# Patient Record
Sex: Male | Born: 1960 | Race: White | Hispanic: No | Marital: Married | Smoking: Former smoker
Health system: Southern US, Community
[De-identification: ages and names within clinical notes are randomized; demographics above are authoritative.]

## PROBLEM LIST (undated history)

## (undated) ENCOUNTER — Emergency Department (HOSPITAL_COMMUNITY): Admission: EM | Disposition: A | Discharge status: Home / Self Care | Payer: Self-pay

## (undated) DIAGNOSIS — M545 Low back pain, unspecified: Secondary | ICD-10-CM

## (undated) DIAGNOSIS — I43 Cardiomyopathy in diseases classified elsewhere: Secondary | ICD-10-CM

## (undated) DIAGNOSIS — I2510 Atherosclerotic heart disease of native coronary artery without angina pectoris: Secondary | ICD-10-CM

## (undated) DIAGNOSIS — E079 Disorder of thyroid, unspecified: Secondary | ICD-10-CM

## (undated) DIAGNOSIS — S0291XA Unspecified fracture of skull, initial encounter for closed fracture: Secondary | ICD-10-CM

## (undated) DIAGNOSIS — G8929 Other chronic pain: Secondary | ICD-10-CM

## (undated) DIAGNOSIS — Z9119 Patient's noncompliance with other medical treatment and regimen: Secondary | ICD-10-CM

## (undated) DIAGNOSIS — I6529 Occlusion and stenosis of unspecified carotid artery: Secondary | ICD-10-CM

## (undated) DIAGNOSIS — F1910 Other psychoactive substance abuse, uncomplicated: Secondary | ICD-10-CM

## (undated) DIAGNOSIS — I10 Essential (primary) hypertension: Secondary | ICD-10-CM

## (undated) DIAGNOSIS — M1990 Unspecified osteoarthritis, unspecified site: Secondary | ICD-10-CM

## (undated) DIAGNOSIS — Z91199 Patient's noncompliance with other medical treatment and regimen due to unspecified reason: Secondary | ICD-10-CM

## (undated) DIAGNOSIS — F419 Anxiety disorder, unspecified: Secondary | ICD-10-CM

## (undated) DIAGNOSIS — I219 Acute myocardial infarction, unspecified: Secondary | ICD-10-CM

## (undated) DIAGNOSIS — K219 Gastro-esophageal reflux disease without esophagitis: Secondary | ICD-10-CM

## (undated) DIAGNOSIS — I255 Ischemic cardiomyopathy: Secondary | ICD-10-CM

## (undated) DIAGNOSIS — I7101 Dissection of thoracic aorta: Secondary | ICD-10-CM

## (undated) DIAGNOSIS — Z9289 Personal history of other medical treatment: Secondary | ICD-10-CM

## (undated) DIAGNOSIS — I639 Cerebral infarction, unspecified: Secondary | ICD-10-CM

## (undated) DIAGNOSIS — G43909 Migraine, unspecified, not intractable, without status migrainosus: Secondary | ICD-10-CM

## (undated) DIAGNOSIS — I119 Hypertensive heart disease without heart failure: Secondary | ICD-10-CM

## (undated) HISTORY — DX: Ischemic cardiomyopathy: I25.5

## (undated) HISTORY — DX: Other psychoactive substance abuse, uncomplicated: F19.10

## (undated) HISTORY — DX: Personal history of other medical treatment: Z92.89

## (undated) HISTORY — DX: Occlusion and stenosis of unspecified carotid artery: I65.29

---
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
## 1987-01-22 HISTORY — PX: PATELLA FRACTURE SURGERY: SHX735

## 1987-01-22 HISTORY — PX: BACK SURGERY: SHX140

## 1988-09-21 DIAGNOSIS — I639 Cerebral infarction, unspecified: Secondary | ICD-10-CM

## 1988-09-21 HISTORY — DX: Cerebral infarction, unspecified: I63.9

## 1999-03-20 ENCOUNTER — Emergency Department (HOSPITAL_COMMUNITY): Admission: EM | Admit: 1999-03-20 | Discharge: 1999-03-20 | Payer: Self-pay | Admitting: *Deleted

## 1999-03-20 ENCOUNTER — Encounter: Payer: Self-pay | Admitting: Emergency Medicine

## 1999-03-20 ENCOUNTER — Encounter: Payer: Self-pay | Admitting: *Deleted

## 1999-03-27 ENCOUNTER — Emergency Department (HOSPITAL_COMMUNITY): Admission: EM | Admit: 1999-03-27 | Discharge: 1999-03-27 | Payer: Self-pay | Admitting: Emergency Medicine

## 1999-03-27 ENCOUNTER — Encounter: Payer: Self-pay | Admitting: Emergency Medicine

## 1999-05-17 ENCOUNTER — Emergency Department (HOSPITAL_COMMUNITY): Admission: EM | Admit: 1999-05-17 | Discharge: 1999-05-17 | Payer: Self-pay | Admitting: Emergency Medicine

## 1999-05-18 ENCOUNTER — Encounter: Payer: Self-pay | Admitting: Emergency Medicine

## 1999-06-04 ENCOUNTER — Encounter: Payer: Self-pay | Admitting: Emergency Medicine

## 1999-06-04 ENCOUNTER — Emergency Department (HOSPITAL_COMMUNITY): Admission: EM | Admit: 1999-06-04 | Discharge: 1999-06-04 | Payer: Self-pay | Admitting: Emergency Medicine

## 1999-09-05 ENCOUNTER — Emergency Department (HOSPITAL_COMMUNITY): Admission: EM | Admit: 1999-09-05 | Discharge: 1999-09-05 | Payer: Self-pay | Admitting: Emergency Medicine

## 1999-09-05 ENCOUNTER — Encounter: Payer: Self-pay | Admitting: Emergency Medicine

## 1999-09-19 ENCOUNTER — Emergency Department (HOSPITAL_COMMUNITY): Admission: EM | Admit: 1999-09-19 | Discharge: 1999-09-19 | Payer: Self-pay | Admitting: Emergency Medicine

## 1999-10-23 ENCOUNTER — Emergency Department (HOSPITAL_COMMUNITY): Admission: EM | Admit: 1999-10-23 | Discharge: 1999-10-23 | Payer: Self-pay | Admitting: Emergency Medicine

## 1999-11-28 ENCOUNTER — Emergency Department (HOSPITAL_COMMUNITY): Admission: EM | Admit: 1999-11-28 | Discharge: 1999-11-28 | Payer: Self-pay | Admitting: Emergency Medicine

## 1999-11-28 ENCOUNTER — Encounter: Payer: Self-pay | Admitting: Emergency Medicine

## 2000-12-30 ENCOUNTER — Emergency Department (HOSPITAL_COMMUNITY): Admission: EM | Admit: 2000-12-30 | Discharge: 2000-12-30 | Payer: Self-pay | Admitting: Emergency Medicine

## 2001-05-02 ENCOUNTER — Encounter: Payer: Self-pay | Admitting: Emergency Medicine

## 2001-05-02 ENCOUNTER — Emergency Department (HOSPITAL_COMMUNITY): Admission: EM | Admit: 2001-05-02 | Discharge: 2001-05-02 | Payer: Self-pay | Admitting: Emergency Medicine

## 2001-10-29 ENCOUNTER — Emergency Department (HOSPITAL_COMMUNITY): Admission: EM | Admit: 2001-10-29 | Discharge: 2001-10-29 | Payer: Self-pay | Admitting: *Deleted

## 2001-11-23 ENCOUNTER — Emergency Department (HOSPITAL_COMMUNITY): Admission: EM | Admit: 2001-11-23 | Discharge: 2001-11-24 | Payer: Self-pay | Admitting: Emergency Medicine

## 2001-11-24 ENCOUNTER — Encounter: Payer: Self-pay | Admitting: Emergency Medicine

## 2001-11-30 ENCOUNTER — Encounter: Payer: Self-pay | Admitting: Emergency Medicine

## 2001-11-30 ENCOUNTER — Ambulatory Visit (HOSPITAL_COMMUNITY): Admission: RE | Admit: 2001-11-30 | Discharge: 2001-11-30 | Payer: Self-pay | Admitting: Emergency Medicine

## 2001-11-30 ENCOUNTER — Emergency Department (HOSPITAL_COMMUNITY): Admission: EM | Admit: 2001-11-30 | Discharge: 2001-12-01 | Payer: Self-pay

## 2001-12-31 ENCOUNTER — Encounter: Admission: RE | Admit: 2001-12-31 | Discharge: 2002-02-01 | Payer: Self-pay | Admitting: *Deleted

## 2002-01-02 ENCOUNTER — Emergency Department (HOSPITAL_COMMUNITY): Admission: EM | Admit: 2002-01-02 | Discharge: 2002-01-02 | Payer: Self-pay | Admitting: Emergency Medicine

## 2002-01-18 ENCOUNTER — Encounter: Admission: RE | Admit: 2002-01-18 | Discharge: 2002-01-18 | Payer: Self-pay | Admitting: Internal Medicine

## 2002-01-28 ENCOUNTER — Encounter: Admission: RE | Admit: 2002-01-28 | Discharge: 2002-01-28 | Payer: Self-pay | Admitting: Internal Medicine

## 2002-02-23 ENCOUNTER — Encounter: Admission: RE | Admit: 2002-02-23 | Discharge: 2002-02-23 | Payer: Self-pay | Admitting: Internal Medicine

## 2002-03-16 ENCOUNTER — Encounter: Admission: RE | Admit: 2002-03-16 | Discharge: 2002-03-16 | Payer: Self-pay | Admitting: Internal Medicine

## 2002-03-26 ENCOUNTER — Emergency Department (HOSPITAL_COMMUNITY): Admission: EM | Admit: 2002-03-26 | Discharge: 2002-03-26 | Payer: Self-pay | Admitting: Emergency Medicine

## 2002-04-09 ENCOUNTER — Encounter: Admission: RE | Admit: 2002-04-09 | Discharge: 2002-04-09 | Payer: Self-pay | Admitting: Internal Medicine

## 2002-04-23 ENCOUNTER — Encounter: Admission: RE | Admit: 2002-04-23 | Discharge: 2002-04-23 | Payer: Self-pay | Admitting: Internal Medicine

## 2002-05-05 ENCOUNTER — Encounter: Admission: RE | Admit: 2002-05-05 | Discharge: 2002-05-05 | Payer: Self-pay | Admitting: Orthopedic Surgery

## 2002-05-05 ENCOUNTER — Encounter: Payer: Self-pay | Admitting: Orthopedic Surgery

## 2002-05-10 ENCOUNTER — Encounter: Admission: RE | Admit: 2002-05-10 | Discharge: 2002-05-10 | Payer: Self-pay | Admitting: Internal Medicine

## 2002-05-20 ENCOUNTER — Encounter: Payer: Self-pay | Admitting: Orthopedic Surgery

## 2002-05-20 ENCOUNTER — Encounter: Admission: RE | Admit: 2002-05-20 | Discharge: 2002-05-20 | Payer: Self-pay | Admitting: Orthopedic Surgery

## 2002-06-04 ENCOUNTER — Encounter: Payer: Self-pay | Admitting: Orthopedic Surgery

## 2002-06-04 ENCOUNTER — Encounter: Admission: RE | Admit: 2002-06-04 | Discharge: 2002-06-04 | Payer: Self-pay | Admitting: Orthopedic Surgery

## 2002-06-16 ENCOUNTER — Encounter: Admission: RE | Admit: 2002-06-16 | Discharge: 2002-06-16 | Payer: Self-pay | Admitting: Internal Medicine

## 2002-07-01 ENCOUNTER — Emergency Department (HOSPITAL_COMMUNITY): Admission: EM | Admit: 2002-07-01 | Discharge: 2002-07-01 | Payer: Self-pay | Admitting: Emergency Medicine

## 2002-07-07 ENCOUNTER — Encounter: Admission: RE | Admit: 2002-07-07 | Discharge: 2002-07-07 | Payer: Self-pay | Admitting: Internal Medicine

## 2002-07-07 ENCOUNTER — Encounter: Payer: Self-pay | Admitting: *Deleted

## 2002-07-07 ENCOUNTER — Emergency Department (HOSPITAL_COMMUNITY): Admission: EM | Admit: 2002-07-07 | Discharge: 2002-07-07 | Payer: Self-pay | Admitting: Emergency Medicine

## 2002-07-08 ENCOUNTER — Encounter: Admission: RE | Admit: 2002-07-08 | Discharge: 2002-07-08 | Payer: Self-pay | Admitting: Internal Medicine

## 2002-07-12 ENCOUNTER — Encounter: Admission: RE | Admit: 2002-07-12 | Discharge: 2002-07-12 | Payer: Self-pay | Admitting: Internal Medicine

## 2002-08-06 ENCOUNTER — Encounter: Admission: RE | Admit: 2002-08-06 | Discharge: 2002-08-06 | Payer: Self-pay | Admitting: Internal Medicine

## 2002-08-06 ENCOUNTER — Ambulatory Visit (HOSPITAL_COMMUNITY): Admission: RE | Admit: 2002-08-06 | Discharge: 2002-08-06 | Payer: Self-pay | Admitting: Internal Medicine

## 2002-08-20 ENCOUNTER — Encounter: Admission: RE | Admit: 2002-08-20 | Discharge: 2002-08-20 | Payer: Self-pay | Admitting: Internal Medicine

## 2002-09-09 ENCOUNTER — Encounter: Admission: RE | Admit: 2002-09-09 | Discharge: 2002-09-09 | Payer: Self-pay | Admitting: Internal Medicine

## 2002-09-22 ENCOUNTER — Encounter: Admission: RE | Admit: 2002-09-22 | Discharge: 2002-09-22 | Payer: Self-pay | Admitting: Internal Medicine

## 2002-10-08 ENCOUNTER — Emergency Department (HOSPITAL_COMMUNITY): Admission: EM | Admit: 2002-10-08 | Discharge: 2002-10-08 | Payer: Self-pay | Admitting: Emergency Medicine

## 2002-10-14 ENCOUNTER — Encounter: Payer: Self-pay | Admitting: Emergency Medicine

## 2002-10-14 ENCOUNTER — Encounter: Admission: RE | Admit: 2002-10-14 | Discharge: 2002-10-14 | Payer: Self-pay | Admitting: Internal Medicine

## 2002-10-14 ENCOUNTER — Emergency Department (HOSPITAL_COMMUNITY): Admission: EM | Admit: 2002-10-14 | Discharge: 2002-10-14 | Payer: Self-pay | Admitting: Emergency Medicine

## 2002-10-21 ENCOUNTER — Encounter: Admission: RE | Admit: 2002-10-21 | Discharge: 2002-10-21 | Payer: Self-pay | Admitting: Internal Medicine

## 2002-10-28 ENCOUNTER — Emergency Department (HOSPITAL_COMMUNITY): Admission: AD | Admit: 2002-10-28 | Discharge: 2002-10-28 | Payer: Self-pay | Admitting: Family Medicine

## 2002-11-05 ENCOUNTER — Emergency Department (HOSPITAL_COMMUNITY): Admission: AD | Admit: 2002-11-05 | Discharge: 2002-11-05 | Payer: Self-pay | Admitting: Family Medicine

## 2002-11-06 ENCOUNTER — Emergency Department (HOSPITAL_COMMUNITY): Admission: AD | Admit: 2002-11-06 | Discharge: 2002-11-06 | Payer: Self-pay | Admitting: Family Medicine

## 2002-11-18 ENCOUNTER — Encounter: Admission: RE | Admit: 2002-11-18 | Discharge: 2002-11-18 | Payer: Self-pay | Admitting: Internal Medicine

## 2002-11-18 ENCOUNTER — Emergency Department (HOSPITAL_COMMUNITY): Admission: AD | Admit: 2002-11-18 | Discharge: 2002-11-18 | Payer: Self-pay | Admitting: Family Medicine

## 2002-11-25 ENCOUNTER — Encounter: Admission: RE | Admit: 2002-11-25 | Discharge: 2002-11-25 | Payer: Self-pay | Admitting: Internal Medicine

## 2002-11-25 ENCOUNTER — Ambulatory Visit (HOSPITAL_COMMUNITY): Admission: RE | Admit: 2002-11-25 | Discharge: 2002-11-25 | Payer: Self-pay | Admitting: Internal Medicine

## 2003-01-12 ENCOUNTER — Encounter: Admission: RE | Admit: 2003-01-12 | Discharge: 2003-01-12 | Payer: Self-pay | Admitting: Internal Medicine

## 2003-02-22 ENCOUNTER — Encounter: Admission: RE | Admit: 2003-02-22 | Discharge: 2003-02-22 | Payer: Self-pay | Admitting: Internal Medicine

## 2003-08-13 ENCOUNTER — Emergency Department (HOSPITAL_COMMUNITY): Admission: AC | Admit: 2003-08-13 | Discharge: 2003-08-13 | Payer: Self-pay | Admitting: *Deleted

## 2003-08-21 ENCOUNTER — Emergency Department (HOSPITAL_COMMUNITY): Admission: EM | Admit: 2003-08-21 | Discharge: 2003-08-21 | Payer: Self-pay | Admitting: Emergency Medicine

## 2003-08-24 ENCOUNTER — Emergency Department (HOSPITAL_COMMUNITY): Admission: EM | Admit: 2003-08-24 | Discharge: 2003-08-24 | Payer: Self-pay | Admitting: Internal Medicine

## 2003-08-24 ENCOUNTER — Emergency Department (HOSPITAL_COMMUNITY): Admission: EM | Admit: 2003-08-24 | Discharge: 2003-08-24 | Payer: Self-pay | Admitting: Emergency Medicine

## 2003-12-06 ENCOUNTER — Emergency Department (HOSPITAL_COMMUNITY): Admission: RE | Admit: 2003-12-06 | Discharge: 2003-12-06 | Payer: Self-pay | Admitting: Family Medicine

## 2004-06-13 ENCOUNTER — Emergency Department (HOSPITAL_COMMUNITY): Admission: EM | Admit: 2004-06-13 | Discharge: 2004-06-13 | Payer: Self-pay | Admitting: Emergency Medicine

## 2004-10-25 ENCOUNTER — Emergency Department (HOSPITAL_COMMUNITY): Admission: EM | Admit: 2004-10-25 | Discharge: 2004-10-25 | Payer: Self-pay | Admitting: Emergency Medicine

## 2004-10-31 ENCOUNTER — Emergency Department (HOSPITAL_COMMUNITY): Admission: EM | Admit: 2004-10-31 | Discharge: 2004-10-31 | Payer: Self-pay | Admitting: Emergency Medicine

## 2005-05-31 ENCOUNTER — Ambulatory Visit (HOSPITAL_COMMUNITY): Admission: RE | Admit: 2005-05-31 | Discharge: 2005-05-31 | Payer: Self-pay | Admitting: Family Medicine

## 2005-12-12 ENCOUNTER — Emergency Department (HOSPITAL_COMMUNITY): Admission: EM | Admit: 2005-12-12 | Discharge: 2005-12-12 | Payer: Self-pay | Admitting: Emergency Medicine

## 2006-01-15 ENCOUNTER — Emergency Department (HOSPITAL_COMMUNITY): Admission: EM | Admit: 2006-01-15 | Discharge: 2006-01-15 | Payer: Self-pay | Admitting: Emergency Medicine

## 2008-06-29 ENCOUNTER — Emergency Department (HOSPITAL_COMMUNITY): Admission: EM | Admit: 2008-06-29 | Discharge: 2008-06-29 | Payer: Self-pay | Admitting: Emergency Medicine

## 2008-07-05 ENCOUNTER — Emergency Department (HOSPITAL_COMMUNITY): Admission: EM | Admit: 2008-07-05 | Discharge: 2008-07-05 | Payer: Self-pay | Admitting: Emergency Medicine

## 2008-11-05 ENCOUNTER — Ambulatory Visit: Payer: Self-pay | Admitting: Diagnostic Radiology

## 2008-11-05 ENCOUNTER — Emergency Department (HOSPITAL_BASED_OUTPATIENT_CLINIC_OR_DEPARTMENT_OTHER): Admission: EM | Admit: 2008-11-05 | Discharge: 2008-11-05 | Payer: Self-pay | Admitting: Emergency Medicine

## 2008-11-15 ENCOUNTER — Ambulatory Visit: Payer: Self-pay | Admitting: Diagnostic Radiology

## 2008-11-15 ENCOUNTER — Emergency Department (HOSPITAL_BASED_OUTPATIENT_CLINIC_OR_DEPARTMENT_OTHER): Admission: EM | Admit: 2008-11-15 | Discharge: 2008-11-15 | Payer: Self-pay | Admitting: Emergency Medicine

## 2008-12-13 ENCOUNTER — Emergency Department (HOSPITAL_COMMUNITY): Admission: EM | Admit: 2008-12-13 | Discharge: 2008-12-13 | Payer: Self-pay | Admitting: Emergency Medicine

## 2008-12-14 ENCOUNTER — Emergency Department (HOSPITAL_COMMUNITY): Admission: EM | Admit: 2008-12-14 | Discharge: 2008-12-14 | Payer: Self-pay | Admitting: Emergency Medicine

## 2009-06-06 ENCOUNTER — Emergency Department (HOSPITAL_BASED_OUTPATIENT_CLINIC_OR_DEPARTMENT_OTHER): Admission: EM | Admit: 2009-06-06 | Discharge: 2009-06-06 | Payer: Self-pay | Admitting: Emergency Medicine

## 2009-08-09 ENCOUNTER — Emergency Department (HOSPITAL_COMMUNITY): Admission: EM | Admit: 2009-08-09 | Discharge: 2009-08-09 | Payer: Self-pay | Admitting: Emergency Medicine

## 2009-09-26 ENCOUNTER — Emergency Department (HOSPITAL_BASED_OUTPATIENT_CLINIC_OR_DEPARTMENT_OTHER): Admission: EM | Admit: 2009-09-26 | Discharge: 2009-09-26 | Payer: Self-pay | Admitting: Emergency Medicine

## 2009-10-20 ENCOUNTER — Ambulatory Visit (HOSPITAL_COMMUNITY): Admission: RE | Admit: 2009-10-20 | Discharge: 2009-10-20 | Payer: Self-pay | Admitting: Urology

## 2009-10-20 HISTORY — PX: CYSTOSCOPY/RETROGRADE/URETEROSCOPY/STONE EXTRACTION WITH BASKET: SHX5317

## 2009-10-25 ENCOUNTER — Emergency Department (HOSPITAL_COMMUNITY): Admission: EM | Admit: 2009-10-25 | Discharge: 2009-10-25 | Payer: Self-pay | Admitting: Emergency Medicine

## 2010-03-13 ENCOUNTER — Emergency Department (HOSPITAL_COMMUNITY)
Admission: EM | Admit: 2010-03-13 | Discharge: 2010-03-13 | Disposition: A | Discharge status: Home / Self Care | Payer: Medicare Other | Attending: Emergency Medicine | Admitting: Emergency Medicine

## 2010-03-13 DIAGNOSIS — H5710 Ocular pain, unspecified eye: Secondary | ICD-10-CM | POA: Insufficient documentation

## 2010-03-13 DIAGNOSIS — R51 Headache: Secondary | ICD-10-CM | POA: Insufficient documentation

## 2010-03-13 DIAGNOSIS — H109 Unspecified conjunctivitis: Secondary | ICD-10-CM | POA: Insufficient documentation

## 2010-03-13 DIAGNOSIS — R197 Diarrhea, unspecified: Secondary | ICD-10-CM | POA: Insufficient documentation

## 2010-03-13 DIAGNOSIS — H5789 Other specified disorders of eye and adnexa: Secondary | ICD-10-CM | POA: Insufficient documentation

## 2010-04-04 LAB — URINE MICROSCOPIC-ADD ON

## 2010-04-04 LAB — URINE CULTURE: Colony Count: 15000

## 2010-04-04 LAB — DIFFERENTIAL
Basophils Absolute: 0 10*3/uL (ref 0.0–0.1)
Basophils Relative: 0 % (ref 0–1)
Lymphocytes Relative: 13 % (ref 12–46)
Monocytes Absolute: 0.7 10*3/uL (ref 0.1–1.0)
Neutro Abs: 8.5 10*3/uL — ABNORMAL HIGH (ref 1.7–7.7)
Neutrophils Relative %: 79 % — ABNORMAL HIGH (ref 43–77)

## 2010-04-04 LAB — COMPREHENSIVE METABOLIC PANEL
Albumin: 3.2 g/dL — ABNORMAL LOW (ref 3.5–5.2)
BUN: 17 mg/dL (ref 6–23)
Calcium: 8.7 mg/dL (ref 8.4–10.5)
Chloride: 104 mEq/L (ref 96–112)
Creatinine, Ser: 1.38 mg/dL (ref 0.4–1.5)
GFR calc non Af Amer: 55 mL/min — ABNORMAL LOW (ref >60)
Glucose, Bld: 118 mg/dL — ABNORMAL HIGH (ref 70–99)
Potassium: 3.7 mEq/L (ref 3.5–5.1)
Sodium: 138 mEq/L (ref 135–145)
Total Bilirubin: 0.6 mg/dL (ref 0.3–1.2)
Total Protein: 6.9 g/dL (ref 6.0–8.3)

## 2010-04-04 LAB — CBC
Hemoglobin: 12.8 g/dL — ABNORMAL LOW (ref 13.0–17.0)
MCH: 29.1 pg (ref 26.0–34.0)
MCV: 86.6 fL (ref 78.0–100.0)
RDW: 13.7 % (ref 11.5–15.5)

## 2010-04-04 LAB — URINALYSIS, ROUTINE W REFLEX MICROSCOPIC
Nitrite: NEGATIVE
Protein, ur: NEGATIVE mg/dL
Specific Gravity, Urine: 1.021 (ref 1.005–1.030)
Urobilinogen, UA: 1 mg/dL (ref 0.0–1.0)

## 2010-04-04 LAB — LIPASE, BLOOD: Lipase: 34 U/L (ref 11–59)

## 2010-04-05 LAB — COMPREHENSIVE METABOLIC PANEL
CO2: 26 mEq/L (ref 19–32)
Calcium: 8.7 mg/dL (ref 8.4–10.5)
Creatinine, Ser: 1 mg/dL (ref 0.4–1.5)
GFR calc Af Amer: >60 mL/min (ref >60)
GFR calc non Af Amer: >60 mL/min (ref >60)
Glucose, Bld: 96 mg/dL (ref 70–99)
Total Protein: 6.6 g/dL (ref 6.0–8.3)

## 2010-04-05 LAB — SURGICAL PCR SCREEN: Staphylococcus aureus: POSITIVE — AB

## 2010-04-05 LAB — CBC
HCT: 38.7 % — ABNORMAL LOW (ref 39.0–52.0)
Hemoglobin: 13 g/dL (ref 13.0–17.0)
MCH: 29.2 pg (ref 26.0–34.0)
MCHC: 33.7 g/dL (ref 30.0–36.0)
RDW: 14.1 % (ref 11.5–15.5)

## 2010-04-07 LAB — URINALYSIS, ROUTINE W REFLEX MICROSCOPIC
Glucose, UA: NEGATIVE mg/dL
Ketones, ur: NEGATIVE mg/dL
Leukocytes, UA: NEGATIVE
pH: 6 (ref 5.0–8.0)

## 2010-04-07 LAB — DIFFERENTIAL
Basophils Absolute: 0.1 10*3/uL (ref 0.0–0.1)
Basophils Relative: 1 % (ref 0–1)
Eosinophils Absolute: 0 10*3/uL (ref 0.0–0.7)
Monocytes Relative: 10 % (ref 3–12)
Neutrophils Relative %: 79 % — ABNORMAL HIGH (ref 43–77)

## 2010-04-07 LAB — COMPREHENSIVE METABOLIC PANEL
ALT: 20 U/L (ref 0–53)
Alkaline Phosphatase: 52 U/L (ref 39–117)
CO2: 24 mEq/L (ref 19–32)
Chloride: 108 mEq/L (ref 96–112)
GFR calc non Af Amer: 52 mL/min — ABNORMAL LOW (ref >60)
Glucose, Bld: 119 mg/dL — ABNORMAL HIGH (ref 70–99)
Potassium: 4 mEq/L (ref 3.5–5.1)
Sodium: 140 mEq/L (ref 135–145)
Total Bilirubin: 0.5 mg/dL (ref 0.3–1.2)

## 2010-04-07 LAB — CBC
HCT: 39.9 % (ref 39.0–52.0)
Hemoglobin: 13.5 g/dL (ref 13.0–17.0)
MCV: 85.8 fL (ref 78.0–100.0)
WBC: 12 10*3/uL — ABNORMAL HIGH (ref 4.0–10.5)

## 2010-04-07 LAB — URINE MICROSCOPIC-ADD ON

## 2010-04-07 LAB — POCT CARDIAC MARKERS

## 2010-04-10 ENCOUNTER — Emergency Department (HOSPITAL_COMMUNITY)
Admission: EM | Admit: 2010-04-10 | Discharge: 2010-04-10 | Disposition: A | Discharge status: Home / Self Care | Payer: Medicare Other | Attending: Emergency Medicine | Admitting: Emergency Medicine

## 2010-04-10 ENCOUNTER — Emergency Department (HOSPITAL_COMMUNITY): Payer: Medicare Other

## 2010-04-10 DIAGNOSIS — W108XXA Fall (on) (from) other stairs and steps, initial encounter: Secondary | ICD-10-CM | POA: Insufficient documentation

## 2010-04-10 DIAGNOSIS — M545 Low back pain, unspecified: Secondary | ICD-10-CM | POA: Insufficient documentation
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
## 2010-12-22 DIAGNOSIS — I219 Acute myocardial infarction, unspecified: Secondary | ICD-10-CM

## 2010-12-22 HISTORY — DX: Acute myocardial infarction, unspecified: I21.9

## 2011-01-08 ENCOUNTER — Emergency Department (HOSPITAL_COMMUNITY): Payer: Medicare Other

## 2011-01-08 ENCOUNTER — Inpatient Hospital Stay (HOSPITAL_COMMUNITY)
Admission: EM | Admit: 2011-01-08 | Discharge: 2011-01-11 | DRG: 282 | Disposition: A | Discharge status: Home / Self Care | Payer: Medicare Other | Source: Ambulatory Visit | Attending: Internal Medicine | Admitting: Internal Medicine

## 2011-01-08 ENCOUNTER — Encounter: Payer: Self-pay | Admitting: Emergency Medicine

## 2011-01-08 ENCOUNTER — Other Ambulatory Visit: Payer: Self-pay

## 2011-01-08 DIAGNOSIS — R51 Headache: Secondary | ICD-10-CM | POA: Diagnosis present

## 2011-01-08 DIAGNOSIS — F419 Anxiety disorder, unspecified: Secondary | ICD-10-CM | POA: Diagnosis present

## 2011-01-08 DIAGNOSIS — F17200 Nicotine dependence, unspecified, uncomplicated: Secondary | ICD-10-CM | POA: Diagnosis present

## 2011-01-08 DIAGNOSIS — M545 Low back pain, unspecified: Secondary | ICD-10-CM | POA: Diagnosis present

## 2011-01-08 DIAGNOSIS — I10 Essential (primary) hypertension: Secondary | ICD-10-CM | POA: Diagnosis present

## 2011-01-08 DIAGNOSIS — Z8673 Personal history of transient ischemic attack (TIA), and cerebral infarction without residual deficits: Secondary | ICD-10-CM

## 2011-01-08 DIAGNOSIS — R748 Abnormal levels of other serum enzymes: Secondary | ICD-10-CM

## 2011-01-08 DIAGNOSIS — Z720 Tobacco use: Secondary | ICD-10-CM | POA: Diagnosis present

## 2011-01-08 DIAGNOSIS — F411 Generalized anxiety disorder: Secondary | ICD-10-CM | POA: Diagnosis present

## 2011-01-08 DIAGNOSIS — E876 Hypokalemia: Secondary | ICD-10-CM | POA: Diagnosis present

## 2011-01-08 DIAGNOSIS — F1410 Cocaine abuse, uncomplicated: Secondary | ICD-10-CM | POA: Diagnosis present

## 2011-01-08 DIAGNOSIS — I200 Unstable angina: Secondary | ICD-10-CM | POA: Diagnosis present

## 2011-01-08 DIAGNOSIS — I214 Non-ST elevation (NSTEMI) myocardial infarction: Principal | ICD-10-CM | POA: Diagnosis present

## 2011-01-08 HISTORY — DX: Anxiety disorder, unspecified: F41.9

## 2011-01-08 HISTORY — DX: Essential (primary) hypertension: I10

## 2011-01-08 HISTORY — DX: Cerebral infarction, unspecified: I63.9

## 2011-01-08 LAB — COMPREHENSIVE METABOLIC PANEL
Albumin: 3.8 g/dL (ref 3.5–5.2)
BUN: 26 mg/dL — ABNORMAL HIGH (ref 6–23)
Creatinine, Ser: 0.97 mg/dL (ref 0.50–1.35)
Total Protein: 7.9 g/dL (ref 6.0–8.3)

## 2011-01-08 LAB — CBC
HCT: 41.2 % (ref 39.0–52.0)
Hemoglobin: 13.6 g/dL (ref 13.0–17.0)
MCH: 29.9 pg (ref 26.0–34.0)
MCHC: 33 g/dL (ref 30.0–36.0)

## 2011-01-08 LAB — POCT I-STAT, CHEM 8
HCT: 42 % (ref 39.0–52.0)
Hemoglobin: 14.3 g/dL (ref 13.0–17.0)
Sodium: 139 mEq/L (ref 135–145)
TCO2: 17 mmol/L (ref 0–100)

## 2011-01-08 LAB — DIFFERENTIAL
Basophils Relative: 0 % (ref 0–1)
Eosinophils Absolute: 0.2 10*3/uL (ref 0.0–0.7)
Eosinophils Relative: 2 % (ref 0–5)
Monocytes Absolute: 0.8 10*3/uL (ref 0.1–1.0)
Monocytes Relative: 8 % (ref 3–12)

## 2011-01-08 LAB — URINALYSIS, ROUTINE W REFLEX MICROSCOPIC
Leukocytes, UA: NEGATIVE
Nitrite: NEGATIVE
Specific Gravity, Urine: 1.033 — ABNORMAL HIGH (ref 1.005–1.030)
pH: 5 (ref 5.0–8.0)

## 2011-01-08 LAB — ETHANOL: Alcohol, Ethyl (B): <11 mg/dL (ref 0–11)

## 2011-01-08 LAB — CK TOTAL AND CKMB (NOT AT ARMC)
CK, MB: 2.8 ng/mL (ref 0.3–4.0)
Total CK: 143 U/L (ref 7–232)

## 2011-01-08 LAB — POCT I-STAT TROPONIN I: Troponin i, poc: 0.01 ng/mL (ref 0.00–0.08)

## 2011-01-08 LAB — TROPONIN I: Troponin I: <0.3 ng/mL (ref <0.30)

## 2011-01-08 LAB — CARDIAC PANEL(CRET KIN+CKTOT+MB+TROPI)
CK, MB: 6.5 ng/mL (ref 0.3–4.0)
Troponin I: 0.65 ng/mL (ref <0.30)

## 2011-01-08 LAB — PROTIME-INR: INR: 0.93 (ref 0.00–1.49)

## 2011-01-08 MED ORDER — ASPIRIN 81 MG PO CHEW
324.0000 mg | CHEWABLE_TABLET | Freq: Once | ORAL | Status: AC
  Administered 2011-01-08: 324 mg via ORAL

## 2011-01-08 MED ORDER — SODIUM CHLORIDE 0.9 % IV SOLN
INTRAVENOUS | Status: DC
  Administered 2011-01-08: 20 mL/h via INTRAVENOUS

## 2011-01-08 MED ORDER — NITROGLYCERIN 0.4 MG SL SUBL
0.4000 mg | SUBLINGUAL_TABLET | SUBLINGUAL | Status: DC | PRN
  Administered 2011-01-08: 0.4 mg via SUBLINGUAL
  Filled 2011-01-08: qty 25

## 2011-01-08 MED ORDER — MORPHINE SULFATE 4 MG/ML IJ SOLN
INTRAMUSCULAR | Status: AC
  Filled 2011-01-08: qty 1

## 2011-01-08 MED ORDER — SODIUM CHLORIDE 0.9 % IV SOLN: INTRAVENOUS | Status: DC

## 2011-01-08 MED ORDER — ONDANSETRON HCL 4 MG/2ML IJ SOLN
INTRAMUSCULAR | Status: AC
  Filled 2011-01-08: qty 2

## 2011-01-08 MED ORDER — HEPARIN SOD (PORCINE) IN D5W 100 UNIT/ML IV SOLN
14.0000 [IU]/kg/h | INTRAVENOUS | Status: DC
  Administered 2011-01-09: 14 [IU]/kg/h via INTRAVENOUS
  Filled 2011-01-08: qty 250

## 2011-01-08 MED ORDER — MORPHINE SULFATE 4 MG/ML IJ SOLN
4.0000 mg | Freq: Once | INTRAMUSCULAR | Status: AC
  Administered 2011-01-08: 4 mg via INTRAVENOUS
  Filled 2011-01-08 (×2): qty 1

## 2011-01-08 MED ORDER — HEPARIN BOLUS VIA INFUSION
4000.0000 [IU] | Freq: Once | INTRAVENOUS | Status: AC
  Administered 2011-01-09: 4000 [IU] via INTRAVENOUS
  Filled 2011-01-08: qty 4000

## 2011-01-08 MED ORDER — ONDANSETRON HCL 4 MG/2ML IJ SOLN
4.0000 mg | Freq: Once | INTRAMUSCULAR | Status: AC
  Administered 2011-01-08: 4 mg via INTRAVENOUS

## 2011-01-08 MED ORDER — MORPHINE SULFATE 4 MG/ML IJ SOLN
4.0000 mg | Freq: Once | INTRAMUSCULAR | Status: AC
  Administered 2011-01-08: 4 mg via INTRAVENOUS

## 2011-01-08 MED ORDER — HYDROMORPHONE HCL PF 1 MG/ML IJ SOLN
0.5000 mg | Freq: Once | INTRAMUSCULAR | Status: AC
  Administered 2011-01-08: 0.5 mg via INTRAVENOUS
  Filled 2011-01-08: qty 1

## 2011-01-08 MED ORDER — LORAZEPAM 2 MG/ML IJ SOLN
1.0000 mg | Freq: Once | INTRAMUSCULAR | Status: AC
  Administered 2011-01-08: 1 mg via INTRAVENOUS
  Filled 2011-01-08: qty 1

## 2011-01-08 MED ORDER — PANTOPRAZOLE SODIUM 40 MG IV SOLR
40.0000 mg | Freq: Once | INTRAVENOUS | Status: AC
  Administered 2011-01-08: 40 mg via INTRAVENOUS
  Filled 2011-01-08: qty 40

## 2011-01-08 MED ORDER — IOHEXOL 350 MG/ML SOLN
100.0000 mL | Freq: Once | INTRAVENOUS | Status: AC | PRN
  Administered 2011-01-08: 100 mL via INTRAVENOUS

## 2011-01-08 NOTE — ED Notes (Signed)
Alert, NAD, calm, talkative, interactive, up to b/r with supervision to void for sample, sample obtained and sent, morphine given for pain, pt to CT.

## 2011-01-08 NOTE — ED Notes (Signed)
Pt to room from CT scanner, pt placed on cardiac monitor. Repeat EKG done. Stroke team and EDP at the bedside with pt.

## 2011-01-08 NOTE — ED Notes (Signed)
Pt talking with wife on phone, c/o pain 10/10, no relief from meds, EDP aware, chest hair shaved/clipped, leads replaced, VS re-checked.

## 2011-01-08 NOTE — ED Notes (Signed)
Calm, NAD, alert, interactive, cooperative, answering multiple questions, lab at [facility].

## 2011-01-08 NOTE — ED Notes (Signed)
No changes, up to b/r, talking on phone.

## 2011-01-08 NOTE — ED Notes (Signed)
Code Stroke cancelled per Neurologist. EDP informed.

## 2011-01-08 NOTE — Progress Notes (Signed)
ANTICOAGULATION CONSULT NOTE - Initial Consult  Pharmacy Consult for heparin Indication: chest pain/ACS  Allergies  Allergen Reactions  . Penicillins     Rash     Patient Measurements: Height: 5\' 9"  (175.3 cm) Weight: 191 lb (86.637 kg) IBW/kg (Calculated) : 70.7  Adjusted Body Weight: 86.6kg  Vital Signs: Temp: 98.7 F (37.1 C) (12/18 2217) Temp src: Oral (12/18 2217) BP: 159/104 mmHg (12/18 2217) Pulse Rate: 93  (12/18 2217)  Labs:  Basename 01/08/11 2131 01/08/11 1821 01/08/11 1813  HGB -- 14.3 13.6  HCT -- 42.0 41.2  PLT -- -- 272  APTT -- -- 29  LABPROT -- -- 12.7  INR -- -- 0.93  HEPARINUNFRC -- -- --  CREATININE -- 1.30 0.97  CKTOTAL 147 -- 143  CKMB 6.5* -- 2.8  TROPONINI 0.65* -- <0.30   Estimated Creatinine Clearance: 75.8 ml/min (by C-G formula based on Cr of 1.3).  Medical History: History reviewed. No pertinent past medical history.  Medications: Aspirin 81mg /day NTG 0.4mg  PRN  Assessment: 50 yo male with CP to start heparin for concern of ACS  Goal of Therapy:  Heparin level 0.3-0.7 units/ml   Plan:  1) Heparin bolus of 4000 units followed by 1200 units/hr (14 units/kg/hr) 2) Heparin level in 6 hours and daily with CBC daily.  Austin Simmons 01/08/2011,10:44 PM

## 2011-01-08 NOTE — ED Notes (Signed)
Cardiology called per Dr Denton Lank

## 2011-01-08 NOTE — ED Notes (Signed)
Pt here with left sided weakness that started 35 minutes ago.

## 2011-01-08 NOTE — ED Provider Notes (Addendum)
History     CSN: [id number] Arrival date & time: 01/08/2011  6:10 PM   First MD Initiated Contact with Patient 01/08/11 1820      Chief Complaint  Patient presents with  . Code Stroke    (Consider location/radiation/quality/duration/timing/severity/associated sxs/prior treatment) The history is provided by the patient. The history is limited by the condition of the patient (pt not compliant w parts of history and physical).  pt very difficult historian. Moaning. C/o left chest and shoulder pain for past day. Constant. Dull. Non radiating. No specific exacerbating or alleviating factors. No positional change or change w activity or exertion. No assoc sob, nv, or diaphoresis. Unsure if has had previously. States hx heart attack and stroke but cant recall any specifics and/or symptoms regarding those prior diagnoses. Denies cough or uri c/o. No fever or chills. No radiation of pain or back pain. Denies trauma, fall or strain.   History reviewed. No pertinent past medical history.  Past Surgical History  Procedure Date  . Rib fracture surgery   . Rotator cuff repair     No family history on file.  History  Substance Use Topics  . Smoking status: Current Everyday Smoker  . Smokeless tobacco: Not on file  . Alcohol Use: No      Review of Systems  Constitutional: Negative for fever.  HENT: Negative for neck pain.   Eyes: Negative for redness.  Respiratory: Negative for shortness of breath.   Cardiovascular: Positive for chest pain. Negative for leg swelling.  Gastrointestinal: Negative for abdominal pain.  Genitourinary: Negative for flank pain.  Musculoskeletal: Negative for back pain.  Skin: Negative for rash.  Neurological: Negative for headaches.  Hematological: Does not bruise/bleed easily.  Psychiatric/Behavioral: The patient is nervous/anxious.     Allergies  Review of patient's allergies indicates not on file.  Home Medications  No current outpatient  prescriptions on file.  BP 162/113  Pulse 101  Temp(Src) 96.8 F (36 C) (Oral)  Resp 22  SpO2 99%  Physical Exam  Nursing note and vitals reviewed. Constitutional: He appears well-developed and well-nourished.  HENT:  Head: Atraumatic.  Eyes: Pupils are equal, round, and reactive to light. No scleral icterus.  Neck: Normal range of motion. Neck supple. No JVD present. No tracheal deviation present.  Cardiovascular: Normal rate, regular rhythm, normal heart sounds and intact distal pulses.  Exam reveals no gallop and no friction rub.   No murmur heard. Pulmonary/Chest: Effort normal and breath sounds normal. No accessory muscle usage. No respiratory distress. He exhibits tenderness.  Abdominal: Soft. He exhibits no distension and no mass. There is no tenderness. There is no rebound and no guarding.  Musculoskeletal: Normal range of motion. He exhibits no edema and no tenderness.  Neurological: He is alert.       Awake and alert. Answers some questions. Speech fluent. Moves bil extremities purposefully, non compliant w focused neuro exam.   Skin: Skin is warm and dry.    ED Course  Procedures (including critical care time)  Labs Reviewed  POCT I-STAT, CHEM 8 - Abnormal; Notable for the following:    BUN 29 (*)    Glucose, Bld 121 (*)    Calcium, Ion 1.02 (*)    All other components within normal limits  POCT I-STAT TROPONIN I  PROTIME-INR  APTT  CBC  DIFFERENTIAL  COMPREHENSIVE METABOLIC PANEL  CK TOTAL AND CKMB  TROPONIN I  URINE RAPID DRUG SCREEN (HOSP PERFORMED)  ETHANOL    Results for  orders placed during the hospital encounter of 01/08/11  PROTIME-INR      Component Value Range   Prothrombin Time 12.7  11.6 - 15.2 (seconds)   INR 0.93  0.00 - 1.49   APTT      Component Value Range   aPTT 29  24 - 37 (seconds)  CBC      Component Value Range   WBC 10.8 (*) 4.0 - 10.5 (K/uL)   RBC 4.55  4.22 - 5.81 (MIL/uL)   Hemoglobin 13.6  13.0 - 17.0 (g/dL)   HCT 40.9   81.1 - 91.4 (%)   MCV 90.5  78.0 - 100.0 (fL)   MCH 29.9  26.0 - 34.0 (pg)   MCHC 33.0  30.0 - 36.0 (g/dL)   RDW 78.2  95.6 - 21.3 (%)   Platelets 272  150 - 400 (K/uL)  DIFFERENTIAL      Component Value Range   Neutrophils Relative 50  43 - 77 (%)   Neutro Abs 5.4  1.7 - 7.7 (K/uL)   Lymphocytes Relative 40  12 - 46 (%)   Lymphs Abs 4.4 (*) 0.7 - 4.0 (K/uL)   Monocytes Relative 8  3 - 12 (%)   Monocytes Absolute 0.8  0.1 - 1.0 (K/uL)   Eosinophils Relative 2  0 - 5 (%)   Eosinophils Absolute 0.2  0.0 - 0.7 (K/uL)   Basophils Relative 0  0 - 1 (%)   Basophils Absolute 0.0  0.0 - 0.1 (K/uL)  COMPREHENSIVE METABOLIC PANEL      Component Value Range   Sodium 137  135 - 145 (mEq/L)   Potassium 3.9  3.5 - 5.1 (mEq/L)   Chloride 101  96 - 112 (mEq/L)   CO2 24  19 - 32 (mEq/L)   Glucose, Bld 110 (*) 70 - 99 (mg/dL)   BUN 26 (*) 6 - 23 (mg/dL)   Creatinine, Ser 0.86  0.50 - 1.35 (mg/dL)   Calcium 9.0  8.4 - 57.8 (mg/dL)   Total Protein 7.9  6.0 - 8.3 (g/dL)   Albumin 3.8  3.5 - 5.2 (g/dL)   AST 28  0 - 37 (U/L)   ALT 29  0 - 53 (U/L)   Alkaline Phosphatase 53  39 - 117 (U/L)   Total Bilirubin 0.1 (*) 0.3 - 1.2 (mg/dL)   GFR calc non Af Amer >90  >90 (mL/min)   GFR calc Af Amer >90  >90 (mL/min)  CK TOTAL AND CKMB      Component Value Range   Total CK 143  7 - 232 (U/L)   CK, MB 2.8  0.3 - 4.0 (ng/mL)   Relative Index 2.0  0.0 - 2.5   TROPONIN I      Component Value Range   Troponin I <0.30  <0.30 (ng/mL)  POCT I-STAT, CHEM 8      Component Value Range   Sodium 139  135 - 145 (mEq/L)   Potassium 3.8  3.5 - 5.1 (mEq/L)   Chloride 107  96 - 112 (mEq/L)   BUN 29 (*) 6 - 23 (mg/dL)   Creatinine, Ser 4.69  0.50 - 1.35 (mg/dL)   Glucose, Bld 629 (*) 70 - 99 (mg/dL)   Calcium, Ion 5.28 (*) 1.12 - 1.32 (mmol/L)   TCO2 17  0 - 100 (mmol/L)   Hemoglobin 14.3  13.0 - 17.0 (g/dL)   HCT 41.3  24.4 - 01.0 (%)  POCT I-STAT TROPONIN I  Component Value Range   Troponin i, poc 0.01   0.00 - 0.08 (ng/mL)   Comment 3           URINE RAPID DRUG SCREEN (HOSP PERFORMED)      Component Value Range   Opiates POSITIVE (*) NONE DETECTED    Cocaine POSITIVE (*) NONE DETECTED    Benzodiazepines POSITIVE (*) NONE DETECTED    Amphetamines NONE DETECTED  NONE DETECTED    Tetrahydrocannabinol POSITIVE (*) NONE DETECTED    Barbiturates NONE DETECTED  NONE DETECTED   ETHANOL      Component Value Range   Alcohol, Ethyl (B) <11  0 - 11 (mg/dL)  URINALYSIS, ROUTINE W REFLEX MICROSCOPIC      Component Value Range   Color, Urine YELLOW  YELLOW    APPearance CLEAR  CLEAR    Specific Gravity, Urine 1.033 (*) 1.005 - 1.030    pH 5.0  5.0 - 8.0    Glucose, UA NEGATIVE  NEGATIVE (mg/dL)   Hgb urine dipstick NEGATIVE  NEGATIVE    Bilirubin Urine NEGATIVE  NEGATIVE    Ketones, ur NEGATIVE  NEGATIVE (mg/dL)   Protein, ur NEGATIVE  NEGATIVE (mg/dL)   Urobilinogen, UA 0.2  0.0 - 1.0 (mg/dL)   Nitrite NEGATIVE  NEGATIVE    Leukocytes, UA NEGATIVE  NEGATIVE   CARDIAC PANEL(CRET KIN+CKTOT+MB+TROPI)      Component Value Range   Total CK 147  7 - 232 (U/L)   CK, MB 6.5 (*) 0.3 - 4.0 (ng/mL)   Troponin I 0.65 (*) <0.30 (ng/mL)   Relative Index 4.4 (*) 0.0 - 2.5    Ct Head Wo Contrast  01/08/2011  *RADIOLOGY REPORT*  Clinical Data: Code stroke, left arm tingling  CT HEAD WITHOUT CONTRAST  Technique:  Contiguous axial images were obtained from the base of the skull through the vertex without contrast.  Comparison: [facility] CT head dated 06/29/2008  Findings: No evidence of parenchymal hemorrhage or extra-axial fluid collection. No mass lesion, mass effect, or midline shift.  No CT evidence of acute infarction.  Mild subcortical white matter and periventricular small vessel ischemic changes.  Cerebral volume is age appropriate.  No ventriculomegaly.  Mucosal thickening in the right maxillary sinus.  Visualized paranasal sinuses and mastoid air cells are otherwise clear.  No evidence of calvarial  fracture.  IMPRESSION: No evidence of acute intracranial abnormality.  Mild small vessel ischemic changes.  Original Report Authenticated By: Charline Bills, M.D.   Ct Angio Chest W/cm &/or Wo Cm  01/08/2011  *RADIOLOGY REPORT*  Clinical Data:  Chest pain  CT ANGIOGRAPHY CHEST WITH CONTRAST  Technique:  Multidetector CT imaging of the chest was performed using the standard protocol during bolus administration of intravenous contrast.  Multiplanar CT image reconstructions including MIPs were obtained to evaluate the vascular anatomy.  Contrast: OMNIPAQUE IOHEXOL 350 MG/ML IV SOLN  Comparison:  11/15/2008 radiograph  Findings:  No pulmonary arterial branch filling defect.  Normal course and caliber aorta.  No dissection.  Heart size upper normal limits to mildly enlarged. Coronary artery calcification.  No pericardial effusion.  No pleural effusion.  No intrathoracic lymphadenopathy.  Limited images through the upper abdomen show no acute abnormality.  Central airways are patent.  Dependent bibasilar atelectasis.  No pneumothorax.  No acute osseous abnormality.  Review of the MIP images confirms the above findings.  IMPRESSION: No pulmonary embolism, aortic dissection, or acute intrathoracic process.  Coronary artery calcification.  Original Report Authenticated By: Greig Castilla  [facility], M.D.      MDM  Ecg. Labs. C/o cp. Asa.    Date: 01/08/2011  Rate: 98  Rhythm: normal sinus rhythm  QRS Axis: normal  Intervals: normal  ST/T Wave abnormalities: nonspecific ST/T changes  Conduction Disutrbances:lvh  Narrative Interpretation:   Old EKG Reviewed: unchanged   Pt requests pain med. States no change w ntg pta.  Morphine iv. zofran iv.   2nd cardiac marker elevated. Recheck no chest pain.  Hep iv.   Card called.   Discussed w dr Mayford Knife, cardiology, including cocaine pos, elevated trop and ckmb - indicates admit to med service.  Discussed w Dr Toniann Fail incl elevated trop, cocaine pos,  and discussion w card - states admit to team 8, step down.     Suzi Roots, MD 01/08/11 1610  Suzi Roots, MD 01/08/11 [phone]

## 2011-01-08 NOTE — ED Notes (Signed)
No changes, alert, NAD, calm, back on cell phone, rates pain 9/10, 2nd dose of morphine "did not help at all", pt explaining events of evening and health care information on phone multiple times, relaying, PTA: "my heart stopped, needed mouth to mouth, rolaids and soda did not help sx", requesting crackers, NPO explained until tests resulted, pt agreeable.

## 2011-01-09 ENCOUNTER — Encounter (HOSPITAL_COMMUNITY): Payer: Self-pay | Admitting: Internal Medicine

## 2011-01-09 LAB — COMPREHENSIVE METABOLIC PANEL
ALT: 29 U/L (ref 0–53)
AST: 35 U/L (ref 0–37)
Alkaline Phosphatase: 46 U/L (ref 39–117)
CO2: 29 mEq/L (ref 19–32)
Chloride: 106 mEq/L (ref 96–112)
GFR calc non Af Amer: >90 mL/min (ref >90)
Potassium: 4.1 mEq/L (ref 3.5–5.1)
Sodium: 141 mEq/L (ref 135–145)
Total Bilirubin: 0.3 mg/dL (ref 0.3–1.2)

## 2011-01-09 LAB — LIPASE, BLOOD: Lipase: 74 U/L — ABNORMAL HIGH (ref 11–59)

## 2011-01-09 LAB — CARDIAC PANEL(CRET KIN+CKTOT+MB+TROPI)
CK, MB: 31.3 ng/mL (ref 0.3–4.0)
CK, MB: 30.9 ng/mL (ref 0.3–4.0)
Relative Index: 9 — ABNORMAL HIGH (ref 0.0–2.5)
Relative Index: 7 — ABNORMAL HIGH (ref 0.0–2.5)
Total CK: 349 U/L — ABNORMAL HIGH (ref 7–232)
Total CK: 343 U/L — ABNORMAL HIGH (ref 7–232)

## 2011-01-09 LAB — MRSA PCR SCREENING: MRSA by PCR: POSITIVE — AB

## 2011-01-09 LAB — CBC
Hemoglobin: 12.4 g/dL — ABNORMAL LOW (ref 13.0–17.0)
Platelets: 262 10*3/uL (ref 150–400)
RBC: 4.27 MIL/uL (ref 4.22–5.81)
WBC: 10.8 10*3/uL — ABNORMAL HIGH (ref 4.0–10.5)

## 2011-01-09 MED ORDER — ACETAMINOPHEN 650 MG RE SUPP: 650.0000 mg | Freq: Four times a day (QID) | RECTAL | Status: DC | PRN

## 2011-01-09 MED ORDER — HEPARIN SOD (PORCINE) IN D5W 100 UNIT/ML IV SOLN
1700.0000 [IU]/h | INTRAVENOUS | Status: DC
  Administered 2011-01-10 (×2): 1700 [IU]/h via INTRAVENOUS
  Filled 2011-01-09 (×2): qty 250

## 2011-01-09 MED ORDER — INFLUENZA VIRUS VACC SPLIT PF IM SUSP
0.5000 mL | INTRAMUSCULAR | Status: AC
  Administered 2011-01-10: 0.5 mL via INTRAMUSCULAR
  Filled 2011-01-09: qty 0.5

## 2011-01-09 MED ORDER — NITROGLYCERIN 0.4 MG/HR TD PT24
0.4000 mg | MEDICATED_PATCH | Freq: Every day | TRANSDERMAL | Status: DC
  Administered 2011-01-09 – 2011-01-10 (×2): 0.4 mg via TRANSDERMAL
  Filled 2011-01-09 (×4): qty 1

## 2011-01-09 MED ORDER — ACETAMINOPHEN 325 MG PO TABS
650.0000 mg | ORAL_TABLET | Freq: Four times a day (QID) | ORAL | Status: DC | PRN
  Administered 2011-01-09 (×2): 650 mg via ORAL
  Filled 2011-01-09 (×2): qty 2

## 2011-01-09 MED ORDER — MORPHINE SULFATE 2 MG/ML IJ SOLN
1.0000 mg | INTRAMUSCULAR | Status: DC | PRN
  Administered 2011-01-09 – 2011-01-10 (×4): 1 mg via INTRAVENOUS
  Filled 2011-01-09 (×5): qty 1

## 2011-01-09 MED ORDER — ONDANSETRON HCL 4 MG/2ML IJ SOLN: 4.0000 mg | Freq: Four times a day (QID) | INTRAMUSCULAR | Status: DC | PRN

## 2011-01-09 MED ORDER — HEPARIN BOLUS VIA INFUSION
1500.0000 [IU] | Freq: Once | INTRAVENOUS | Status: AC
  Administered 2011-01-09: 1500 [IU] via INTRAVENOUS
  Filled 2011-01-09: qty 1500

## 2011-01-09 MED ORDER — ASPIRIN 325 MG PO TABS
325.0000 mg | ORAL_TABLET | Freq: Every day | ORAL | Status: DC
  Administered 2011-01-09 – 2011-01-11 (×3): 325 mg via ORAL
  Filled 2011-01-09 (×4): qty 1

## 2011-01-09 MED ORDER — ONDANSETRON HCL 4 MG PO TABS: 4.0000 mg | ORAL_TABLET | Freq: Four times a day (QID) | ORAL | Status: DC | PRN

## 2011-01-09 MED ORDER — MORPHINE SULFATE 2 MG/ML IJ SOLN
1.0000 mg | INTRAMUSCULAR | Status: DC | PRN
  Administered 2011-01-09 (×2): 1 mg via INTRAVENOUS
  Filled 2011-01-09 (×3): qty 1

## 2011-01-09 MED ORDER — MUPIROCIN 2 % EX OINT
1.0000 "application " | TOPICAL_OINTMENT | Freq: Two times a day (BID) | CUTANEOUS | Status: DC
  Administered 2011-01-09 – 2011-01-10 (×3): 1 via NASAL
  Filled 2011-01-09 (×2): qty 22

## 2011-01-09 MED ORDER — LORAZEPAM 2 MG/ML IJ SOLN
0.5000 mg | INTRAMUSCULAR | Status: DC | PRN
  Administered 2011-01-10: 0.5 mg via INTRAVENOUS
  Filled 2011-01-09: qty 1

## 2011-01-09 MED ORDER — SODIUM CHLORIDE 0.9 % IV SOLN
INTRAVENOUS | Status: DC
  Administered 2011-01-09 – 2011-01-10 (×3): via INTRAVENOUS

## 2011-01-09 MED ORDER — CHLORHEXIDINE GLUCONATE CLOTH 2 % EX PADS
6.0000 | MEDICATED_PAD | Freq: Every day | CUTANEOUS | Status: DC
  Administered 2011-01-11: 6 via TOPICAL

## 2011-01-09 MED ORDER — ROSUVASTATIN CALCIUM 20 MG PO TABS
20.0000 mg | ORAL_TABLET | Freq: Every day | ORAL | Status: DC
  Administered 2011-01-09 – 2011-01-10 (×2): 20 mg via ORAL
  Filled 2011-01-09 (×3): qty 1

## 2011-01-09 MED ORDER — HEPARIN SOD (PORCINE) IN D5W 100 UNIT/ML IV SOLN
1300.0000 [IU]/h | INTRAVENOUS | Status: DC
  Filled 2011-01-09: qty 250

## 2011-01-09 MED ORDER — LORAZEPAM 2 MG/ML IJ SOLN
1.0000 mg | INTRAMUSCULAR | Status: DC | PRN
  Administered 2011-01-09: 1 mg via INTRAVENOUS
  Filled 2011-01-09: qty 1

## 2011-01-09 NOTE — Progress Notes (Signed)
ANTICOAGULATION CONSULT NOTE - Follow-up Consult  Pharmacy Consult for heparin Indication: chest pain/ACS  Allergies  Allergen Reactions  . Penicillins     Rash     Patient Measurements: Height: 5\' 8"  (172.7 cm) Weight: 186 lb 4.6 oz (84.5 kg) IBW/kg (Calculated) : 68.4  Adjusted Body Weight: 86.6kg  Vital Signs: Temp: 97.6 F (36.4 C) (12/19 0322) Temp src: Oral (12/19 0322) BP: 163/117 mmHg (12/19 0322) Pulse Rate: 73  (12/19 0322)  Labs:  Basename 01/09/11 0455 01/08/11 2131 01/08/11 1821 01/08/11 1813  HGB 12.4* -- 14.3 --  HCT 38.8* -- 42.0 41.2  PLT 262 -- -- 272  APTT -- -- -- 29  LABPROT -- -- -- 12.7  INR -- -- -- 0.93  HEPARINUNFRC 0.33 -- -- --  CREATININE 0.95 -- 1.30 0.97  CKTOTAL 349* 147 -- 143  CKMB 31.3* 6.5* -- 2.8  TROPONINI 5.42* 0.65* -- <0.30   Assessment: 50 yo male with CP for heparin.  Goal of Therapy:  Heparin level 0.3-0.7 units/ml   Plan:  Increase Heparin 1300 units/hr to keep in range.  F/U plan  Eddie Candle 01/09/2011,6:38 AM

## 2011-01-09 NOTE — Progress Notes (Signed)
ANTICOAGULATION CONSULT NOTE - Follow Up Consult  Pharmacy Consult for Heparin Indication: chest pain/ACS  Allergies  Allergen Reactions  . Penicillins     Rash     Patient Measurements: Height: 5\' 8"  (172.7 cm) Weight: 186 lb 4.6 oz (84.5 kg) IBW/kg (Calculated) : 68.4  Adjusted Body Weight:   Vital Signs: Temp: 98.1 F (36.7 C) (12/19 1239) Temp src: Oral (12/19 1239) BP: 132/74 mmHg (12/19 1239) Pulse Rate: 74  (12/19 1239)  Labs:  Basename 01/09/11 1320 01/09/11 0455 01/08/11 2131 01/08/11 1821 01/08/11 1813  HGB -- 12.4* -- 14.3 --  HCT -- 38.8* -- 42.0 41.2  PLT -- 262 -- -- 272  APTT -- -- -- -- 29  LABPROT -- -- -- -- 12.7  INR -- -- -- -- 0.93  HEPARINUNFRC 0.22* 0.33 -- -- --  CREATININE -- 0.95 -- 1.30 0.97  CKTOTAL 343* 349* 147 -- --  CKMB 30.9* 31.3* 6.5* -- --  TROPONINI 5.65* 5.42* 0.65* -- --   Estimated Creatinine Clearance: 100.6 ml/min (by C-G formula based on Cr of 0.95).   Medications:  Heparin 1300 units/hr  Assessment: 50yom on heparin for ACS/CP. Heparin level (0.22) is subtherapeutic. RN reports no problems with infusion. - H/H and Plts wnl - No significant bleeding complications reported  Goal of Therapy:  Heparin level 0.3-0.7 units/ml   Plan:  1. Heparin IV bolus 1500 units x 1 2. Increase heparin drip to 1500 units/hr (15 ml/hr) 3. Check heparin level 6hrs after rate increase  Cleon Dew [phone] 01/09/2011,2:59 PM

## 2011-01-09 NOTE — Consults (Signed)
Admit date: 01/08/2011 Referring Physician  Dr. Sharon Seller Primary Physician No primary provider on file. 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 Deckers factors include cocaine, smoking, prior stroke in [place], hypertension. He is unaware of his lipid status. He states that he is nondiabetic. His father had a heart attack at age 74.  He states that he hasn't eaten for 2 days and he had chest pain off and on since yesterday which brought him to the emergency room. He is still continuing to have stuttering chest pain intermittently through the day that is quite severe he states substernal/left of sternal border with no radiation, no associated diaphoresis, no associated shortness of breath. He states that he has not had a myocardial infarction previous to this.  In review of his H&P he states that after church he reached home and had severe sudden chest pain and approximally for p.m. and lasted for 5 minutes. CAT scan was done to rule out pulmonary embolism but he did have some mild coronary calcification in the mid LAD at the first diagonal branch. He is currently chest pain-free. Urinary tox screen was positive for marijuana as well as cocaine.  Currently, troponin 5.65, MB of 31, CK of 349. TSH is 25.   PMH:   Past Medical History  Diagnosis Date  . Low back pain   . Anxiety   . Hypertension   . Stroke     PSH:   Past Surgical History  Procedure Date  . Rib fracture surgery   . Rotator cuff repair    Allergies:  Penicillins Prior to Admit Meds:   Prescriptions prior to admission  Medication Sig Dispense Refill  . aspirin 81 MG chewable tablet Chew 81 mg by mouth daily.        . nitroGLYCERIN (NITROSTAT) 0.4 MG SL tablet Place 0.4 mg under the tongue every 5 (five) minutes as needed. Chest pain         Fam HX:    History reviewed. No pertinent family history. Social HX:    History   Social History  . Marital Status: Divorced    Spouse Name: N/A    Number of Children: N/A  . Years of Education: N/A   Occupational History  . Not on file.   Social History Main Topics  . Smoking status: Current Everyday Smoker -- 1.0 packs/day for 13 years    Types: Cigarettes  . Smokeless tobacco: Never Used  . Alcohol Use: No  . Drug Use: No  . Sexually Active:    Other Topics Concern  . Not on file   Social History Narrative  . No narrative on file     ROS:  All 11 ROS were addressed and are negative except what is stated in the HPI  Physical Exam: Blood pressure 132/74, pulse 74, temperature 98.1 F (36.7 C), temperature source Oral, resp. rate 16, height 5\' 8"  (1.727 m), weight 84.5 kg (186 lb 4.6 oz), SpO2 100.00%.    General: Well developed, well nourished, in no acute distress, disheveled appearance Head: Eyes PERRLA, No xanthomas.   Normal cephalic and atramatic  Lungs:   Clear bilaterally to auscultation and percussion. Normal respiratory effort. No wheezes, no rales. Heart:   HRRR S1 S2 Pulses are 2+ & equal.            No carotid bruit. No JVD.  No abdominal bruits.  No femoral bruits. Abdomen: Bowel sounds are positive, abdomen soft and non-tender without masses or                  Hernia's noted. No hepatosplenomegaly. Msk:  Back normal, normal gait. Normal strength and tone for age. Extremities:   No clubbing, cyanosis or edema.  DP +1 Neuro: Alert and oriented X 3, non-focal, MAE x 4 GU: Deferred Rectal: Deferred Psych:  Appeared appropriately answering questions.    Labs:   Lab Results  Component Value Date   WBC 10.8* 01/09/2011   HGB 12.4* 01/09/2011   HCT 38.8* 01/09/2011   MCV 90.9 01/09/2011   PLT 262 01/09/2011    Lab 01/09/11 0455  NA 141  K 4.1  CL 106  CO2 29  BUN 17  CREATININE 0.95  CALCIUM 8.6  PROT 6.9  BILITOT 0.3  ALKPHOS 46  ALT 29  AST 35    GLUCOSE 98   No results found for this basename: PTT   Lab Results  Component Value Date   INR 0.93 01/08/2011   Lab Results  Component Value Date   CKTOTAL 343* 01/09/2011   CKMB 30.9* 01/09/2011   TROPONINI 5.65* 01/09/2011      Radiology:  Ct Head Wo Contrast  01/08/2011  *RADIOLOGY REPORT*  Clinical Data: Code stroke, left arm tingling  CT HEAD WITHOUT CONTRAST  Technique:  Contiguous axial images were obtained from the base of the skull through the vertex without contrast.  Comparison: [facility] CT head dated 06/29/2008  Findings: No evidence of parenchymal hemorrhage or extra-axial fluid collection. No mass lesion, mass effect, or midline shift.  No CT evidence of acute infarction.  Mild subcortical white matter and periventricular small vessel ischemic changes.  Cerebral volume is age appropriate.  No ventriculomegaly.  Mucosal thickening in the right maxillary sinus.  Visualized paranasal sinuses and mastoid air cells are otherwise clear.  No evidence of calvarial fracture.  IMPRESSION: No evidence of acute intracranial abnormality.  Mild small vessel ischemic changes.  Original Report Authenticated By: Charline Bills, M.D.   Ct Angio Chest W/cm &/or Wo Cm  01/08/2011  *RADIOLOGY REPORT*  Clinical Data:  Chest pain  CT ANGIOGRAPHY CHEST WITH CONTRAST  Technique:  Multidetector CT imaging of the chest was performed using the standard protocol during bolus administration of intravenous contrast.  Multiplanar CT image reconstructions including MIPs were obtained to evaluate the vascular anatomy.  Contrast: OMNIPAQUE IOHEXOL 350 MG/ML IV SOLN  Comparison:  11/15/2008 radiograph  Findings:  No pulmonary arterial branch filling defect.  Normal course and caliber aorta.  No dissection.  Heart size upper normal limits to mildly enlarged. Coronary artery calcification.  No pericardial effusion.  No pleural effusion.  No intrathoracic lymphadenopathy.  Limited images through the upper  abdomen show no acute abnormality.  Central airways are patent.  Dependent bibasilar atelectasis.  No pneumothorax.  No acute osseous abnormality.  Review of the MIP images confirms the above findings.  IMPRESSION: No pulmonary embolism, aortic dissection, or acute intrathoracic process.  Coronary artery calcification.  Original Report Authenticated By: Waneta Martins, M.D.   CT scan personally viewed shows mid LAD calcification. This appears mild. Proximal vessels, proximal left main, proximal RCA as well as proximal LAD do appear patent although CT scan was not gated for coronary study.  EKG:  ST segment appears normal, no segment elevation. No Q waves. Sinus rhythm. Personally viewed.  ASSESSMENT/PLAN:    Non-ST elevation myocardial infarction/acute  myocardial infarction-cocaine induced. Avoid beta blockers. If he has ongoing chest pain, nitroglycerin IV would be helpful.  For now, continue with heparin IV and continued to trend cardiac markers, and continue nitroglycerin patch. I am going to avoid nicotinic patch during the acute MI setting. Statin would be helpful. Aspirin. Echocardiogram has been done and is currently awaiting interpretation. At this point, given his cocaine positive status, he is not a catheterization lab candidate unless he had unrelenting chest pain with ST segment elevation despite nitroglycerin or calcium channel blocker. He may be a candidate for a stress test for further risk stratification. He will continue to follow. Thyroid function should be followed. TSH was elevated.  Tobacco cessation as well as drug counseling should take place.  Control HTN.   We will follow.   [name], MD  01/09/2011  2:47 PM

## 2011-01-09 NOTE — Progress Notes (Signed)
ANTICOAGULATION CONSULT NOTE - Follow Up Consult  Pharmacy Consult for Heparin Indication: chest pain/ACS  Allergies  Allergen Reactions  . Penicillins     Rash     Patient Measurements: Height: 5\' 8"  (172.7 cm) Weight: 186 lb 4.6 oz (84.5 kg) IBW/kg (Calculated) : 68.4  Adjusted Body Weight:   Vital Signs: Temp: 97.8 F (36.6 C) (12/19 2000) Temp src: Oral (12/19 2000) BP: 131/86 mmHg (12/19 2000) Pulse Rate: 88  (12/19 2000)  Labs:  [facility] 01/09/11 2141 01/09/11 2134 01/09/11 1320 01/09/11 0455 01/08/11 1821 01/08/11 1813  HGB -- -- -- 12.4* 14.3 --  HCT -- -- -- 38.8* 42.0 41.2  PLT -- -- -- 262 -- 272  APTT -- -- -- -- -- 29  LABPROT -- -- -- -- -- 12.7  INR -- -- -- -- -- 0.93  HEPARINUNFRC -- 0.27* 0.22* 0.33 -- --  CREATININE -- -- -- 0.95 1.30 0.97  CKTOTAL 279* -- 343* 349* -- --  CKMB 19.4* -- 30.9* 31.3* -- --  TROPONINI 4.45* -- 5.65* 5.42* -- --   Estimated Creatinine Clearance: 100.6 ml/min (by C-G formula based on Cr of 0.95).  Assessment: 50 yom on with ACS for Heparin  Goal of Therapy:  Heparin level 0.3-0.7 units/ml   Plan:  Increase Heparin 1700 units/hr   Eddie Candle 01/09/2011,11:01 PM

## 2011-01-09 NOTE — Progress Notes (Signed)
Utilization Review Completed.Austin Simmons T12/19/2012   

## 2011-01-09 NOTE — H&P (Addendum)
Austin Simmons is an 50 y.o. male.   [facility]. Chief Complaint: Chest pain dear HPI: 50 year-old male with history of chronic low back pain and anxiety had gone to the church and on his way when he reached home he had sudden severe chest pain. It happened around 4 PM and it lasted for 5 minutes. The chest pain is more on the left anterior chest wall has the same time he had feeling of numbness in his left upper extremity. His son brought him to the ER patient had cardiac enzymes and EKG and also CAT scan chest rule out PE or dissection. A CT chest was negative for dissection but his troponin is mildly elevated and EKG not showing any specific. His drug screen is positive for cocaine. Cardiologist on call Dr. Carolanne Grumbling was consulted by ER physician. At this time Dr. Carolanne Grumbling deferred admission to hospitalist. Patient presently chest pain-free after receiving morphine and Ativan. Denies any nausea vomiting abdominal pain or diarrhea. Denies any focal deficits headache or visual symptoms. His left upper the numbness is completely resolved.  Past Medical History  Diagnosis Date  . Low back pain   . Anxiety     Past Surgical History  Procedure Date  . Rib fracture surgery   . Rotator cuff repair     History reviewed. No pertinent family history. Social History:  reports that he has been smoking.  He does not have any smokeless tobacco history on file. He reports that he does not drink alcohol or use illicit drugs.  Allergies:  Allergies  Allergen Reactions  . Penicillins     Rash     Medications Prior to Admission  Medication Dose Route Frequency Provider Last Rate Last Dose  . 0.9 %  sodium chloride infusion   Intravenous Continuous Suzi Roots, MD 20 mL/hr at 01/08/11 1846 20 mL/hr at 01/08/11 1846  . 0.9 %  sodium chloride infusion   Intravenous STAT Suzi Roots, MD      . aspirin chewable tablet 324 mg  324 mg Oral Once Suzi Roots, MD   324 mg at  01/08/11 1610  . heparin ADULT infusion 100 units/ml (25000 units/250 ml)  14 Units/kg/hr Intravenous Continuous Suzi Roots, MD      . heparin bolus via infusion 4,000 Units  4,000 Units Intravenous Once Suzi Roots, MD      . HYDROmorphone (DILAUDID) injection 0.5 mg  0.5 mg Intravenous Once Suzi Roots, MD   0.5 mg at 01/08/11 2303  . iohexol (OMNIPAQUE) 350 MG/ML injection 100 mL  100 mL Intravenous Once PRN Medication Radiologist   100 mL at 01/08/11 2116  . LORazepam (ATIVAN) injection 1 mg  1 mg Intravenous Once Suzi Roots, MD   1 mg at 01/08/11 1951  . LORazepam (ATIVAN) injection 1 mg  1 mg Intravenous Once Suzi Roots, MD   1 mg at 01/08/11 2303  . morphine 4 MG/ML injection 4 mg  4 mg Intravenous Once Suzi Roots, MD   4 mg at 01/08/11 1840  . morphine 4 MG/ML injection 4 mg  4 mg Intravenous Once Suzi Roots, MD   4 mg at 01/08/11 2035  . nitroGLYCERIN (NITROSTAT) SL tablet 0.4 mg  0.4 mg Sublingual Q5 Min x 3 PRN Suzi Roots, MD   0.4 mg at 01/08/11 2303  . ondansetron (ZOFRAN) injection 4 mg  4 mg Intravenous Once Trudi Ida  Denton Lank, MD   4 mg at 01/08/11 1839  . pantoprazole (PROTONIX) injection 40 mg  40 mg Intravenous Once Suzi Roots, MD   40 mg at 01/08/11 1952   No current outpatient prescriptions on file as of 01/08/2011.    Results for orders placed during the hospital encounter of 01/08/11 (from the past 48 hour(s))  PROTIME-INR     Status: Normal   Collection Time   01/08/11  6:13 PM      Component Value Range Comment   Prothrombin Time 12.7  11.6 - 15.2 (seconds)    INR 0.93  0.00 - 1.49    APTT     Status: Normal   Collection Time   01/08/11  6:13 PM      Component Value Range Comment   aPTT 29  24 - 37 (seconds)   CBC     Status: Abnormal   Collection Time   01/08/11  6:13 PM      Component Value Range Comment   WBC 10.8 (*) 4.0 - 10.5 (K/uL)    RBC 4.55  4.22 - 5.81 (MIL/uL)    Hemoglobin 13.6  13.0 - 17.0 (g/dL)    HCT 16.1  09.6  - 04.5 (%)    MCV 90.5  78.0 - 100.0 (fL)    MCH 29.9  26.0 - 34.0 (pg)    MCHC 33.0  30.0 - 36.0 (g/dL)    RDW 40.9  81.1 - 91.4 (%)    Platelets 272  150 - 400 (K/uL)   DIFFERENTIAL     Status: Abnormal   Collection Time   01/08/11  6:13 PM      Component Value Range Comment   Neutrophils Relative 50  43 - 77 (%)    Neutro Abs 5.4  1.7 - 7.7 (K/uL)    Lymphocytes Relative 40  12 - 46 (%)    Lymphs Abs 4.4 (*) 0.7 - 4.0 (K/uL)    Monocytes Relative 8  3 - 12 (%)    Monocytes Absolute 0.8  0.1 - 1.0 (K/uL)    Eosinophils Relative 2  0 - 5 (%)    Eosinophils Absolute 0.2  0.0 - 0.7 (K/uL)    Basophils Relative 0  0 - 1 (%)    Basophils Absolute 0.0  0.0 - 0.1 (K/uL)   COMPREHENSIVE METABOLIC PANEL     Status: Abnormal   Collection Time   01/08/11  6:13 PM      Component Value Range Comment   Sodium 137  135 - 145 (mEq/L)    Potassium 3.9  3.5 - 5.1 (mEq/L)    Chloride 101  96 - 112 (mEq/L)    CO2 24  19 - 32 (mEq/L)    Glucose, Bld 110 (*) 70 - 99 (mg/dL)    BUN 26 (*) 6 - 23 (mg/dL)    Creatinine, Ser 7.82  0.50 - 1.35 (mg/dL)    Calcium 9.0  8.4 - 10.5 (mg/dL)    Total Protein 7.9  6.0 - 8.3 (g/dL)    Albumin 3.8  3.5 - 5.2 (g/dL)    AST 28  0 - 37 (U/L) HEMOLYSIS AT THIS LEVEL MAY AFFECT RESULT   ALT 29  0 - 53 (U/L)    Alkaline Phosphatase 53  39 - 117 (U/L)    Total Bilirubin 0.1 (*) 0.3 - 1.2 (mg/dL)    GFR calc non Af Amer >90  >90 (mL/min)    GFR calc Af Amer >90  >  90 (mL/min)   CK TOTAL AND CKMB     Status: Normal   Collection Time   01/08/11  6:13 PM      Component Value Range Comment   Total CK 143  7 - 232 (U/L)    CK, MB 2.8  0.3 - 4.0 (ng/mL)    Relative Index 2.0  0.0 - 2.5    TROPONIN I     Status: Normal   Collection Time   01/08/11  6:13 PM      Component Value Range Comment   Troponin I <0.30  <0.30 (ng/mL)   POCT I-STAT TROPONIN I     Status: Normal   Collection Time   01/08/11  6:19 PM      Component Value Range Comment   Troponin i, poc  0.01  0.00 - 0.08 (ng/mL)    Comment 3            POCT I-STAT, CHEM 8     Status: Abnormal   Collection Time   01/08/11  6:21 PM      Component Value Range Comment   Sodium 139  135 - 145 (mEq/L)    Potassium 3.8  3.5 - 5.1 (mEq/L)    Chloride 107  96 - 112 (mEq/L)    BUN 29 (*) 6 - 23 (mg/dL)    Creatinine, Ser 7.84  0.50 - 1.35 (mg/dL)    Glucose, Bld 696 (*) 70 - 99 (mg/dL)    Calcium, Ion 2.95 (*) 1.12 - 1.32 (mmol/L)    TCO2 17  0 - 100 (mmol/L)    Hemoglobin 14.3  13.0 - 17.0 (g/dL)    HCT 28.4  13.2 - 44.0 (%)   ETHANOL     Status: Normal   Collection Time   01/08/11  7:17 PM      Component Value Range Comment   Alcohol, Ethyl (B) <11  0 - 11 (mg/dL)   URINE RAPID DRUG SCREEN (HOSP PERFORMED)     Status: Abnormal   Collection Time   01/08/11  8:34 PM      Component Value Range Comment   Opiates POSITIVE (*) NONE DETECTED     Cocaine POSITIVE (*) NONE DETECTED     Benzodiazepines POSITIVE (*) NONE DETECTED     Amphetamines NONE DETECTED  NONE DETECTED     Tetrahydrocannabinol POSITIVE (*) NONE DETECTED     Barbiturates NONE DETECTED  NONE DETECTED    URINALYSIS, ROUTINE W REFLEX MICROSCOPIC     Status: Abnormal   Collection Time   01/08/11  8:35 PM      Component Value Range Comment   Color, Urine YELLOW  YELLOW     APPearance CLEAR  CLEAR     Specific Gravity, Urine 1.033 (*) 1.005 - 1.030     pH 5.0  5.0 - 8.0     Glucose, UA NEGATIVE  NEGATIVE (mg/dL)    Hgb urine dipstick NEGATIVE  NEGATIVE     Bilirubin Urine NEGATIVE  NEGATIVE     Ketones, ur NEGATIVE  NEGATIVE (mg/dL)    Protein, ur NEGATIVE  NEGATIVE (mg/dL)    Urobilinogen, UA 0.2  0.0 - 1.0 (mg/dL)    Nitrite NEGATIVE  NEGATIVE     Leukocytes, UA NEGATIVE  NEGATIVE  MICROSCOPIC NOT DONE ON URINES WITH NEGATIVE PROTEIN, BLOOD, LEUKOCYTES, NITRITE, OR GLUCOSE <1000 mg/dL.  CARDIAC PANEL(CRET KIN+CKTOT+MB+TROPI)     Status: Abnormal   Collection Time   01/08/11  9:31 PM  Component Value Range Comment    Total CK 147  7 - 232 (U/L)    CK, MB 6.5 (*) 0.3 - 4.0 (ng/mL)    Troponin I 0.65 (*) <0.30 (ng/mL)    Relative Index 4.4 (*) 0.0 - 2.5     Ct Head Wo Contrast  01/08/2011  *RADIOLOGY REPORT*  Clinical Data: Code stroke, left arm tingling  CT HEAD WITHOUT CONTRAST  Technique:  Contiguous axial images were obtained from the base of the skull through the vertex without contrast.  Comparison: [facility] CT head dated 06/29/2008  Findings: No evidence of parenchymal hemorrhage or extra-axial fluid collection. No mass lesion, mass effect, or midline shift.  No CT evidence of acute infarction.  Mild subcortical white matter and periventricular small vessel ischemic changes.  Cerebral volume is age appropriate.  No ventriculomegaly.  Mucosal thickening in the right maxillary sinus.  Visualized paranasal sinuses and mastoid air cells are otherwise clear.  No evidence of calvarial fracture.  IMPRESSION: No evidence of acute intracranial abnormality.  Mild small vessel ischemic changes.  Original Report Authenticated By: Charline Bills, M.D.   Ct Angio Chest W/cm &/or Wo Cm  01/08/2011  *RADIOLOGY REPORT*  Clinical Data:  Chest pain  CT ANGIOGRAPHY CHEST WITH CONTRAST  Technique:  Multidetector CT imaging of the chest was performed using the standard protocol during bolus administration of intravenous contrast.  Multiplanar CT image reconstructions including MIPs were obtained to evaluate the vascular anatomy.  Contrast: OMNIPAQUE IOHEXOL 350 MG/ML IV SOLN  Comparison:  11/15/2008 radiograph  Findings:  No pulmonary arterial branch filling defect.  Normal course and caliber aorta.  No dissection.  Heart size upper normal limits to mildly enlarged. Coronary artery calcification.  No pericardial effusion.  No pleural effusion.  No intrathoracic lymphadenopathy.  Limited images through the upper abdomen show no acute abnormality.  Central airways are patent.  Dependent bibasilar atelectasis.  No  pneumothorax.  No acute osseous abnormality.  Review of the MIP images confirms the above findings.  IMPRESSION: No pulmonary embolism, aortic dissection, or acute intrathoracic process.  Coronary artery calcification.  Original Report Authenticated By: Waneta Martins, M.D.    Review of Systems  Constitutional: Negative.   HENT: Negative.   Eyes: Negative.   Respiratory: Positive for shortness of breath.   Cardiovascular: Positive for chest pain.  Gastrointestinal: Negative.   Genitourinary: Negative.   Musculoskeletal: Negative.   Skin: Negative.   Neurological: Negative.   Endo/Heme/Allergies: Negative.   Psychiatric/Behavioral: Negative.     Blood pressure 139/105, pulse 93, temperature 98.7 F (37.1 C), temperature source Oral, resp. rate 18, height 5\' 9"  (1.753 m), weight 86.637 kg (191 lb), SpO2 95.00%. Physical Exam  Constitutional: He is oriented to person, place, and time. He appears well-developed and well-nourished. No distress.  HENT:  Head: Normocephalic and atraumatic.  Right Ear: External ear normal.  Left Ear: External ear normal.  Nose: Nose normal.  Mouth/Throat: Oropharynx is clear and moist. No oropharyngeal exudate.  Eyes: Conjunctivae and EOM are normal. Pupils are equal, round, and reactive to light. Right eye exhibits no discharge. Left eye exhibits no discharge. No scleral icterus.  Neck: Normal range of motion. Neck supple. No JVD present.  Cardiovascular: Normal rate, regular rhythm and normal heart sounds.   Respiratory: Effort normal and breath sounds normal. No respiratory distress. He has no wheezes. He has no rales.  GI: Soft. Bowel sounds are normal. He exhibits no distension. There is no tenderness. There is  no rebound.  Musculoskeletal: Normal range of motion. He exhibits no edema and no tenderness.  Neurological: He is alert and oriented to person, place, and time. He has normal reflexes. No cranial nerve deficit. Coordination normal.        Moves upper and lower limbs 5/5.  Skin: Skin is warm and dry. He is not diaphoretic. No erythema.  Psychiatric: His behavior is normal.     Assessment/Plan #1. Chest pain with positive troponin and drug screen positive for cocaine - cardiologist at this time wants hospitalist to admit. Since his troponins are positive he will be observed in step down. Patient has been started on IV heparin infusion which we will continue. We will cycle cardiac markers and a 2-D echo. Patient is positive for cocaine which could be the cause for his chest pain. We will try to avoid beta blockers. Patient will be on a nitroglycerin patch and as needed morphine IV and Ativan. #2. Positive for cocaine - patient denies taking cocaine though this is positive and drug screen. Along with tobacco abuse counseling patient will also need substance abuse counseling. He is also positive for benzodiazepine and opiates which patient states he takes for his low back pain and anxiety. He's also positive for marijuana. #3. History of chronic low back pain and anxiety - continue his home medications after verifying.  CODE STATUS - full code.  Vincen Bejar N. 01/09/2011, 12:07 AM

## 2011-01-09 NOTE — Progress Notes (Signed)
Pt smokes 1 ppd plus. He is in action stage and has never quit before. Recommended the Nicotrol inhaler to pt and referred to MD. Discussed inhaler use instructions and possible side effects to look for. Referred to 1-800 quit now for f/u and support. Discussed oral fixation substitutes, second hand smoke and in home smoking policy. Reviewed and gave pt Written education/contact information.

## 2011-01-10 DIAGNOSIS — F1410 Cocaine abuse, uncomplicated: Secondary | ICD-10-CM | POA: Diagnosis present

## 2011-01-10 DIAGNOSIS — I214 Non-ST elevation (NSTEMI) myocardial infarction: Secondary | ICD-10-CM | POA: Diagnosis present

## 2011-01-10 DIAGNOSIS — Z720 Tobacco use: Secondary | ICD-10-CM | POA: Diagnosis present

## 2011-01-10 DIAGNOSIS — F419 Anxiety disorder, unspecified: Secondary | ICD-10-CM | POA: Diagnosis present

## 2011-01-10 LAB — BASIC METABOLIC PANEL
BUN: 13 mg/dL (ref 6–23)
Chloride: 105 mEq/L (ref 96–112)
GFR calc non Af Amer: >90 mL/min (ref >90)
Glucose, Bld: 101 mg/dL — ABNORMAL HIGH (ref 70–99)
Potassium: 3.4 mEq/L — ABNORMAL LOW (ref 3.5–5.1)

## 2011-01-10 LAB — CBC
HCT: 35.6 % — ABNORMAL LOW (ref 39.0–52.0)
Hemoglobin: 11.3 g/dL — ABNORMAL LOW (ref 13.0–17.0)
MCHC: 31.7 g/dL (ref 30.0–36.0)

## 2011-01-10 LAB — HEPARIN LEVEL (UNFRACTIONATED): Heparin Unfractionated: 0.49 IU/mL (ref 0.30–0.70)

## 2011-01-10 MED ORDER — POTASSIUM CHLORIDE CRYS ER 20 MEQ PO TBCR
40.0000 meq | EXTENDED_RELEASE_TABLET | Freq: Once | ORAL | Status: AC
  Administered 2011-01-10: 40 meq via ORAL
  Filled 2011-01-10: qty 2

## 2011-01-10 MED ORDER — DIAZEPAM 5 MG PO TABS
5.0000 mg | ORAL_TABLET | Freq: Once | ORAL | Status: AC
  Administered 2011-01-10: 5 mg via ORAL
  Filled 2011-01-10: qty 1

## 2011-01-10 MED ORDER — HYDROCODONE-ACETAMINOPHEN 10-325 MG PO TABS
1.0000 | ORAL_TABLET | Freq: Two times a day (BID) | ORAL | Status: DC | PRN
  Administered 2011-01-11: 1 via ORAL
  Filled 2011-01-10: qty 1

## 2011-01-10 MED ORDER — CLOPIDOGREL BISULFATE 75 MG PO TABS
75.0000 mg | ORAL_TABLET | Freq: Every day | ORAL | Status: DC
  Administered 2011-01-11: 75 mg via ORAL
  Filled 2011-01-10 (×2): qty 1

## 2011-01-10 MED ORDER — DIAZEPAM 5 MG PO TABS
5.0000 mg | ORAL_TABLET | Freq: Two times a day (BID) | ORAL | Status: DC | PRN
  Administered 2011-01-10: 5 mg via ORAL
  Filled 2011-01-10: qty 1

## 2011-01-10 MED ORDER — HYDROCODONE-ACETAMINOPHEN 10-325 MG PO TABS
1.0000 | ORAL_TABLET | Freq: Once | ORAL | Status: AC
  Administered 2011-01-10: 1 via ORAL
  Filled 2011-01-10: qty 1

## 2011-01-10 MED ORDER — NICOTINE 21 MG/24HR TD PT24
21.0000 mg | MEDICATED_PATCH | Freq: Every day | TRANSDERMAL | Status: DC
  Administered 2011-01-10 – 2011-01-11 (×2): 21 mg via TRANSDERMAL
  Filled 2011-01-10 (×2): qty 1

## 2011-01-10 MED ORDER — CLOPIDOGREL BISULFATE 300 MG PO TABS
600.0000 mg | ORAL_TABLET | Freq: Once | ORAL | Status: AC
  Administered 2011-01-10: 600 mg via ORAL
  Filled 2011-01-10: qty 2

## 2011-01-10 NOTE — Progress Notes (Signed)
Subjective:  50 year old male with cocaine-induced acute myocardial infarction. Currently chest pain-free but he states that he is having chest pain off and on. Currently complaining of headache. He is on nitroglycerin patch.  Objective:  Vital Signs in the last 24 hours: Temp:  [97.8 F (36.6 C)-98.4 F (36.9 C)] 98 F (36.7 C) (12/20 0800) Pulse Rate:  [67-88] 77  (12/20 0800) Resp:  [11-22] 17  (12/20 0800) BP: (112-132)/(61-86) 123/78 mmHg (12/20 0800) SpO2:  [96 %-100 %] 96 % (12/20 0800)  Intake/Output from previous day: 12/19 0701 - 12/20 0700 In: 1246 [I.V.:1246] Out: 925 [Urine:925]   Physical Exam: General: Well developed, well nourished, in no acute distress. Head:  Normocephalic and atraumatic. Lungs: Clear to auscultation and percussion. Heart: Normal S1 and S2.  No murmur, rubs or gallops.  Pulses: Pulses normal in all 4 extremities. Extremities: No clubbing or cyanosis. No edema. Neurologic: Alert and oriented x 3.    Lab Results:  [facility] 01/10/11 0428 01/09/11 0455  WBC 10.1 10.8*  HGB 11.3* 12.4*  PLT 285 262    Basename 01/10/11 0428 01/09/11 0455  NA 140 141  K 3.4* 4.1  CL 105 106  CO2 27 29  GLUCOSE 101* 98  BUN 13 17  CREATININE 0.86 0.95    Basename 01/09/11 2141 01/09/11 1320  TROPONINI 4.45* 5.65*   Hepatic Function Panel  Basename 01/09/11 0455  PROT 6.9  ALBUMIN 3.3*  AST 35  ALT 29  ALKPHOS 46  BILITOT 0.3  BILIDIR --  IBILI --  Imaging: Ct Head Wo Contrast  01/08/2011  *RADIOLOGY REPORT*  Clinical Data: Code stroke, left arm tingling  CT HEAD WITHOUT CONTRAST  Technique:  Contiguous axial images were obtained from the base of the skull through the vertex without contrast.  Comparison: [facility] CT head dated 06/29/2008  Findings: No evidence of parenchymal hemorrhage or extra-axial fluid collection. No mass lesion, mass effect, or midline shift.  No CT evidence of acute infarction.  Mild subcortical white matter and  periventricular small vessel ischemic changes.  Cerebral volume is age appropriate.  No ventriculomegaly.  Mucosal thickening in the right maxillary sinus.  Visualized paranasal sinuses and mastoid air cells are otherwise clear.  No evidence of calvarial fracture.  IMPRESSION: No evidence of acute intracranial abnormality.  Mild small vessel ischemic changes.  Original Report Authenticated By: Charline Bills, M.D.   Ct Angio Chest W/cm &/or Wo Cm  01/08/2011  *RADIOLOGY REPORT*  Clinical Data:  Chest pain  CT ANGIOGRAPHY CHEST WITH CONTRAST  Technique:  Multidetector CT imaging of the chest was performed using the standard protocol during bolus administration of intravenous contrast.  Multiplanar CT image reconstructions including MIPs were obtained to evaluate the vascular anatomy.  Contrast: OMNIPAQUE IOHEXOL 350 MG/ML IV SOLN  Comparison:  11/15/2008 radiograph  Findings:  No pulmonary arterial branch filling defect.  Normal course and caliber aorta.  No dissection.  Heart size upper normal limits to mildly enlarged. Coronary artery calcification.  No pericardial effusion.  No pleural effusion.  No intrathoracic lymphadenopathy.  Limited images through the upper abdomen show no acute abnormality.  Central airways are patent.  Dependent bibasilar atelectasis.  No pneumothorax.  No acute osseous abnormality.  Review of the MIP images confirms the above findings.  IMPRESSION: No pulmonary embolism, aortic dissection, or acute intrathoracic process.  Coronary artery calcification.  Original Report Authenticated By: Waneta Martins, M.D.    EKG:  EKG this morning personally view shows sinus  rhythm with tolerable to age and T wave, V3 possibly hyperacute. When compared to prior, T-wave is slightly more prominent. Cardiac Studies:  Echocardiogram shows normal ejection fraction. Reassuring. Cardiac markers are trending downward.  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 This will give him approximally 48 hours of IV heparin in the setting of this myocardial infarction. Avoid beta blocker due to cocaine. Echocardiogram is reassuring. There are no obvious wall motion abnormalities. In one view, there may be mild distal inferior hypokinesis but overall normal. I will go ahead and load him with Plavix given his acute coronary syndrome. 600 mg x1 and then 75 mg thereafter. Currently not a catheterization candidate given his cocaine use, positive and his urine tox screen. Continue with aggressive medical management. EF reassuring.  Hypertension-continue to manage per primary team. Amlodipine reasonable choice if necessary.  Headache-impart secondary to withdrawal but also may be secondary to nitroglycerin. May consider discontinuation of nitroglycerin.  Cocaine abuse-counseling.    Haakon Titsworth 01/10/2011, 10:13 AM

## 2011-01-10 NOTE — Progress Notes (Signed)
ANTICOAGULATION CONSULT NOTE - Follow Up Consult  Pharmacy Consult for Heparin Indication: MI  Allergies  Allergen Reactions  . Penicillins     Rash     Patient Measurements: Height: 5\' 8"  (172.7 cm) Weight: 186 lb 4.6 oz (84.5 kg) IBW/kg (Calculated) : 68.4  Adjusted Body Weight:   Vital Signs: Temp: 98 F (36.7 C) (12/20 0800) Temp src: Oral (12/20 0800) BP: 123/78 mmHg (12/20 0800) Pulse Rate: 77  (12/20 0800)  Labs:  Basename 01/10/11 0428 01/09/11 2141 01/09/11 2134 01/09/11 1320 01/09/11 0455 01/08/11 1821 01/08/11 1813  HGB 11.3* -- -- -- 12.4* -- --  HCT 35.6* -- -- -- 38.8* 42.0 --  PLT 285 -- -- -- 262 -- 272  APTT -- -- -- -- -- -- 29  LABPROT -- -- -- -- -- -- 12.7  INR -- -- -- -- -- -- 0.93  HEPARINUNFRC 0.49 -- 0.27* 0.22* -- -- --  CREATININE 0.86 -- -- -- 0.95 1.30 --  CKTOTAL -- 279* -- 343* 349* -- --  CKMB -- 19.4* -- 30.9* 31.3* -- --  TROPONINI -- 4.45* -- 5.65* 5.42* -- --   Estimated Creatinine Clearance: 111.1 ml/min (by C-G formula based on Cr of 0.86).   Medications:  Heparin 1700 units/hr  Assessment: 50yom on heparin for cocaine-induced MI. Heparin level (0.49) is therapeutic. Per Cardiology, continue heparin until tomorrow AM.  - H/H decrease, Plts improved - No significant bleeding complications reported  Goal of Therapy:  Heparin level 0.3-0.7 units/ml   Plan:  1. Continue heparin 1700 units/hr (17 ml/hr) 2. Follow-up AM heparin level and discontinuation orders  Austin Simmons [phone] 01/10/2011,11:46 AM

## 2011-01-10 NOTE — Progress Notes (Signed)
HPI-HPI: 50 year-old male with history of chronic low back pain and anxiety had gone to the church and on his way when he reached home he had sudden severe chest pain. It happened around 4 PM and it lasted for 5 minutes. The chest pain is more on the left anterior chest wall has the same time he had feeling of numbness in his left upper extremity.A CT chest was negative for dissection but his troponin is mildly elevated and EKG not showing any specific. His drug screen is positive for cocaine.     Subjective: Crying and saying he misses his wife. Also saying that he has had a headache since he arrived in the hospital. He took of his Nitro paste last night. He states he takes Vicodin(10 mg) and Valium(5mg ) at home twice a day and is asking for them. He has been receiving Morphine here.   Objective: Blood pressure 140/90, pulse 82, temperature 98.6 F (37 C), temperature source Oral, resp. rate 15, height 5\' 8"  (1.727 m), weight 84.5 kg (186 lb 4.6 oz), SpO2 99.00%. Weight change:   Intake/Output Summary (Last 24 hours) at 01/10/11 1342 Last data filed at 01/10/11 1200  Gross per 24 hour  Intake   1030 ml  Output    975 ml  Net     55 ml    Physical Exam: General: Well developed, well nourished, in no acute distress.  Head: Normocephalic and atraumatic.  Lungs: Clear to auscultation and percussion.  Heart: Normal S1 and S2. No murmur, rubs or gallops.  Pulses: Pulses normal in all 4 extremities.  Extremities: No clubbing or cyanosis. No edema.  Neurologic: Alert and oriented x 3.   Lab Results:  [facility] 01/10/11 0428 01/09/11 0455  NA 140 141  K 3.4* 4.1  CL 105 106  CO2 27 29  GLUCOSE 101* 98  BUN 13 17  CREATININE 0.86 0.95  CALCIUM 8.3* 8.6  MG -- --  PHOS -- --    Basename 01/09/11 0455 01/08/11 1813  AST 35 28  ALT 29 29  ALKPHOS 46 53  BILITOT 0.3 0.1*  PROT 6.9 7.9  ALBUMIN 3.3* 3.8    Basename 01/09/11 0455  LIPASE 74*  AMYLASE --    Basename 01/10/11  0428 01/09/11 0455 01/08/11 1813  WBC 10.1 10.8* --  NEUTROABS -- -- 5.4  HGB 11.3* 12.4* --  HCT 35.6* 38.8* --  MCV 91.0 90.9 --  PLT 285 262 --    Basename 01/09/11 2141 01/09/11 1320 01/09/11 0455  CKTOTAL 279* 343* 349*  CKMB 19.4* 30.9* 31.3*  CKMBINDEX -- -- --  TROPONINI 4.45* 5.65* 5.42*   No components found with this basename: POCBNP:3 No results found for this basename: DDIMER:2 in the last 72 hours No results found for this basename: HGBA1C:2 in the last 72 hours No results found for this basename: CHOL:2,HDL:2,LDLCALC:2,TRIG:2,CHOLHDL:2,LDLDIRECT:2 in the last 72 hours  Basename 01/09/11 0455  TSH 25.372*  T4TOTAL --  T3FREE --  THYROIDAB --   No results found for this basename: VITAMINB12:2,FOLATE:2,FERRITIN:2,TIBC:2,IRON:2,RETICCTPCT:2 in the last 72 hours  Micro Results: Recent Results (from the past 240 hour(s))  MRSA PCR SCREENING     Status: Abnormal   Collection Time   01/09/11  3:17 AM      Component Value Range Status Comment   MRSA by PCR POSITIVE (*) NEGATIVE  Final     Studies/Results: Ct Head Wo Contrast  01/08/2011  *RADIOLOGY REPORT*  Clinical Data: Code stroke, left arm tingling  CT HEAD WITHOUT CONTRAST  Technique:  Contiguous axial images were obtained from the base of the skull through the vertex without contrast.  Comparison: [facility] CT head dated 06/29/2008  Findings: No evidence of parenchymal hemorrhage or extra-axial fluid collection. No mass lesion, mass effect, or midline shift.  No CT evidence of acute infarction.  Mild subcortical white matter and periventricular small vessel ischemic changes.  Cerebral volume is age appropriate.  No ventriculomegaly.  Mucosal thickening in the right maxillary sinus.  Visualized paranasal sinuses and mastoid air cells are otherwise clear.  No evidence of calvarial fracture.  IMPRESSION: No evidence of acute intracranial abnormality.  Mild small vessel ischemic changes.  Original Report  Authenticated By: Charline Bills, M.D.   Ct Angio Chest W/cm &/or Wo Cm  01/08/2011  *RADIOLOGY REPORT*  Clinical Data:  Chest pain  CT ANGIOGRAPHY CHEST WITH CONTRAST  Technique:  Multidetector CT imaging of the chest was performed using the standard protocol during bolus administration of intravenous contrast.  Multiplanar CT image reconstructions including MIPs were obtained to evaluate the vascular anatomy.  Contrast: OMNIPAQUE IOHEXOL 350 MG/ML IV SOLN  Comparison:  11/15/2008 radiograph  Findings:  No pulmonary arterial branch filling defect.  Normal course and caliber aorta.  No dissection.  Heart size upper normal limits to mildly enlarged. Coronary artery calcification.  No pericardial effusion.  No pleural effusion.  No intrathoracic lymphadenopathy.  Limited images through the upper abdomen show no acute abnormality.  Central airways are patent.  Dependent bibasilar atelectasis.  No pneumothorax.  No acute osseous abnormality.  Review of the MIP images confirms the above findings.  IMPRESSION: No pulmonary embolism, aortic dissection, or acute intrathoracic process.  Coronary artery calcification.  Original Report Authenticated By: Waneta Martins, M.D.    Medications: Scheduled Meds:   . aspirin  325 mg Oral Daily  . Chlorhexidine Gluconate Cloth  6 each Topical Q0600  . clopidogrel  600 mg Oral Once  . clopidogrel  75 mg Oral Q breakfast  . heparin  1,500 Units Intravenous Once  . influenza  inactive virus vaccine  0.5 mL Intramuscular Tomorrow-1000  . mupirocin ointment  1 application Nasal BID  . nitroGLYCERIN  0.4 mg Transdermal Daily  . rosuvastatin  20 mg Oral q1800   Continuous Infusions:   . sodium chloride 50 mL/hr at 01/10/11 0025  . heparin 17 mL/hr (01/10/11 0800)  . DISCONTD: heparin 13 mL/hr (01/09/11 1200)   PRN Meds:.acetaminophen, acetaminophen, LORazepam, morphine, nitroGLYCERIN, ondansetron (ZOFRAN) IV, ondansetron, DISCONTD: LORazepam, DISCONTD:  morphine  Assessment/Plan: Principal Problem:  *Chest pain- cocaine associated. Per Cardio, continue Heparin for 24 hrs. He is being loaded with Plavix. No cath planned.  CTA chest negative.  ECHO  Left ventricle: The cavity size was normal. Systolic function was normal. The estimated ejection fraction was in the range of 60% to 65%. Wall motion was normal; there were no regional wall motion abnormalities. Doppler parameters are consistent with abnormal left ventricular relaxation (grade 1 diastolic dysfunction).   Highly elevated TSH- 25.372- will repeat and order a free T4 as well.  Mild Hypokalemia- replace.  Cocaine abuse Nicotine Abuse  Low back pain Anxiety  I have requested a med rec be done by pharmacy. I can give him a one time dose of Valium and Vicodin now. Stable to transfer out of step down.   LOS: 2 days   [facility] [phone] 01/10/2011, 1:42 PM

## 2011-01-10 NOTE — Progress Notes (Signed)
New lab results received and text-paged to PA on-call.  TSH 13.548.  Free T4 normal at 1.08.  Continuing to monitor.

## 2011-01-10 NOTE — Progress Notes (Signed)
.  Clinical social worker completed patient psychosocial assessment, please see assessment in patient shadow chart. .No further Clinical Social Work needs, signing off.  Catha Gosselin, Theresia Majors  [phone]  .01/10/2011 14:23

## 2011-01-10 NOTE — Progress Notes (Signed)
Pt transferred per MD order to 3711, report called to receiving nurse and all pt belongings sent with patient to new room.

## 2011-01-11 ENCOUNTER — Other Ambulatory Visit: Payer: Self-pay

## 2011-01-11 LAB — HEPARIN LEVEL (UNFRACTIONATED): Heparin Unfractionated: 0.19 IU/mL — ABNORMAL LOW (ref 0.30–0.70)

## 2011-01-11 LAB — CBC
MCV: 91 fL (ref 78.0–100.0)
Platelets: 259 10*3/uL (ref 150–400)
RBC: 4.01 MIL/uL — ABNORMAL LOW (ref 4.22–5.81)
WBC: 8.3 10*3/uL (ref 4.0–10.5)

## 2011-01-11 MED ORDER — ROSUVASTATIN CALCIUM 20 MG PO TABS: 20.0000 mg | ORAL_TABLET | Freq: Every day | ORAL | Status: DC

## 2011-01-11 MED ORDER — ENOXAPARIN SODIUM 40 MG/0.4ML ~~LOC~~ SOLN
40.0000 mg | SUBCUTANEOUS | Status: DC
  Filled 2011-01-11: qty 0.4

## 2011-01-11 MED ORDER — CLOPIDOGREL BISULFATE 75 MG PO TABS: 75.0000 mg | ORAL_TABLET | Freq: Every day | ORAL | Status: DC

## 2011-01-11 MED ORDER — DIAZEPAM 5 MG PO TABS
5.0000 mg | ORAL_TABLET | Freq: Two times a day (BID) | ORAL | Status: AC | PRN
Start: 2011-01-11 — End: 2011-01-21

## 2011-01-11 NOTE — Discharge Summary (Signed)
Patient ID: Austin Simmons MRN: [id number] DOB/AGE: March 03, 1960 50 y.o.  Admit date: 01/08/2011 Discharge date: 01/11/2011  Primary Care Physician:  No primary provider on file.  Discharge Diagnoses:    Present on Admission:  .Chest pain .Low back pain .Cocaine abuse .Nicotine abuse .Anxiety .MI, acute, non ST segment elevation  Principal Problem:  *MI, acute, non ST segment elevation Active Problems:  Chest pain  Low back pain  Cocaine abuse  Nicotine abuse  Anxiety   Current Discharge Medication List    START taking these medications   Details  clopidogrel (PLAVIX) 75 MG tablet Take 1 tablet (75 mg total) by mouth daily with breakfast. Qty: 30 tablet, Refills: 3    diazepam (VALIUM) 5 MG tablet Take 1 tablet (5 mg total) by mouth every 12 (twelve) hours as needed for anxiety. Qty: 30 tablet, Refills: 0    rosuvastatin (CRESTOR) 20 MG tablet Take 1 tablet (20 mg total) by mouth daily at 6 PM. Qty: 30 tablet, Refills: 3      CONTINUE these medications which have NOT CHANGED   Details  aspirin 81 MG chewable tablet Chew 81 mg by mouth daily.     nitroGLYCERIN (NITROSTAT) 0.4 MG SL tablet Place 0.4 mg under the tongue every 5 (five) minutes as needed. Chest pain          Disposition and Follow-up: with PCP and cardiology  Consults:  cardiology  Significant Diagnostic Studies:  Ct Head Wo Contrast  01/08/2011  *RADIOLOGY REPORT*  Clinical Data: Code stroke, left arm tingling  CT HEAD WITHOUT CONTRAST  Technique:  Contiguous axial images were obtained from the base of the skull through the vertex without contrast.  Comparison: [facility] CT head dated 06/29/2008  Findings: No evidence of parenchymal hemorrhage or extra-axial fluid collection. No mass lesion, mass effect, or midline shift.  No CT evidence of acute infarction.  Mild subcortical white matter and periventricular small vessel ischemic changes.  Cerebral volume is age appropriate.  No  ventriculomegaly.  Mucosal thickening in the right maxillary sinus.  Visualized paranasal sinuses and mastoid air cells are otherwise clear.  No evidence of calvarial fracture.  IMPRESSION: No evidence of acute intracranial abnormality.  Mild small vessel ischemic changes.  Original Report Authenticated By: Charline Bills, M.D.   Ct Angio Chest W/cm &/or Wo Cm  01/08/2011  *RADIOLOGY REPORT*  Clinical Data:  Chest pain  CT ANGIOGRAPHY CHEST WITH CONTRAST  Technique:  Multidetector CT imaging of the chest was performed using the standard protocol during bolus administration of intravenous contrast.  Multiplanar CT image reconstructions including MIPs were obtained to evaluate the vascular anatomy.  Contrast: OMNIPAQUE IOHEXOL 350 MG/ML IV SOLN  Comparison:  11/15/2008 radiograph  Findings:  No pulmonary arterial branch filling defect.  Normal course and caliber aorta.  No dissection.  Heart size upper normal limits to mildly enlarged. Coronary artery calcification.  No pericardial effusion.  No pleural effusion.  No intrathoracic lymphadenopathy.  Limited images through the upper abdomen show no acute abnormality.  Central airways are patent.  Dependent bibasilar atelectasis.  No pneumothorax.  No acute osseous abnormality.  Review of the MIP images confirms the above findings.  IMPRESSION: No pulmonary embolism, aortic dissection, or acute intrathoracic process.  Coronary artery calcification.  Original Report Authenticated By: Waneta Martins, M.D.    Brief H and P: 50 year-old male with history of chronic low back pain and anxiety presented with sudden severe chest pain, lasted for 5 minutes. The  chest pain is  on the left anterior chest wall associated with feeling of numbness in his left upper extremity. His son brought him to the ER patient had cardiac enzymes and EKG and also CAT scan chest rule out PE or dissection. A CT chest was negative for dissection but his troponin was mildly elevated  and EKG non specific. His drug screen is positive for cocaine. Cardiologist on call Dr. Carolanne Grumbling was consulted by ER physician. Denies any nausea vomiting abdominal pain or diarrhea. Denies any focal deficits headache or visual symptoms. His left upper the numbness is completely resolved.   Physical Exam on Discharge:  Filed Vitals:   01/10/11 1200 01/10/11 1732 01/10/11 2200 01/11/11 0500  BP: 140/90 173/102 193/106 171/108  Pulse: 82 67 83 73  Temp: 98.6 F (37 C) 98.4 F (36.9 C) 97.8 F (36.6 C) 98.8 F (37.1 C)  TempSrc: Oral Oral    Resp: 15 18 20 20   Height:      Weight:    83.734 kg (184 lb 9.6 oz)  SpO2: 99% 97% 97% 98%     Intake/Output Summary (Last 24 hours) at 01/11/11 1121 Last data filed at 01/11/11 0800  Gross per 24 hour  Intake    968 ml  Output   2125 ml  Net  -1157 ml    General: Alert, awake, oriented x3, in no acute distress. HEENT: No bruits, no goiter. Heart: Regular rate and rhythm, without murmurs, rubs, gallops. Lungs: Clear to auscultation bilaterally. Abdomen: Soft, nontender, nondistended, positive bowel sounds. Extremities: No clubbing cyanosis or edema with positive pedal pulses. Neuro: Grossly intact, nonfocal.  CBC:    Component Value Date/Time   WBC 8.3 01/11/2011 0502   HGB 11.6* 01/11/2011 0502   HCT 36.5* 01/11/2011 0502   PLT 259 01/11/2011 0502   MCV 91.0 01/11/2011 0502   NEUTROABS 5.4 01/08/2011 1813   LYMPHSABS 4.4* 01/08/2011 1813   MONOABS 0.8 01/08/2011 1813   EOSABS 0.2 01/08/2011 1813   BASOSABS 0.0 01/08/2011 1813    Basic Metabolic Panel:    Component Value Date/Time   NA 140 01/10/2011 0428   K 3.4* 01/10/2011 0428   CL 105 01/10/2011 0428   CO2 27 01/10/2011 0428   BUN 13 01/10/2011 0428   CREATININE 0.86 01/10/2011 0428   GLUCOSE 101* 01/10/2011 0428   CALCIUM 8.3* 01/10/2011 0428    Hospital Course:  Principal Problem:  *MI, acute, non ST segment elevation - continue plavix ideally up to a  year; patient has been anticoagulated for 48 hours  Active Problems:  Chest pain - resolved at present   Cocaine abuse patient advise on drug abuse cessation   Nicotine abuse - smoking cessation counseling provided   Anxiety - continue valium PRN on discharge  Disposition - medically stable to be discharged home today  Education - patient is aware of plan of care and treatment   Time spent on Discharge: Greater than 30 minutes  Signed: Margarita Bobrowski 01/11/2011, 11:21 AM

## 2011-01-11 NOTE — Progress Notes (Signed)
Subjective:  50 year old male with cocaine-induced acute myocardial infarction. Currently CP free. Ambulating well. Still with migraine.  Objective:  Vital Signs in the last 24 hours: Temp:  [97.8 F (36.6 C)-98.8 F (37.1 C)] 98.8 F (37.1 C) (12/21 0500) Pulse Rate:  [67-83] 73  (12/21 0500) Resp:  [15-20] 20  (12/21 0500) BP: (140-193)/(90-108) 171/108 mmHg (12/21 0500) SpO2:  [97 %-99 %] 98 % (12/21 0500) Weight:  [83.734 kg (184 lb 9.6 oz)] 184 lb 9.6 oz (83.734 kg) (12/21 0500)  Intake/Output from previous day: 12/20 0701 - 12/21 0700 In: 1133 [P.O.:480; I.V.:553] Out: 1450 [Urine:1450]   Physical Exam: General: Well developed, well nourished, in no acute distress. Head:  Normocephalic and atraumatic. Lungs: Clear to auscultation and percussion. Heart: Normal S1 and S2.  No murmur, rubs or gallops.  Pulses: Pulses normal in all 4 extremities. Extremities: No clubbing or cyanosis. No edema. Neurologic: Alert and oriented x 3.    Lab Results:  Basename 01/11/11 0502 01/10/11 0428  WBC 8.3 10.1  HGB 11.6* 11.3*  PLT 259 285    Basename 01/10/11 0428 01/09/11 0455  NA 140 141  K 3.4* 4.1  CL 105 106  CO2 27 29  GLUCOSE 101* 98  BUN 13 17  CREATININE 0.86 0.95    Basename 01/09/11 2141 01/09/11 1320  TROPONINI 4.45* 5.65*   Hepatic Function Panel  Basename 01/09/11 0455  PROT 6.9  ALBUMIN 3.3*  AST 35  ALT 29  ALKPHOS 46  BILITOT 0.3  BILIDIR --  IBILI --    EKG:  SR, TW prominent but likely repol.   Cardiac Studies:  ECHO EF normal  Assessment/Plan:  Principal Problem:  *MI, acute, non ST segment elevation Active Problems:  Chest pain  Low back pain  Cocaine abuse  Nicotine abuse  Anxiety  Has had heparin for 48 hours. DC ASA 81 continue Plavix 75 QD continue for at least one month, optimal one year. Statin - can choose simvastatin for cost.  NTG SL. PRN Cocaine and tobacco cessation With BP elevated - would suggest ACE-I. Avoid Bb  with cocaine. No Bb secondary to cocaine.  OK from my standpoint for discharge.  Follow up with PCP.    Kirstine Jacquin 01/11/2011, 8:53 AM

## 2011-01-14 NOTE — Progress Notes (Signed)
   CARE MANAGEMENT NOTE 01/14/2011  Patient:  Austin Simmons, Austin Simmons   Account Number:  [id number]  Date Initiated:  01/14/2011  Documentation initiated by:  Onnie Boer  Subjective/Objective Assessment:   PT WAS ADMITTED WITH CP     Action/Plan:   PROGRESSION OF CARE AND DISCHARGE PLANNING   Anticipated DC Date:  01/11/2011   Anticipated DC Plan:  HOME/SELF CARE      DC Planning Services  CM consult      Choice offered to / List presented to:             Status of service:  Completed, signed off Medicare Important Message given?   (If response is "NO", the following Medicare IM given date fields will be blank) Date Medicare IM given:   Date Additional Medicare IM given:    Discharge Disposition:  HOME/SELF CARE  Per UR Regulation:  Reviewed for med. necessity/level of care/duration of stay  Comments:  01/14/2011 Onnie Boer, RN, BSN 1410 PT WAS DC'D TO HOME WITH SELF CARE

## 2011-04-07 ENCOUNTER — Emergency Department (HOSPITAL_COMMUNITY)
Admission: EM | Admit: 2011-04-07 | Discharge: 2011-04-07 | Disposition: A | Discharge status: Home / Self Care | Payer: Medicare Other | Attending: Emergency Medicine | Admitting: Emergency Medicine

## 2011-04-07 ENCOUNTER — Emergency Department (HOSPITAL_COMMUNITY): Payer: Medicare Other

## 2011-04-07 ENCOUNTER — Encounter (HOSPITAL_COMMUNITY): Payer: Self-pay | Admitting: *Deleted

## 2011-04-07 DIAGNOSIS — M25579 Pain in unspecified ankle and joints of unspecified foot: Secondary | ICD-10-CM | POA: Insufficient documentation

## 2011-04-07 DIAGNOSIS — M79609 Pain in unspecified limb: Secondary | ICD-10-CM | POA: Insufficient documentation

## 2011-04-07 DIAGNOSIS — F17200 Nicotine dependence, unspecified, uncomplicated: Secondary | ICD-10-CM | POA: Insufficient documentation

## 2011-04-07 DIAGNOSIS — Y92410 Unspecified street and highway as the place of occurrence of the external cause: Secondary | ICD-10-CM | POA: Insufficient documentation

## 2011-04-07 DIAGNOSIS — M25539 Pain in unspecified wrist: Secondary | ICD-10-CM | POA: Insufficient documentation

## 2011-04-07 DIAGNOSIS — R4789 Other speech disturbances: Secondary | ICD-10-CM | POA: Insufficient documentation

## 2011-04-07 DIAGNOSIS — M25519 Pain in unspecified shoulder: Secondary | ICD-10-CM | POA: Insufficient documentation

## 2011-04-07 MED ORDER — CYCLOBENZAPRINE HCL 10 MG PO TABS: 10.0000 mg | ORAL_TABLET | Freq: Two times a day (BID) | ORAL | Status: AC | PRN

## 2011-04-07 MED ORDER — IBUPROFEN 600 MG PO TABS: 600.0000 mg | ORAL_TABLET | Freq: Four times a day (QID) | ORAL | Status: AC | PRN

## 2011-04-07 MED ORDER — OXYCODONE-ACETAMINOPHEN 5-325 MG PO TABS
2.0000 | ORAL_TABLET | Freq: Once | ORAL | Status: AC
  Administered 2011-04-07: 2 via ORAL
  Filled 2011-04-07: qty 2
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
## 2011-04-07 NOTE — Progress Notes (Signed)
Orthopedic Tech Progress Note Patient Details:  Austin Simmons November 10, 1960 [id number]  Other Ortho Devices Type of Ortho Device: Other (comment);Ace wrap Ortho Device Location: right arm sling. ace wrap to left ankle Ortho Device Interventions: Application   Austin Simmons T 04/07/2011, 3:34 PM

## 2011-04-07 NOTE — ED Provider Notes (Signed)
Medical screening examination/treatment/procedure(s) were performed by non-physician practitioner and as supervising physician I was immediately available for consultation/collaboration.  Juliet Rude. Rubin Payor, MD 04/07/11 1501
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
## 2011-04-08 ENCOUNTER — Encounter (HOSPITAL_COMMUNITY): Payer: Self-pay | Admitting: Internal Medicine

## 2011-04-21 ENCOUNTER — Emergency Department (HOSPITAL_COMMUNITY): Payer: No Typology Code available for payment source

## 2011-04-21 ENCOUNTER — Encounter (HOSPITAL_COMMUNITY): Payer: Self-pay | Admitting: Nurse Practitioner

## 2011-04-21 ENCOUNTER — Emergency Department (HOSPITAL_COMMUNITY)
Admission: EM | Admit: 2011-04-21 | Discharge: 2011-04-21 | Disposition: A | Discharge status: Home / Self Care | Payer: No Typology Code available for payment source | Attending: Emergency Medicine | Admitting: Emergency Medicine

## 2011-04-21 DIAGNOSIS — F17200 Nicotine dependence, unspecified, uncomplicated: Secondary | ICD-10-CM | POA: Insufficient documentation

## 2011-04-21 DIAGNOSIS — S51011A Laceration without foreign body of right elbow, initial encounter: Secondary | ICD-10-CM

## 2011-04-21 DIAGNOSIS — R079 Chest pain, unspecified: Secondary | ICD-10-CM | POA: Insufficient documentation

## 2011-04-21 DIAGNOSIS — S239XXA Sprain of unspecified parts of thorax, initial encounter: Secondary | ICD-10-CM | POA: Insufficient documentation

## 2011-04-21 DIAGNOSIS — S50311A Abrasion of right elbow, initial encounter: Secondary | ICD-10-CM

## 2011-04-21 DIAGNOSIS — IMO0002 Reserved for concepts with insufficient information to code with codable children: Secondary | ICD-10-CM | POA: Insufficient documentation

## 2011-04-21 DIAGNOSIS — S39012A Strain of muscle, fascia and tendon of lower back, initial encounter: Secondary | ICD-10-CM

## 2011-04-21 DIAGNOSIS — S60512A Abrasion of left hand, initial encounter: Secondary | ICD-10-CM

## 2011-04-21 DIAGNOSIS — S139XXA Sprain of joints and ligaments of unspecified parts of neck, initial encounter: Secondary | ICD-10-CM | POA: Insufficient documentation

## 2011-04-21 DIAGNOSIS — S8001XA Contusion of right knee, initial encounter: Secondary | ICD-10-CM

## 2011-04-21 DIAGNOSIS — R109 Unspecified abdominal pain: Secondary | ICD-10-CM | POA: Insufficient documentation

## 2011-04-21 DIAGNOSIS — I10 Essential (primary) hypertension: Secondary | ICD-10-CM | POA: Insufficient documentation

## 2011-04-21 DIAGNOSIS — S8000XA Contusion of unspecified knee, initial encounter: Secondary | ICD-10-CM | POA: Insufficient documentation

## 2011-04-21 DIAGNOSIS — S161XXA Strain of muscle, fascia and tendon at neck level, initial encounter: Secondary | ICD-10-CM

## 2011-04-21 DIAGNOSIS — M542 Cervicalgia: Secondary | ICD-10-CM | POA: Insufficient documentation

## 2011-04-21 DIAGNOSIS — S335XXA Sprain of ligaments of lumbar spine, initial encounter: Secondary | ICD-10-CM | POA: Insufficient documentation

## 2011-04-21 DIAGNOSIS — S0990XA Unspecified injury of head, initial encounter: Secondary | ICD-10-CM | POA: Insufficient documentation

## 2011-04-21 DIAGNOSIS — S301XXA Contusion of abdominal wall, initial encounter: Secondary | ICD-10-CM | POA: Insufficient documentation

## 2011-04-21 DIAGNOSIS — S51009A Unspecified open wound of unspecified elbow, initial encounter: Secondary | ICD-10-CM | POA: Insufficient documentation

## 2011-04-21 DIAGNOSIS — S80211A Abrasion, right knee, initial encounter: Secondary | ICD-10-CM

## 2011-04-21 DIAGNOSIS — S20219A Contusion of unspecified front wall of thorax, initial encounter: Secondary | ICD-10-CM | POA: Insufficient documentation

## 2011-04-21 LAB — ETHANOL: Alcohol, Ethyl (B): <11 mg/dL (ref 0–11)

## 2011-04-21 LAB — POCT I-STAT, CHEM 8
BUN: 19 mg/dL (ref 6–23)
Calcium, Ion: 1.1 mmol/L — ABNORMAL LOW (ref 1.12–1.32)
Chloride: 107 mEq/L (ref 96–112)
HCT: 38 % — ABNORMAL LOW (ref 39.0–52.0)
Potassium: 3.8 mEq/L (ref 3.5–5.1)
Sodium: 140 mEq/L (ref 135–145)

## 2011-04-21 LAB — CBC
MCH: 28.7 pg (ref 26.0–34.0)
MCHC: 32.3 g/dL (ref 30.0–36.0)
MCV: 88.7 fL (ref 78.0–100.0)
Platelets: 349 10*3/uL (ref 150–400)

## 2011-04-21 LAB — COMPREHENSIVE METABOLIC PANEL
ALT: 31 U/L (ref 0–53)
AST: 22 U/L (ref 0–37)
Albumin: 3.6 g/dL (ref 3.5–5.2)
CO2: 23 mEq/L (ref 19–32)
Chloride: 105 mEq/L (ref 96–112)
GFR calc non Af Amer: >90 mL/min (ref >90)
Potassium: 4 mEq/L (ref 3.5–5.1)
Sodium: 141 mEq/L (ref 135–145)
Total Bilirubin: 0.2 mg/dL — ABNORMAL LOW (ref 0.3–1.2)

## 2011-04-21 LAB — LACTIC ACID, PLASMA: Lactic Acid, Venous: 1.5 mmol/L (ref 0.5–2.2)

## 2011-04-21 LAB — SAMPLE TO BLOOD BANK

## 2011-04-21 LAB — APTT: aPTT: 26 seconds (ref 24–37)

## 2011-04-21 MED ORDER — TETANUS-DIPHTHERIA TOXOIDS TD 5-2 LFU IM INJ
0.5000 mL | INJECTION | Freq: Once | INTRAMUSCULAR | Status: AC
  Administered 2011-04-21: 0.5 mL via INTRAMUSCULAR
  Filled 2011-04-21 (×2): qty 0.5

## 2011-04-21 MED ORDER — HYDROMORPHONE HCL PF 1 MG/ML IJ SOLN: 1.0000 mg | Freq: Once | INTRAMUSCULAR | Status: DC

## 2011-04-21 MED ORDER — OXYCODONE-ACETAMINOPHEN 5-325 MG PO TABS
ORAL_TABLET | ORAL | Status: AC
  Filled 2011-04-21: qty 2

## 2011-04-21 MED ORDER — LIDOCAINE-EPINEPHRINE (PF) 2 %-1:200000 IJ SOLN
10.0000 mL | Freq: Once | INTRAMUSCULAR | Status: AC
  Administered 2011-04-21: 10 mL
  Filled 2011-04-21: qty 10

## 2011-04-21 MED ORDER — ONDANSETRON HCL 4 MG/2ML IJ SOLN
4.0000 mg | Freq: Once | INTRAMUSCULAR | Status: AC
  Administered 2011-04-21: 4 mg via INTRAVENOUS
  Filled 2011-04-21: qty 2

## 2011-04-21 MED ORDER — HYDROMORPHONE HCL PF 1 MG/ML IJ SOLN
1.0000 mg | Freq: Once | INTRAMUSCULAR | Status: AC
  Administered 2011-04-21: 1 mg via INTRAVENOUS
  Filled 2011-04-21: qty 1

## 2011-04-21 MED ORDER — OXYCODONE-ACETAMINOPHEN 5-325 MG PO TABS: 2.0000 | ORAL_TABLET | ORAL | Status: DC | PRN

## 2011-04-21 MED ORDER — IOHEXOL 300 MG/ML  SOLN
100.0000 mL | Freq: Once | INTRAMUSCULAR | Status: AC | PRN
  Administered 2011-04-21: 100 mL via INTRAVENOUS

## 2011-04-21 MED ORDER — SODIUM CHLORIDE 0.9 % IV SOLN
INTRAVENOUS | Status: DC
  Administered 2011-04-21: 16:00:00 via INTRAVENOUS

## 2011-04-21 MED ORDER — HYDROMORPHONE HCL PF 2 MG/ML IJ SOLN
2.0000 mg | Freq: Once | INTRAMUSCULAR | Status: AC
  Administered 2011-04-21: 2 mg via INTRAVENOUS
  Filled 2011-04-21: qty 1

## 2011-04-21 MED ORDER — OXYCODONE-ACETAMINOPHEN 5-325 MG PO TABS
2.0000 | ORAL_TABLET | Freq: Once | ORAL | Status: AC
  Administered 2011-04-21: 2 via ORAL

## 2011-04-21 NOTE — ED Notes (Signed)
Patient transported to X-ray 

## 2011-04-21 NOTE — ED Notes (Signed)
Helped pt call wife

## 2011-04-21 NOTE — ED Notes (Signed)
Patient discharged by Reita Cliche, RN.  Patient instructed to take prescriptions as directed, to follow up with primary care physician/referral, and to return to the ED for new, worsening, or concerning symptoms.  Patient instructed to follow up with PCP about blood pressure, and to not drive while taking prescription pain medications.

## 2011-04-21 NOTE — ED Notes (Signed)
Received bedside report from [place], Charity fundraiser.  Patient currently getting dressed; no respiratory or acute distress noted.  Patient updated on plan of care; informed patient that he is up for discharge and that discharge paperwork will be brought to him.  Patient given a coke.  Patient has no other questions or concerns at this time; will continue to monitor.

## 2011-04-21 NOTE — ED Provider Notes (Signed)
Laceration repair to R elbow performed per request of Dr. Fredricka Bonine.  Wound were thoroughly irrigated.  Sharp debridement performed.    LACERATION REPAIR Performed by: Fayrene Helper Authorized byFayrene Helper Consent: Verbal consent obtained. Risks and benefits: risks, benefits and alternatives were discussed Consent given by: patient Patient identity confirmed: provided demographic data Prepped and Draped in normal sterile fashion Wound explored  Laceration Location: R elbow  Laceration Length: 3cm  Dirt and small rock debris were irrigated and removed.  No other fb seen or palpated  Anesthesia: local infiltration  Local anesthetic: lidocaine 2% w epinephrine  Anesthetic total: 6 ml  Irrigation method: syringe Amount of cleaning: standard Sharp debridement performed.    Skin closure: prolene 3.0  Number of sutures: 4  Technique: simple interrupted  Patient tolerance: Patient tolerated the procedure well with no immediate complications.  Fayrene Helper, PA-C 04/21/11 1931
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
## 2011-04-21 NOTE — ED Notes (Signed)
Patient given clothes to wear home

## 2011-04-21 NOTE — ED Notes (Signed)
Patient unable to urinate at this time. Urinal at bedside.  

## 2011-04-21 NOTE — Discharge Instructions (Signed)
Abrasions Abrasions are skin scrapes. Their treatment depends on how large and deep the abrasion is. Abrasions do not extend through all layers of the skin. A cut or lesion through all skin layers is called a laceration. HOME CARE INSTRUCTIONS   If you were given a dressing, change it at least once a day or as instructed by your caregiver. If the bandage sticks, soak it off with a solution of water or hydrogen peroxide.   Twice a day, wash the area with soap and water to remove all the cream/ointment. You may do this in a sink, under a tub faucet, or in a shower. Rinse off the soap and pat dry with a clean towel. Look for signs of infection (see below).   Reapply cream/ointment according to your caregiver's instruction. This will help prevent infection and keep the bandage from sticking. Telfa or gauze over the wound and under the dressing or wrap will also help keep the bandage from sticking.   If the bandage becomes wet, dirty, or develops a foul smell, change it as soon as possible.   Only take over-the-counter or prescription medicines for pain, discomfort, or fever as directed by your caregiver.  SEEK IMMEDIATE MEDICAL CARE IF:   Increasing pain in the wound.   Signs of infection develop: redness, swelling, surrounding area is tender to touch, or pus coming from the wound.   You have a fever.   Any foul smell coming from the wound or dressing.  Most skin wounds heal within ten days. Facial wounds heal faster. However, an infection may occur despite proper treatment. You should have the wound checked for signs of infection within 24 to 48 hours or sooner if problems arise. If you were not given a wound-check appointment, look closely at the wound yourself on the second day for early signs of infection listed above. MAKE SURE YOU:   Understand these instructions.   Will watch your condition.   Will get help right away if you are not doing well or get worse.  Document Released:  10/17/2004 Document Revised: 12/27/2010 Document Reviewed: 12/11/2010 [facility] Patient Information 2012 [place], [place].Back Pain, Adult Low back pain is very common. About 1 in 5 people have back pain.The cause of low back pain is rarely dangerous. The pain often gets better over time.About half of people with a sudden onset of back pain feel better in just 2 weeks. About 8 in 10 people feel better by 6 weeks.  CAUSES Some common causes of back pain include:  Strain of the muscles or ligaments supporting the spine.   Wear and tear (degeneration) of the spinal discs.   Arthritis.   Direct injury to the back.  DIAGNOSIS Most of the time, the direct cause of low back pain is not known.However, back pain can be treated effectively even when the exact cause of the pain is unknown.Answering your caregiver's questions about your overall health and symptoms is one of the most accurate ways to make sure the cause of your pain is not dangerous. If your caregiver needs more information, he or she may order lab work or imaging tests (X-rays or MRIs).However, even if imaging tests show changes in your back, this usually does not require surgery. HOME CARE INSTRUCTIONS For many people, back pain returns.Since low back pain is rarely dangerous, it is often a condition that people can learn to [facility] their own.   Remain active. It is stressful on the back to sit or stand in one place.  Do not sit, drive, or stand in one place for more than 30 minutes at a time. Take short walks on level surfaces as soon as pain allows.Try to increase the length of time you walk each day.   Do not stay in bed.Resting more than 1 or 2 days can delay your recovery.   Do not avoid exercise or work.Your body is made to move.It is not dangerous to be active, even though your back may hurt.Your back will likely heal faster if you return to being active before your pain is gone.   Pay attention to your body when you  bend and lift. Many people have less discomfortwhen lifting if they bend their knees, keep the load close to their bodies,and avoid twisting. Often, the most comfortable positions are those that put less stress on your recovering back.   Find a comfortable position to sleep. Use a firm mattress and lie on your side with your knees slightly bent. If you lie on your back, put a pillow under your knees.   Only take over-the-counter or prescription medicines as directed by your caregiver. Over-the-counter medicines to reduce pain and inflammation are often the most helpful.Your caregiver may prescribe muscle relaxant drugs.These medicines help dull your pain so you can more quickly return to your normal activities and healthy exercise.   Put ice on the injured area.   Put ice in a plastic bag.   Place a towel between your skin and the bag.   Leave the ice on for 15 to 20 minutes, 3 to 4 times a day for the first 2 to 3 days. After that, ice and heat may be alternated to reduce pain and spasms.   Ask your caregiver about trying back exercises and gentle massage. This may be of some benefit.   Avoid feeling anxious or stressed.Stress increases muscle tension and can worsen back pain.It is important to recognize when you are anxious or stressed and learn ways to manage it.Exercise is a great option.  SEEK MEDICAL CARE IF:  You have pain that is not relieved with rest or medicine.   You have pain that does not improve in 1 week.   You have new symptoms.   You are generally not feeling well.  SEEK IMMEDIATE MEDICAL CARE IF:   You have pain that radiates from your back into your legs.   You develop new bowel or bladder control problems.   You have unusual weakness or numbness in your arms or legs.   You develop nausea or vomiting.   You develop abdominal pain.   You feel faint.  Document Released: 01/07/2005 Document Revised: 12/27/2010 Document Reviewed: 05/28/2010 [facility]  Patient Information 2012 [place], [place].Blunt Chest Trauma Blunt chest trauma is an injury caused by a blow to the chest. These chest injuries can be very painful. Blunt chest trauma often results in bruised or broken (fractured) ribs. Most cases of bruised and fractured ribs from blunt chest traumas get better after 1 to 3 weeks of rest and pain medicine. Often, the soft tissue in the chest wall is also injured, causing pain and bruising. Internal organs, such as the heart and lungs, may also be injured. Blunt chest trauma can lead to serious medical problems. This injury requires immediate medical care. CAUSES   Motor vehicle collisions.   Falls.   Physical violence.   Sports injuries.  SYMPTOMS   Chest pain. The pain may be worse when you move or breathe deeply.   Shortness of  breath.   Lightheadedness.   Bruising.   Tenderness.   Swelling.  DIAGNOSIS  Your caregiver will do a physical exam. X-rays may be taken to look for fractures. However, minor rib fractures may not show up on X-rays until a few days after the injury. If a more serious injury is suspected, further imaging tests may be done. This may include ultrasounds, computed tomography (CT) scans, or magnetic resonance imaging (MRI). TREATMENT  Treatment depends on the severity of your injury. Your caregiver may prescribe pain medicines and deep breathing exercises. HOME CARE INSTRUCTIONS  Limit your activities until you can move around without much pain.   Do not do any strenuous work until your injury is healed.   Put ice on the injured area.   Put ice in a plastic bag.   Place a towel between your skin and the bag.   Leave the ice on for 15 to 20 minutes, 3 to 4 times a day.   You may wear a rib belt as directed by your caregiver to reduce pain.   Practice deep breathing as directed by your caregiver to keep your lungs clear.   Only take over-the-counter or prescription medicines for pain, fever, or  discomfort as directed by your caregiver.  SEEK IMMEDIATE MEDICAL CARE IF:   You have increasing pain or shortness of breath.   You cough up blood.   You have nausea, vomiting, or abdominal pain.   You have a fever.   You feel dizzy, weak, or you faint.  MAKE SURE YOU:  Understand these instructions.   Will watch your condition.   Will get help right away if you are not doing well or get worse.  Document Released: 02/15/2004 Document Revised: 12/27/2010 Document Reviewed: 10/24/2010 [facility] Patient Information 2012 [place], [place].Blunt Abdominal Trauma A blunt injury to the abdomen can cause pain. The pain is most likely from bruising and stretching of your muscles. This pain is often made worse with movement. Most often these injuries are not serious and get better within 1 week with rest and mild pain medicine. However, internal organs (liver, spleen, kidneys) can be injured with blunt trauma. If you do not get better or if you get worse, further examination may be needed. Continue with your regular daily activities, but avoid any strenuous activities until your pain is improved. If your stomach is upset, stick to a clear liquid diet and slowly advance to solid food.  SEEK IMMEDIATE MEDICAL CARE IF:   You develop increasing pain, nausea, or repeated vomiting.   You develop chest pain or breathing difficulty.   You develop blood in the urine, vomit, or stool.   You develop weakness, fainting, fever, or other serious complaints.  Document Released: 02/15/2004 Document Revised: 12/27/2010 Document Reviewed: 06/02/2008 [facility] Patient Information 2012 [place], [place].Blunt Trauma You have been evaluated for injuries. You have been examined and your caregiver has not found injuries serious enough to require hospitalization. It is common to have multiple bruises and sore muscles following an accident. These tend to feel worse for the first 24 hours. You will feel more stiffness and  soreness over the next several hours and worse when you wake up the first morning after your accident. After this point, you should begin to improve with each passing day. The amount of improvement depends on the amount of damage done in the accident. Following your accident, if some part of your body does not work as it should, or if the pain in any  area continues to increase, you should return to the Emergency Department for re-evaluation.  HOME CARE INSTRUCTIONS  Routine care for sore areas should include:  Ice to sore areas every 2 hours for 20 minutes while awake for the next 2 days.   Drink extra fluids (not alcohol).   Take a hot or warm shower or bath once or twice a day to increase blood flow to sore muscles. This will help you "limber up".   Activity as tolerated. Lifting may aggravate neck or back pain.   Only take over-the-counter or prescription medicines for pain, discomfort, or fever as directed by your caregiver. Do not use aspirin. This may increase bruising or increase bleeding if there are small areas where this is happening.  SEEK IMMEDIATE MEDICAL CARE IF:  Numbness, tingling, weakness, or problem with the use of your arms or legs.   A severe headache is not relieved with medications.   There is a change in bowel or bladder control.   Increasing pain in any areas of the body.   Short of breath or dizzy.   Nauseated, vomiting, or sweating.   Increasing belly (abdominal) discomfort.   Blood in urine, stool, or vomiting blood.   Pain in either shoulder in an area where a shoulder strap would be.   Feelings of lightheadedness or if you have a fainting episode.  Sometimes it is not possible to identify all injuries immediately after the trauma. It is important that you continue to monitor your condition after the emergency department visit. If you feel you are not improving, or improving more slowly than should be expected, call your physician. If you feel your  symptoms (problems) are worsening, return to the Emergency Department immediately. Document Released: 10/03/2000 Document Revised: 12/27/2010 Document Reviewed: 08/26/2007 [facility] Patient Information 2012 [place], [place].Cervical Sprain A cervical sprain is an injury in the neck in which the ligaments are stretched or torn. The ligaments are the tissues that hold the bones of the neck (vertebrae) in place.Cervical sprains can range from very mild to very severe. Most cervical sprains get better in 1 to 3 weeks, but it depends on the cause and extent of the injury. Severe cervical sprains can cause the neck vertebrae to be unstable. This can lead to damage of the spinal cord and can result in serious nervous system problems. Your caregiver will determine whether your cervical sprain is mild or severe. CAUSES  Severe cervical sprains may be caused by:  Contact sport injuries (football, rugby, wrestling, hockey, auto racing, gymnastics, diving, martial arts, boxing).   Motor vehicle collisions.   Whiplash injuries. This means the neck is forcefully whipped backward and forward.   Falls.  Mild cervical sprains may be caused by:   Awkward positions, such as cradling a telephone between your ear and shoulder.   Sitting in a chair that does not offer proper support.   Working at a poorly Marketing executive station.   Activities that require looking up or down for long periods of time.  SYMPTOMS   Pain, soreness, stiffness, or a burning sensation in the front, back, or sides of the neck. This discomfort may develop immediately after injury or it may develop slowly and not begin for 24 hours or more after an injury.   Pain or tenderness directly in the middle of the back of the neck.   Shoulder or upper back pain.   Limited ability to move the neck.   Headache.   Dizziness.   Weakness, numbness,  or tingling in the hands or arms.   Muscle spasms.   Difficulty swallowing or chewing.     Tenderness and swelling of the neck.  DIAGNOSIS  Most of the time, your caregiver can diagnose this problem by taking your history and doing a physical exam. Your caregiver will ask about any known problems, such as arthritis in the neck or a previous neck injury. X-rays may be taken to find out if there are any other problems, such as problems with the bones of the neck. However, an X-ray often does not reveal the full extent of a cervical sprain. Other tests such as a computed tomography (CT) scan or magnetic resonance imaging (MRI) may be needed. TREATMENT  Treatment depends on the severity of the cervical sprain. Mild sprains can be treated with rest, keeping the neck in place (immobilization), and pain medicines. Severe cervical sprains need immediate immobilization and an appointment with an orthopedist or neurosurgeon. Several treatment options are available to help with pain, muscle spasms, and other symptoms. Your caregiver may prescribe:  Medicines, such as pain relievers, numbing medicines, or muscle relaxants.   Physical therapy. This can include stretching exercises, strengthening exercises, and posture training. Exercises and improved posture can help stabilize the neck, strengthen muscles, and help stop symptoms from returning.   A neck collar to be worn for short periods of time. Often, these collars are worn for comfort. However, certain collars may be worn to protect the neck and prevent further worsening of a serious cervical sprain.  HOME CARE INSTRUCTIONS   Put ice on the injured area.   Put ice in a plastic bag.   Place a towel between your skin and the bag.   Leave the ice on for 15 to 20 minutes, 3 to 4 times a day.   Only take over-the-counter or prescription medicines for pain, discomfort, or fever as directed by your caregiver.   Keep all follow-up appointments as directed by your caregiver.   Keep all physical therapy appointments as directed by your  caregiver.   If a neck collar is prescribed, wear it as directed by your caregiver.   Do not drive while wearing a neck collar.   Make any needed adjustments to your work station to promote good posture.   Avoid positions and activities that make your symptoms worse.   Warm up and stretch before being active to help prevent problems.  SEEK MEDICAL CARE IF:   Your pain is not controlled with medicine.   You are unable to decrease your pain medicine over time as planned.   Your activity level is not improving as expected.  SEEK IMMEDIATE MEDICAL CARE IF:   You develop any bleeding, stomach upset, or signs of an allergic reaction to your medicine.   Your symptoms get worse.   You develop new, unexplained symptoms.   You have numbness, tingling, weakness, or paralysis in any part of your body.  MAKE SURE YOU:   Understand these instructions.   Will watch your condition.   Will get help right away if you are not doing well or get worse.  Document Released: 11/04/2006 Document Revised: 12/27/2010 Document Reviewed: 10/10/2010 [facility] Patient Information 2012 [place], [place].Chest Contusion You have been checked for injuries to your chest. Your caregiver has not found injuries serious enough to require hospitalization. It is common to have bruises and sore muscles after an injury. These tend to feel worse the first 24 hours. You may gradually develop more stiffness and soreness  over the next several hours to several days. This usually feels worse the first morning following your injury. After a few days, you will usually begin to improve. The amount of improvement depends on the amount of damage. Following the accident, if the pain in any area continues to increase or you develop new areas of pain, you should see your primary caregiver or return to the Emergency Department for re-evaluation. HOME CARE INSTRUCTIONS   Put ice on sore areas every 2 hours for 20 minutes while awake for  the next 2 days.   Drink extra fluids. Do not drink alcohol.   Activity as tolerated. Lifting may make pain worse.   Only take over-the-counter or prescription medicines for pain, discomfort, or fever as directed by your caregiver. Do not use aspirin. This may increase bruising or increase bleeding.  SEEK IMMEDIATE MEDICAL CARE IF:   There is a worsening of any of the problems that brought you in for care.   Shortness of breath, dizziness or fainting develop.   You have chest pain, difficulty breathing, or develop pain going down the left arm or up into jaw.   You feel sick to your stomach (nausea), vomiting or sweats.   You have increasing belly (abdominal) discomfort.   There is blood in your urine, stool, or if you vomit blood.   There is pain in either shoulder in an area where a shoulder strap would be.   You have feelings of lightheadedness, or if you should have a fainting episode.   You have numbness, tingling, weakness, or problems with the use of your arms or legs.   Severe headaches not relieved with medications develop.   You have a change in bowel or bladder control.   There is increasing pain in any areas of the body.  If you feel your symptoms are worsening, and you are not able to see your primary caregiver, return to the Emergency Department immediately. MAKE SURE YOU:   Understand these instructions.   Will watch your condition.   Will get help right away if you are not doing well or get worse.  Document Released: 10/02/2000 Document Revised: 12/27/2010 Document Reviewed: 08/26/2007 [facility] Patient Information 2012 [place], [place].Cryotherapy Cryotherapy means treatment with cold. Ice or gel packs can be used to reduce both pain and swelling. Ice is the most helpful within the first 24 to 48 hours after an injury or flareup from overusing a muscle or joint. Sprains, strains, spasms, burning pain, shooting pain, and aches can all be eased with ice. Ice can  also be used when recovering from surgery. Ice is effective, has very few side effects, and is safe for most people to use. PRECAUTIONS  Ice is not a safe treatment option for people with:  Raynaud's phenomenon. This is a condition affecting small blood vessels in the extremities. Exposure to cold may cause your problems to return.   Cold hypersensitivity. There are many forms of cold hypersensitivity, including:   Cold urticaria. Red, itchy hives appear on the skin when the tissues begin to warm after being iced.   Cold erythema. This is a red, itchy rash caused by exposure to cold.   Cold hemoglobinuria. Red blood cells break down when the tissues begin to warm after being iced. The hemoglobin that carry oxygen are passed into the urine because they cannot combine with blood proteins fast enough.   Numbness or altered sensitivity in the area being iced.  If you have any of the following  conditions, do not use ice until you have discussed cryotherapy with your caregiver:  Heart conditions, such as arrhythmia, angina, or chronic heart disease.   High blood pressure.   Healing wounds or open skin in the area being iced.   Current infections.   Rheumatoid arthritis.   Poor circulation.   Diabetes.  Ice slows the blood flow in the region it is applied. This is beneficial when trying to stop inflamed tissues from spreading irritating chemicals to surrounding tissues. However, if you expose your skin to cold temperatures for too long or without the proper protection, you can damage your skin or nerves. Watch for signs of skin damage due to cold. HOME CARE INSTRUCTIONS Follow these tips to use ice and cold packs safely.  Place a dry or damp towel between the ice and skin. A damp towel will cool the skin more quickly, so you may need to shorten the time that the ice is used.   For a more rapid response, add gentle compression to the ice.   Ice for no more than 10 to 20 minutes at a  time. The bonier the area you are icing, the less time it will take to get the benefits of ice.   Check your skin after 5 minutes to make sure there are no signs of a poor response to cold or skin damage.   Rest 20 minutes or more in between uses.   Once your skin is numb, you can end your treatment. You can test numbness by very lightly touching your skin. The touch should be so light that you do not see the skin dimple from the pressure of your fingertip. When using ice, most people will feel these normal sensations in this order: cold, burning, aching, and numbness.   Do not use ice on someone who cannot communicate their responses to pain, such as small children or people with dementia.  HOW TO MAKE AN ICE PACK Ice packs are the most common way to use ice therapy. Other methods include ice massage, ice baths, and cryo-sprays. Muscle creams that cause a cold, tingly feeling do not offer the same benefits that ice offers and should not be used as a substitute unless recommended by your caregiver. To make an ice pack, do one of the following:  Place crushed ice or a bag of frozen vegetables in a sealable plastic bag. Squeeze out the excess air. Place this bag inside another plastic bag. Slide the bag into a pillowcase or place a damp towel between your skin and the bag.   Mix 3 parts water with 1 part rubbing alcohol. Freeze the mixture in a sealable plastic bag. When you remove the mixture from the freezer, it will be slushy. Squeeze out the excess air. Place this bag inside another plastic bag. Slide the bag into a pillowcase or place a damp towel between your skin and the bag.  SEEK MEDICAL CARE IF:  You develop white spots on your skin. This may give the skin a blotchy (mottled) appearance.   Your skin turns blue or pale.   Your skin becomes waxy or hard.   Your swelling gets worse.  MAKE SURE YOU:   Understand these instructions.   Will watch your condition.   Will get help right  away if you are not doing well or get worse.  Document Released: 09/03/2010 Document Revised: 12/27/2010 Document Reviewed: 09/03/2010 [facility] Patient Information 2012 [place], [place].Head Injury, Adult You have had a head injury that  does not appear serious at this time. A concussion is a state of changed mental ability, usually from a blow to the head. You should take clear liquids for the rest of the day and then resume your regular diet. You should not take sedatives or alcoholic beverages for as long as directed by your caregiver after discharge. After injuries such as yours, most problems occur within the first 24 hours. SYMPTOMS These minor symptoms may be experienced after discharge:  Memory difficulties.   Dizziness.   Headaches.   Double vision.   Hearing difficulties.   Depression.   Tiredness.   Weakness.   Difficulty with concentration.  If you experience any of these problems, you should not be alarmed. A concussion requires a few days for recovery. Many patients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

## 2011-04-25 ENCOUNTER — Emergency Department (HOSPITAL_COMMUNITY): Payer: PRIVATE HEALTH INSURANCE

## 2011-04-25 ENCOUNTER — Encounter (HOSPITAL_COMMUNITY): Payer: Self-pay

## 2011-04-25 ENCOUNTER — Emergency Department (HOSPITAL_COMMUNITY)
Admission: EM | Admit: 2011-04-25 | Discharge: 2011-04-25 | Disposition: A | Discharge status: Home / Self Care | Payer: PRIVATE HEALTH INSURANCE | Attending: Emergency Medicine | Admitting: Emergency Medicine

## 2011-04-25 DIAGNOSIS — IMO0002 Reserved for concepts with insufficient information to code with codable children: Secondary | ICD-10-CM | POA: Insufficient documentation

## 2011-04-25 DIAGNOSIS — I10 Essential (primary) hypertension: Secondary | ICD-10-CM | POA: Insufficient documentation

## 2011-04-25 DIAGNOSIS — R51 Headache: Secondary | ICD-10-CM | POA: Insufficient documentation

## 2011-04-25 DIAGNOSIS — M79609 Pain in unspecified limb: Secondary | ICD-10-CM | POA: Insufficient documentation

## 2011-04-25 DIAGNOSIS — M25519 Pain in unspecified shoulder: Secondary | ICD-10-CM | POA: Insufficient documentation

## 2011-04-25 DIAGNOSIS — M549 Dorsalgia, unspecified: Secondary | ICD-10-CM | POA: Insufficient documentation

## 2011-04-25 DIAGNOSIS — T07XXXA Unspecified multiple injuries, initial encounter: Secondary | ICD-10-CM | POA: Insufficient documentation

## 2011-04-25 MED ORDER — HYDROMORPHONE HCL PF 2 MG/ML IJ SOLN
2.0000 mg | Freq: Once | INTRAMUSCULAR | Status: AC
  Administered 2011-04-25: 2 mg via INTRAMUSCULAR
  Filled 2011-04-25: qty 1
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
## 2011-04-25 NOTE — ED Notes (Signed)
The pts abrasion to the rt forearm and the lt hand appear clean and no redness  Or odor

## 2011-04-25 NOTE — Discharge Instructions (Signed)
Abrasions Abrasions are skin scrapes. Their treatment depends on how large and deep the abrasion is. Abrasions do not extend through all layers of the skin. A cut or lesion through all skin layers is called a laceration. HOME CARE INSTRUCTIONS   If you were given a dressing, change it at least once a day or as instructed by your caregiver. If the bandage sticks, soak it off with a solution of water or hydrogen peroxide.   Twice a day, wash the area with soap and water to remove all the cream/ointment. You may do this in a sink, under a tub faucet, or in a shower. Rinse off the soap and pat dry with a clean towel. Look for signs of infection (see below).   Reapply cream/ointment according to your caregiver's instruction. This will help prevent infection and keep the bandage from sticking. Telfa or gauze over the wound and under the dressing or wrap will also help keep the bandage from sticking.   If the bandage becomes wet, dirty, or develops a foul smell, change it as soon as possible.   Only take over-the-counter or prescription medicines for pain, discomfort, or fever as directed by your caregiver.  SEEK IMMEDIATE MEDICAL CARE IF:   Increasing pain in the wound.   Signs of infection develop: redness, swelling, surrounding area is tender to touch, or pus coming from the wound.   You have a fever.   Any foul smell coming from the wound or dressing.  Most skin wounds heal within ten days. Facial wounds heal faster. However, an infection may occur despite proper treatment. You should have the wound checked for signs of infection within 24 to 48 hours or sooner if problems arise. If you were not given a wound-check appointment, look closely at the wound yourself on the second day for early signs of infection listed above. MAKE SURE YOU:   Understand these instructions.   Will watch your condition.   Will get help right away if you are not doing well or get worse.  Document Released:  10/17/2004 Document Revised: 12/27/2010 Document Reviewed: 12/11/2010 [facility] Patient Information 2012 [place], [place].Contusion A contusion is a deep bruise. Contusions are the result of an injury that caused bleeding under the skin. The contusion may turn blue, purple, or yellow. Minor injuries will give you a painless contusion, but more severe contusions may stay painful and swollen for a few weeks.  CAUSES  A contusion is usually caused by a blow, trauma, or direct force to an area of the body. SYMPTOMS   Swelling and redness of the injured area.   Bruising of the injured area.   Tenderness and soreness of the injured area.   Pain.  DIAGNOSIS  The diagnosis can be made by taking a history and physical exam. An X-ray, CT scan, or MRI may be needed to determine if there were any associated injuries, such as fractures. TREATMENT  Specific treatment will depend on what area of the body was injured. In general, the best treatment for a contusion is resting, icing, elevating, and applying cold compresses to the injured area. Over-the-counter medicines may also be recommended for pain control. Ask your caregiver what the best treatment is for your contusion. HOME CARE INSTRUCTIONS   Put ice on the injured area.   Put ice in a plastic bag.   Place a towel between your skin and the bag.   Leave the ice on for 15 to 20 minutes, 3 to 4 times a day.  Only take over-the-counter or prescription medicines for pain, discomfort, or fever as directed by your caregiver. Your caregiver may recommend avoiding anti-inflammatory medicines (aspirin, ibuprofen, and naproxen) for 48 hours because these medicines may increase bruising.   Rest the injured area.   If possible, elevate the injured area to reduce swelling.  SEEK IMMEDIATE MEDICAL CARE IF:   You have increased bruising or swelling.   You have pain that is getting worse.   Your swelling or pain is not relieved with medicines.  MAKE  SURE YOU:   Understand these instructions.   Will watch your condition.   Will get help right away if you are not doing well or get worse.  Document Released: 10/17/2004 Document Revised: 12/27/2010 Document Reviewed: 11/12/2010 [facility] Patient Information 2012 [place], [place].

## 2011-04-28 ENCOUNTER — Emergency Department (HOSPITAL_COMMUNITY)
Admission: EM | Admit: 2011-04-28 | Discharge: 2011-04-28 | Disposition: A | Discharge status: Home / Self Care | Payer: No Typology Code available for payment source | Attending: Emergency Medicine | Admitting: Emergency Medicine

## 2011-04-28 ENCOUNTER — Encounter (HOSPITAL_COMMUNITY): Payer: Self-pay | Admitting: Emergency Medicine

## 2011-04-28 DIAGNOSIS — I10 Essential (primary) hypertension: Secondary | ICD-10-CM | POA: Insufficient documentation

## 2011-04-28 DIAGNOSIS — Z8679 Personal history of other diseases of the circulatory system: Secondary | ICD-10-CM | POA: Insufficient documentation

## 2011-04-28 DIAGNOSIS — R52 Pain, unspecified: Secondary | ICD-10-CM | POA: Insufficient documentation

## 2011-04-28 DIAGNOSIS — M791 Myalgia, unspecified site: Secondary | ICD-10-CM

## 2011-04-28 DIAGNOSIS — I252 Old myocardial infarction: Secondary | ICD-10-CM | POA: Insufficient documentation

## 2011-04-28 MED ORDER — HYDROCODONE-ACETAMINOPHEN 5-325 MG PO TABS: 1.0000 | ORAL_TABLET | ORAL | Status: AC | PRN

## 2011-04-28 MED ORDER — HYDROCODONE-ACETAMINOPHEN 5-325 MG PO TABS
1.0000 | ORAL_TABLET | Freq: Once | ORAL | Status: AC
  Administered 2011-04-28: 1 via ORAL
  Filled 2011-04-28: qty 1

## 2011-04-28 MED ORDER — IBUPROFEN 800 MG PO TABS: 800.0000 mg | ORAL_TABLET | Freq: Three times a day (TID) | ORAL | Status: AC

## 2011-04-28 NOTE — ED Provider Notes (Signed)
Evaluation and management procedures were performed by the PA/NP/Resident Physician under my supervision/collaboration.   Anddy Wingert D Whittany Parish, MD 04/28/11 1602 

## 2011-04-28 NOTE — Discharge Instructions (Signed)
YOU CAN FOLLOW UP WITH YOUR DOCTOR (OR WITH THE DOCTOR YOU HAVE SEEN SINCE YOUR REGULAR DOCTOR LEFT OFFICE) FOR FURTHER PAIN MANAGEMENT IF NEEDED.
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
## 2011-04-28 NOTE — ED Provider Notes (Signed)
Medical screening examination/treatment/procedure(s) were performed by non-physician practitioner and as supervising physician I was immediately available for consultation/collaboration.   Austin Simmons A Kayzlee Wirtanen, MD 04/28/11 2013 

## 2011-04-29 LAB — CDS SEROLOGY

## 2012-03-17 ENCOUNTER — Encounter (HOSPITAL_COMMUNITY): Payer: Self-pay | Admitting: Nurse Practitioner

## 2012-03-17 ENCOUNTER — Emergency Department (HOSPITAL_COMMUNITY)
Admission: EM | Admit: 2012-03-17 | Discharge: 2012-03-17 | Disposition: A | Discharge status: Home / Self Care | Payer: PRIVATE HEALTH INSURANCE | Attending: Emergency Medicine | Admitting: Emergency Medicine

## 2012-03-17 ENCOUNTER — Emergency Department (HOSPITAL_COMMUNITY): Payer: PRIVATE HEALTH INSURANCE

## 2012-03-17 DIAGNOSIS — Z79899 Other long term (current) drug therapy: Secondary | ICD-10-CM | POA: Insufficient documentation

## 2012-03-17 DIAGNOSIS — I252 Old myocardial infarction: Secondary | ICD-10-CM | POA: Insufficient documentation

## 2012-03-17 DIAGNOSIS — Z8673 Personal history of transient ischemic attack (TIA), and cerebral infarction without residual deficits: Secondary | ICD-10-CM | POA: Insufficient documentation

## 2012-03-17 DIAGNOSIS — F17200 Nicotine dependence, unspecified, uncomplicated: Secondary | ICD-10-CM | POA: Insufficient documentation

## 2012-03-17 DIAGNOSIS — Z8739 Personal history of other diseases of the musculoskeletal system and connective tissue: Secondary | ICD-10-CM | POA: Insufficient documentation

## 2012-03-17 DIAGNOSIS — I10 Essential (primary) hypertension: Secondary | ICD-10-CM | POA: Insufficient documentation

## 2012-03-17 DIAGNOSIS — F411 Generalized anxiety disorder: Secondary | ICD-10-CM | POA: Insufficient documentation

## 2012-03-17 DIAGNOSIS — R51 Headache: Secondary | ICD-10-CM | POA: Insufficient documentation

## 2012-03-17 DIAGNOSIS — R209 Unspecified disturbances of skin sensation: Secondary | ICD-10-CM | POA: Insufficient documentation

## 2012-03-17 MED ORDER — ACETAMINOPHEN 325 MG PO TABS
650.0000 mg | ORAL_TABLET | Freq: Once | ORAL | Status: AC
  Administered 2012-03-17: 650 mg via ORAL
  Filled 2012-03-17: qty 2

## 2012-03-17 NOTE — ED Notes (Signed)
Pt given discharge paperwork; no additional questions by pt about discharge; verbalized understanding of f/u; e-signature obtained;

## 2012-03-17 NOTE — ED Provider Notes (Signed)
History    This chart was scribed for non-physician practitioner working with Austin Chick, MD by Austin Simmons, ED Scribe. This patient was seen in room TR07C/TR07C and the patient's care was started at 7:01 PM.    CSN: [id number]  Arrival date & time 03/17/12  1842   First MD Initiated Contact with Patient 03/17/12 1848      Chief Complaint  Patient presents with  . Migraine     The history is provided by the patient. No language interpreter was used.  Austin Simmons is a 52 y.o. male brought in by ambulance to the Emergency Department complaining of 3 weeks of constant, sharp head pain localized to top of head "like needles going into my skull."  Pain worsened by touch and with cough and has not been improved with ice and an unspecified OCM every 6 hours for the past 3 weeks.  Pt reports that his left eye occasionally goes numb and that his left arm went numb last week for several hours but is not currently numb.  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  Pt had TIA 03/2011. Per triage, EMS reports that pt's son told them that the pt ran out of hydrocodone 3 weeks ago and was told by PCP that he could not refill prescription until alter this week.  Per son's report, pt has been to multiple facilities seeking pain medications this week.   PCP is Dr. Lerry Liner.  Past Medical History  Diagnosis Date  . Low back pain   . Anxiety   . Hypertension   . Stroke   . Myocardial infarction     Past Surgical History  Procedure Laterality Date  . Rib fracture surgery    . Rotator cuff repair      History reviewed. No pertinent family history.  History  Substance Use Topics  . Smoking status: Current Every Day Smoker -- 1.00 packs/day for 13 years    Types: Cigarettes  . Smokeless tobacco: Not on file  . Alcohol Use: No      Review of Systems  HENT:       Head pain  Neurological: Positive for numbness.  All other systems  reviewed and are negative.    Allergies  Penicillins  Home Medications   Current Outpatient Rx  Name  Route  Sig  Dispense  Refill  . aspirin 81 MG chewable tablet   Oral   Chew 81 mg by mouth daily.          . diazepam (VALIUM) 5 MG tablet   Oral   Take 5 mg by mouth every 12 (twelve) hours as needed. For anxiety         . isosorbide mononitrate (IMDUR) 30 MG 24 hr tablet   Oral   Take 30 mg by mouth daily.         Marland Kitchen lisinopril (PRINIVIL,ZESTRIL) 10 MG tablet   Oral   Take 10 mg by mouth daily.         Marland Kitchen METOPROLOL TARTRATE PO   Oral   Take 1 tablet by mouth 2 (two) times daily.         . nitroGLYCERIN (NITROSTAT) 0.4 MG SL tablet   Sublingual   Place 0.4 mg under the tongue every 5 (five) minutes as needed. Chest pain          . rosuvastatin (CRESTOR) 10 MG tablet   Oral   Take 10  mg by mouth daily.         . simvastatin (ZOCOR) 40 MG tablet   Oral   Take 40 mg by mouth every evening.           BP 167/107  Pulse 111  Temp(Src) 98.3 F (36.8 C) (Oral)  Resp 18  SpO2 96%  Physical Exam  Nursing note and vitals reviewed. Constitutional: He is oriented to person, place, and time. He appears well-developed and well-nourished. No distress.  Pt appears to be in no acute distress.  Unkept in appearance.  HENT:  Head: Normocephalic and atraumatic.  Diffuse tenderness to scalp without focal point tenderness or overlying skin changes.  Partial denture.  Eyes: EOM are normal. Pupils are equal, round, and reactive to light.  No visual field cut.  Neck: Neck supple. No tracheal deviation present.  Cardiovascular: Normal rate.   Pulmonary/Chest: Effort normal. No respiratory distress.  Musculoskeletal: Normal range of motion.  Neurological: He is alert and oriented to person, place, and time.  GCS of 15, answers all appropriate questions, negative Romberg, normal gait, normal finger-to-nose coordination  Skin: Skin is warm and dry.  Psychiatric:  He has a normal mood and affect. His behavior is normal.    ED Course  Procedures (including critical care time) DIAGNOSTIC STUDIES: Oxygen Saturation is 96% on room air, adequate by my interpretation.    COORDINATION OF CARE: 7:11 PM- Patient informed of clinical course, understands medical decision-making process, and agrees with plan.   Ct Head Wo Contrast  03/17/2012  *RADIOLOGY REPORT*  Clinical Data: Headache.  CT HEAD WITHOUT CONTRAST  Technique:  Contiguous axial images were obtained from the base of the skull through the vertex without contrast.  Comparison: 04/21/2011  Findings: The brain demonstrates no evidence of hemorrhage, infarction, edema, mass effect, extra-axial fluid collection, hydrocephalus or mass lesion.  The skull is unremarkable.  IMPRESSION: Normal head CT.   Original Report Authenticated By: Irish Lack, M.D.      No diagnosis found.  8:13 PM Since headache is chronic in nature and head CT unremarkable.  I believe pt will benefit from further management by PCP.  He has no obvious gross focal deficit on today's exam.  I do not suspect stroke, TIA, meningitis, SAH or other life threatening condition at this time.  Pt continually request narcotic pain medication for his headache as i think is inappropriate.  Discuss risk/benefit of narcotic use for headache and will not prescribe any today.  Return precaution discussed.  Pt stable for discharge.    BP 167/107  Pulse 111  Temp(Src) 98.3 F (36.8 C) (Oral)  Resp 18  SpO2 96%  I have reviewed nursing notes and vital signs. I personally reviewed the imaging tests through PACS system  I reviewed available ER/hospitalization records thought the EMR   MDM  1. Headache   I personally performed the services described in this documentation, which was scribed in my presence. The recorded information has been reviewed and is accurate.        Fayrene Helper, PA-C 03/17/12 2015

## 2012-03-17 NOTE — ED Notes (Signed)
Pt reporting left eye and left arm numbness;

## 2012-03-17 NOTE — ED Notes (Signed)
Pt back from CT

## 2012-03-17 NOTE — ED Provider Notes (Signed)
Medical screening examination/treatment/procedure(s) were performed by non-physician practitioner and as supervising physician I was immediately available for consultation/collaboration.  Cuong Moorman K Linker, MD 03/17/12 2036 

## 2012-03-17 NOTE — ED Notes (Signed)
Per ems: pt called for headache, takes hydrocodone for pain at home but ran out 3 weeks ago and was told by his family doctor that he could not refill it until later this week. Pt son told ems he has been to multiple facilities seeking pain meds this week. Pt does have slurred speech from old stroke. A&O en route, bp elevated states he takes his bp meds as prescribed.

## 2012-03-17 NOTE — ED Notes (Signed)
Patient transported to CT 

## 2012-08-05 ENCOUNTER — Emergency Department (HOSPITAL_BASED_OUTPATIENT_CLINIC_OR_DEPARTMENT_OTHER)
Admission: EM | Admit: 2012-08-05 | Discharge: 2012-08-05 | Disposition: A | Discharge status: Home / Self Care | Payer: Medicaid Other | Attending: Family Medicine | Admitting: Family Medicine

## 2012-08-05 ENCOUNTER — Emergency Department (HOSPITAL_BASED_OUTPATIENT_CLINIC_OR_DEPARTMENT_OTHER): Payer: Medicaid Other

## 2012-08-05 ENCOUNTER — Encounter (HOSPITAL_BASED_OUTPATIENT_CLINIC_OR_DEPARTMENT_OTHER): Payer: Self-pay | Admitting: *Deleted

## 2012-08-05 DIAGNOSIS — G8929 Other chronic pain: Secondary | ICD-10-CM | POA: Insufficient documentation

## 2012-08-05 DIAGNOSIS — M25511 Pain in right shoulder: Secondary | ICD-10-CM

## 2012-08-05 DIAGNOSIS — M549 Dorsalgia, unspecified: Secondary | ICD-10-CM | POA: Insufficient documentation

## 2012-08-05 DIAGNOSIS — Z7982 Long term (current) use of aspirin: Secondary | ICD-10-CM | POA: Insufficient documentation

## 2012-08-05 DIAGNOSIS — Z8673 Personal history of transient ischemic attack (TIA), and cerebral infarction without residual deficits: Secondary | ICD-10-CM | POA: Insufficient documentation

## 2012-08-05 DIAGNOSIS — F411 Generalized anxiety disorder: Secondary | ICD-10-CM | POA: Insufficient documentation

## 2012-08-05 DIAGNOSIS — F17200 Nicotine dependence, unspecified, uncomplicated: Secondary | ICD-10-CM | POA: Insufficient documentation

## 2012-08-05 DIAGNOSIS — M25519 Pain in unspecified shoulder: Secondary | ICD-10-CM | POA: Insufficient documentation

## 2012-08-05 DIAGNOSIS — Z9889 Other specified postprocedural states: Secondary | ICD-10-CM | POA: Insufficient documentation

## 2012-08-05 DIAGNOSIS — I252 Old myocardial infarction: Secondary | ICD-10-CM | POA: Insufficient documentation

## 2012-08-05 DIAGNOSIS — Z79899 Other long term (current) drug therapy: Secondary | ICD-10-CM | POA: Insufficient documentation

## 2012-08-05 DIAGNOSIS — Z880 Allergy status to penicillin: Secondary | ICD-10-CM | POA: Insufficient documentation

## 2012-08-05 DIAGNOSIS — I10 Essential (primary) hypertension: Secondary | ICD-10-CM | POA: Insufficient documentation

## 2012-08-05 MED ORDER — TRIAMCINOLONE ACETONIDE 40 MG/ML IJ SUSP
INTRAMUSCULAR | Status: AC
  Administered 2012-08-05: 12:00:00
  Filled 2012-08-05: qty 5

## 2012-08-05 MED ORDER — LIDOCAINE HCL 2 % IJ SOLN
INTRAMUSCULAR | Status: AC
  Administered 2012-08-05: 12:00:00
  Filled 2012-08-05: qty 20

## 2012-08-05 MED ORDER — KETOROLAC TROMETHAMINE 60 MG/2ML IM SOLN
INTRAMUSCULAR | Status: AC
  Administered 2012-08-05: 60 mg via INTRAMUSCULAR
  Filled 2012-08-05: qty 2

## 2012-08-05 MED ORDER — TRAMADOL HCL 50 MG PO TABS: 50.0000 mg | ORAL_TABLET | Freq: Four times a day (QID) | ORAL | Status: DC | PRN

## 2012-08-05 NOTE — ED Provider Notes (Signed)
History    CSN: [id number] Arrival date & time 08/05/12  1024  First MD Initiated Contact with Patient 08/05/12 1035     Chief Complaint  Patient presents with  . Shoulder Pain   (Consider location/radiation/quality/duration/timing/severity/associated sxs/prior Treatment)  Patient is a 52 y.o. male presenting with shoulder pain.  Shoulder Pain Associated symptoms include arthralgias. Pertinent negatives include no chest pain, joint swelling or myalgias.   Bilateral shoulder pain. Onset 2 weeks ago though chronic condition for pt. Achy and sharp at times. 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 Denies specific injury or acute event causing the pain. H/o numerous MSK injuries and apparent bilat rotator cuff repair. Associated w/ mild numbness traveling down arms to the level of the elbows. Improves w/ sitting in hot tub or hot shower. Difficulty performing ADLs. Used some Perc 10s that he had laying around from a previous prescription which helped. Advil 1,000mg  w/o benefit.    Past Medical History  Diagnosis Date  . Low back pain   . Anxiety   . Hypertension   . Stroke   . Myocardial infarction    Past Surgical History  Procedure Laterality Date  . Rib fracture surgery    . Rotator cuff repair     History reviewed. No pertinent family history. History  Substance Use Topics  . Smoking status: Current Every Day Smoker -- 1.00 packs/day for 13 years    Types: Cigarettes  . Smokeless tobacco: Not on file  . Alcohol Use: No    Review of Systems  Constitutional: Positive for activity change.  Respiratory: Negative for chest tightness.   Cardiovascular: Negative for chest pain.  Musculoskeletal: Positive for back pain and arthralgias. Negative for myalgias and joint swelling.  All other systems reviewed and are negative.    Allergies  Penicillins  Home Medications   Current Outpatient Rx  Name  Route  Sig  Dispense  Refill  .  aspirin 81 MG chewable tablet   Oral   Chew 81 mg by mouth daily.          . diazepam (VALIUM) 5 MG tablet   Oral   Take 5 mg by mouth every 12 (twelve) hours as needed. For anxiety         . HYDROcodone-acetaminophen (NORCO) 10-325 MG per tablet   Oral   Take 1 tablet by mouth every 8 (eight) hours as needed for pain.         . isosorbide mononitrate (IMDUR) 30 MG 24 hr tablet   Oral   Take 30 mg by mouth daily.         Marland Kitchen lisinopril (PRINIVIL,ZESTRIL) 10 MG tablet   Oral   Take 10 mg by mouth daily.         Marland Kitchen METOPROLOL TARTRATE PO   Oral   Take 1 tablet by mouth 2 (two) times daily.         . nitroGLYCERIN (NITROSTAT) 0.4 MG SL tablet   Sublingual   Place 0.4 mg under the tongue every 5 (five) minutes as needed. Chest pain           BP 187/117  Pulse 75  Temp(Src) 98.1 F (36.7 C) (Oral)  Resp 18  SpO2 100% Physical Exam  Constitutional: He appears well-developed and well-nourished. No distress.  HENT:  Head: Normocephalic and atraumatic.  Eyes: EOM are normal. Pupils are equal, round, and reactive to light.  Neck: Normal range of  motion.  Cardiovascular: Normal rate and regular rhythm.  Exam reveals no friction rub.   No murmur heard. Pulmonary/Chest: Effort normal and breath sounds normal. No respiratory distress. He has no wheezes. He exhibits no tenderness.  Abdominal: Soft. Bowel sounds are normal. He exhibits no distension.  Musculoskeletal:  Limite ROM in bilat shoulders secondary to pain. Arm abduction to about 45 degrees. Pt had hard time relaxing during exam. Intermittent TTP along supraspinatus and acromioclavicular joint. Empty can, and hawkins maneuvers + bilat. Could not perform hand behind back. No crepitus. Hand grip strength 5/5 bilat.    Dg Shoulder Right  08/05/2012   *RADIOLOGY REPORT*  Clinical Data: Shoulder pain  RIGHT SHOULDER - 2+ VIEW  Comparison: 04/25/2011  Findings: Three views of the right shoulder submitted.  No acute  fracture or subluxation.  Glenohumeral joint is preserved.  AC joint is unremarkable.  IMPRESSION: No acute fracture or subluxation.  No significant change.   Original Report Authenticated By: Natasha Mead, M.D.   Dg Shoulder Left  08/05/2012   *RADIOLOGY REPORT*  Clinical Data: Bilateral shoulder pain  LEFT SHOULDER - 2+ VIEW  Comparison: None.  Findings: Three views of the left shoulder submitted.  No acute fracture or subluxation.  No radiopaque foreign body.  Glenohumeral joint is preserved.  IMPRESSION: No acute fracture or subluxation.   Original Report Authenticated By: Natasha Mead, M.D.     ED Course  Procedures (including critical care time) Labs Reviewed - No data to display No results found. No diagnosis found.  MDM  52yo M w/ bilateral shoulder pain likely from chronic shoulder arthritis and rotator cuff tears. Acute exacerbation likely to benefit from scheduled NSAIDs and steroid injections. Pt ultimately needs to f/u w/ his Orthopedic surgeon. Narcotics not indicated for this pain.  - shoulder plain films ordered.  - Bilat shoulder injections - toradol   Plain film w/o evidence of acute abnormality. No chronic arthritic changes.  Pain much improved w/ injections. ROM dramatically improved. Pain likely from acromioclavicular joint pain vs bursitis vs rotator cuff injury. Ortho appt. Scheduled for 7/29 at [facility].  - Stable for DC - Tramadol sent to pharmacy - f/u Ortho - NSAIDs for relief  Shelly Flatten, MD Family Medicine PGY-3 08/05/2012, 12:48 PM   Procedure: BIlateral shoulder injection Verbal consent obtained. Medication:  1 cc Kenalog, and 3cc Lidocaine 1% without epi Preparation: area cleansed with alcohol Time Out taken  Injection  Landmarks identified 4cc of medication injected into joint space using a posterior approach Patient tolerated well without bleeding or paresthesias  Patient had good range of motion of joint after injection    Ozella Rocks, MD 08/05/12 1255

## 2012-08-05 NOTE — ED Provider Notes (Signed)
I saw and evaluated the patient, reviewed the resident's note and I agree with the findings and plan.  The patient presents with bilateral shoulder pain that he has had for some time.  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  He denies any injury or trauma.  There is no numbness or tingling.  On exam, the vitals are stable and the patient is afebrile.  The shoulders are noted to have pain with range of motion but appear grossly normal.  The ulnar and radial pulses are palpable and motor and sensory are intact distally.  Shoulders injected by Dr. Konrad Dolores under my supervision with good results.  He will be discharge to home with arrangements to be made for orthopedic follow up.  Geoffery Lyons, MD 08/05/12 [phone]

## 2012-08-05 NOTE — ED Notes (Signed)
Pt reports bilateral shoulder pain and chronic lower back pain.  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

## 2012-08-11 ENCOUNTER — Observation Stay (HOSPITAL_COMMUNITY): Payer: PRIVATE HEALTH INSURANCE

## 2012-08-11 ENCOUNTER — Encounter (HOSPITAL_COMMUNITY): Payer: Self-pay | Admitting: *Deleted

## 2012-08-11 ENCOUNTER — Inpatient Hospital Stay (HOSPITAL_COMMUNITY)
Admission: EM | Admit: 2012-08-11 | Discharge: 2012-08-14 | DRG: 251 | Disposition: A | Discharge status: Home / Self Care | Payer: PRIVATE HEALTH INSURANCE | Attending: Internal Medicine | Admitting: Internal Medicine

## 2012-08-11 DIAGNOSIS — Z79899 Other long term (current) drug therapy: Secondary | ICD-10-CM

## 2012-08-11 DIAGNOSIS — Z8249 Family history of ischemic heart disease and other diseases of the circulatory system: Secondary | ICD-10-CM

## 2012-08-11 DIAGNOSIS — R0789 Other chest pain: Principal | ICD-10-CM | POA: Diagnosis present

## 2012-08-11 DIAGNOSIS — F419 Anxiety disorder, unspecified: Secondary | ICD-10-CM | POA: Diagnosis present

## 2012-08-11 DIAGNOSIS — F1210 Cannabis abuse, uncomplicated: Secondary | ICD-10-CM | POA: Diagnosis present

## 2012-08-11 DIAGNOSIS — I214 Non-ST elevation (NSTEMI) myocardial infarction: Secondary | ICD-10-CM

## 2012-08-11 DIAGNOSIS — I69922 Dysarthria following unspecified cerebrovascular disease: Secondary | ICD-10-CM

## 2012-08-11 DIAGNOSIS — Z955 Presence of coronary angioplasty implant and graft: Secondary | ICD-10-CM

## 2012-08-11 DIAGNOSIS — F1410 Cocaine abuse, uncomplicated: Secondary | ICD-10-CM | POA: Diagnosis present

## 2012-08-11 DIAGNOSIS — Z880 Allergy status to penicillin: Secondary | ICD-10-CM

## 2012-08-11 DIAGNOSIS — I2510 Atherosclerotic heart disease of native coronary artery without angina pectoris: Secondary | ICD-10-CM | POA: Diagnosis present

## 2012-08-11 DIAGNOSIS — R29898 Other symptoms and signs involving the musculoskeletal system: Secondary | ICD-10-CM | POA: Diagnosis present

## 2012-08-11 DIAGNOSIS — Z720 Tobacco use: Secondary | ICD-10-CM | POA: Diagnosis present

## 2012-08-11 DIAGNOSIS — M545 Low back pain, unspecified: Secondary | ICD-10-CM | POA: Diagnosis present

## 2012-08-11 DIAGNOSIS — I10 Essential (primary) hypertension: Secondary | ICD-10-CM | POA: Diagnosis present

## 2012-08-11 DIAGNOSIS — I252 Old myocardial infarction: Secondary | ICD-10-CM

## 2012-08-11 DIAGNOSIS — G8929 Other chronic pain: Secondary | ICD-10-CM | POA: Diagnosis present

## 2012-08-11 DIAGNOSIS — Z7902 Long term (current) use of antithrombotics/antiplatelets: Secondary | ICD-10-CM

## 2012-08-11 DIAGNOSIS — R079 Chest pain, unspecified: Secondary | ICD-10-CM

## 2012-08-11 DIAGNOSIS — I429 Cardiomyopathy, unspecified: Secondary | ICD-10-CM

## 2012-08-11 DIAGNOSIS — I428 Other cardiomyopathies: Secondary | ICD-10-CM

## 2012-08-11 DIAGNOSIS — I200 Unstable angina: Secondary | ICD-10-CM

## 2012-08-11 DIAGNOSIS — I2589 Other forms of chronic ischemic heart disease: Secondary | ICD-10-CM | POA: Diagnosis present

## 2012-08-11 DIAGNOSIS — Z7982 Long term (current) use of aspirin: Secondary | ICD-10-CM

## 2012-08-11 DIAGNOSIS — F17200 Nicotine dependence, unspecified, uncomplicated: Secondary | ICD-10-CM | POA: Diagnosis present

## 2012-08-11 DIAGNOSIS — R9439 Abnormal result of other cardiovascular function study: Secondary | ICD-10-CM | POA: Diagnosis present

## 2012-08-11 DIAGNOSIS — I69998 Other sequelae following unspecified cerebrovascular disease: Secondary | ICD-10-CM

## 2012-08-11 DIAGNOSIS — K219 Gastro-esophageal reflux disease without esophagitis: Secondary | ICD-10-CM | POA: Diagnosis present

## 2012-08-11 DIAGNOSIS — F411 Generalized anxiety disorder: Secondary | ICD-10-CM

## 2012-08-11 DIAGNOSIS — I255 Ischemic cardiomyopathy: Secondary | ICD-10-CM | POA: Diagnosis present

## 2012-08-11 HISTORY — DX: Acute myocardial infarction, unspecified: I21.9

## 2012-08-11 HISTORY — DX: Migraine, unspecified, not intractable, without status migrainosus: G43.909

## 2012-08-11 HISTORY — DX: Low back pain, unspecified: M54.50

## 2012-08-11 HISTORY — DX: Low back pain: M54.5

## 2012-08-11 HISTORY — DX: Unspecified osteoarthritis, unspecified site: M19.90

## 2012-08-11 HISTORY — DX: Gastro-esophageal reflux disease without esophagitis: K21.9

## 2012-08-11 HISTORY — DX: Unspecified fracture of skull, initial encounter for closed fracture: S02.91XA

## 2012-08-11 HISTORY — DX: Other chronic pain: G89.29

## 2012-08-11 LAB — POCT I-STAT, CHEM 8
Calcium, Ion: 1.13 mmol/L (ref 1.12–1.23)
Chloride: 107 mEq/L (ref 96–112)
HCT: 46 % (ref 39.0–52.0)
Hemoglobin: 15.6 g/dL (ref 13.0–17.0)
Potassium: 4.3 mEq/L (ref 3.5–5.1)

## 2012-08-11 LAB — CBC WITH DIFFERENTIAL/PLATELET
Eosinophils Absolute: 0.1 10*3/uL (ref 0.0–0.7)
Eosinophils Relative: 1 % (ref 0–5)
HCT: 44.1 % (ref 39.0–52.0)
Hemoglobin: 14 g/dL (ref 13.0–17.0)
Lymphs Abs: 2.9 10*3/uL (ref 0.7–4.0)
MCH: 27.6 pg (ref 26.0–34.0)
MCV: 87 fL (ref 78.0–100.0)
Monocytes Relative: 9 % (ref 3–12)
RBC: 5.07 MIL/uL (ref 4.22–5.81)

## 2012-08-11 LAB — RAPID URINE DRUG SCREEN, HOSP PERFORMED
Barbiturates: NOT DETECTED
Cocaine: NOT DETECTED
Tetrahydrocannabinol: POSITIVE — AB

## 2012-08-11 LAB — MRSA PCR SCREENING: MRSA by PCR: NEGATIVE

## 2012-08-11 LAB — POCT I-STAT TROPONIN I: Troponin i, poc: 0 ng/mL (ref 0.00–0.08)

## 2012-08-11 MED ORDER — HYDROCODONE-ACETAMINOPHEN 10-325 MG PO TABS
1.0000 | ORAL_TABLET | Freq: Four times a day (QID) | ORAL | Status: DC | PRN
  Administered 2012-08-11 – 2012-08-13 (×4): 1 via ORAL
  Filled 2012-08-11 (×4): qty 1

## 2012-08-11 MED ORDER — METOPROLOL TARTRATE 25 MG PO TABS
25.0000 mg | ORAL_TABLET | Freq: Two times a day (BID) | ORAL | Status: DC
  Administered 2012-08-11: 25 mg via ORAL
  Filled 2012-08-11: qty 1

## 2012-08-11 MED ORDER — NITROGLYCERIN 0.4 MG SL SUBL
0.4000 mg | SUBLINGUAL_TABLET | SUBLINGUAL | Status: DC | PRN
  Administered 2012-08-12 – 2012-08-14 (×6): 0.4 mg via SUBLINGUAL
  Filled 2012-08-11: qty 25

## 2012-08-11 MED ORDER — ONDANSETRON HCL 4 MG/2ML IJ SOLN: 4.0000 mg | Freq: Four times a day (QID) | INTRAMUSCULAR | Status: DC | PRN

## 2012-08-11 MED ORDER — HEPARIN (PORCINE) IN NACL 100-0.45 UNIT/ML-% IJ SOLN
1350.0000 [IU]/h | INTRAMUSCULAR | Status: DC
  Administered 2012-08-11: 1100 [IU]/h via INTRAVENOUS
  Administered 2012-08-12 – 2012-08-13 (×2): 1350 [IU]/h via INTRAVENOUS
  Filled 2012-08-11 (×4): qty 250

## 2012-08-11 MED ORDER — GI COCKTAIL ~~LOC~~
30.0000 mL | Freq: Four times a day (QID) | ORAL | Status: DC | PRN
  Administered 2012-08-11 – 2012-08-13 (×3): 30 mL via ORAL
  Filled 2012-08-11 (×5): qty 30

## 2012-08-11 MED ORDER — NITROGLYCERIN 2 % TD OINT
1.0000 [in_us] | TOPICAL_OINTMENT | Freq: Once | TRANSDERMAL | Status: AC
  Administered 2012-08-11: 1 [in_us] via TOPICAL
  Filled 2012-08-11: qty 1

## 2012-08-11 MED ORDER — ATORVASTATIN CALCIUM 80 MG PO TABS
80.0000 mg | ORAL_TABLET | Freq: Every day | ORAL | Status: DC
  Administered 2012-08-11 – 2012-08-13 (×3): 80 mg via ORAL
  Filled 2012-08-11 (×6): qty 1

## 2012-08-11 MED ORDER — HEPARIN BOLUS VIA INFUSION
4000.0000 [IU] | Freq: Once | INTRAVENOUS | Status: AC
  Administered 2012-08-11: 4000 [IU] via INTRAVENOUS

## 2012-08-11 MED ORDER — REGADENOSON 0.4 MG/5ML IV SOLN
0.4000 mg | Freq: Once | INTRAVENOUS | Status: AC
  Filled 2012-08-11: qty 5

## 2012-08-11 MED ORDER — CLONIDINE HCL 0.1 MG PO TABS
0.1000 mg | ORAL_TABLET | Freq: Two times a day (BID) | ORAL | Status: DC
  Administered 2012-08-11 – 2012-08-14 (×6): 0.1 mg via ORAL
  Filled 2012-08-11 (×9): qty 1

## 2012-08-11 MED ORDER — SODIUM CHLORIDE 0.9 % IV SOLN
Freq: Once | INTRAVENOUS | Status: AC
  Administered 2012-08-11: 13:00:00 via INTRAVENOUS

## 2012-08-11 MED ORDER — ASPIRIN 81 MG PO CHEW: 324.0000 mg | CHEWABLE_TABLET | Freq: Once | ORAL | Status: DC

## 2012-08-11 MED ORDER — ONDANSETRON HCL 4 MG/2ML IJ SOLN: 4.0000 mg | Freq: Three times a day (TID) | INTRAMUSCULAR | Status: DC | PRN

## 2012-08-11 MED ORDER — DIAZEPAM 5 MG PO TABS
5.0000 mg | ORAL_TABLET | Freq: Two times a day (BID) | ORAL | Status: DC | PRN
Start: 2012-08-11 — End: 2012-08-14
  Administered 2012-08-11 – 2012-08-14 (×4): 5 mg via ORAL
  Filled 2012-08-11 (×4): qty 1

## 2012-08-11 MED ORDER — ASPIRIN 81 MG PO CHEW
81.0000 mg | CHEWABLE_TABLET | Freq: Every day | ORAL | Status: DC
  Administered 2012-08-12 – 2012-08-14 (×2): 81 mg via ORAL
  Filled 2012-08-11 (×3): qty 1
  Filled 2012-08-11: qty 4

## 2012-08-11 MED ORDER — NICOTINE 21 MG/24HR TD PT24
21.0000 mg | MEDICATED_PATCH | Freq: Every day | TRANSDERMAL | Status: DC
  Administered 2012-08-12 – 2012-08-13 (×2): 21 mg via TRANSDERMAL
  Filled 2012-08-11 (×3): qty 1

## 2012-08-11 MED ORDER — ACETAMINOPHEN 325 MG PO TABS
650.0000 mg | ORAL_TABLET | ORAL | Status: DC | PRN
  Administered 2012-08-11: 650 mg via ORAL
  Filled 2012-08-11: qty 2

## 2012-08-11 NOTE — ED Provider Notes (Signed)
History    CSN: [id number] Arrival date & time 08/11/12  1223  First MD Initiated Contact with Patient 08/11/12 1223     Chief Complaint  Patient presents with  . Chest Pain   (Consider location/radiation/quality/duration/timing/severity/associated sxs/prior Treatment) HPI Comments: 52 year old male with a history of hypertension, cocaine abuse, prior stroke and a non-ST elevation MI which occurred in 2012 presents with a complaint of left-sided chest pain. He is very vague in his explanations and he does have prior head injury making it difficult to get any information on the patient however he does state that he has been having intermittent left-sided pains, nothing seems to make it better or worse, they are intermittent, coming go and sometimes are associated with exertion. He denies fevers chills nausea vomiting coughing or shortness of breath. He has no swelling of his legs, no recent injuries, travel or trauma. He is a smoker, states that he does take blood pressure medication but did not bring any of his medications with him. According to the discharge summary from December of 2012 he was supposed to be taking Plavix in addition to his antihypertensives.  Patient is a 52 y.o. male presenting with chest pain. The history is provided by the patient, medical records and the EMS personnel.  Chest Pain  Past Medical History  Diagnosis Date  . Low back pain   . Anxiety   . Hypertension   . Stroke   . Myocardial infarction    Past Surgical History  Procedure Laterality Date  . Rib fracture surgery    . Rotator cuff repair     No family history on file. History  Substance Use Topics  . Smoking status: Current Every Day Smoker -- 1.00 packs/day for 13 years    Types: Cigarettes  . Smokeless tobacco: Not on file  . Alcohol Use: No    Review of Systems  Cardiovascular: Positive for chest pain.  All other systems reviewed and are negative.    Allergies  Penicillins  Home  Medications   Current Outpatient Rx  Name  Route  Sig  Dispense  Refill  . aspirin 81 MG chewable tablet   Oral   Chew 81 mg by mouth daily.          . cloNIDine (CATAPRES) 0.1 MG tablet   Oral   Take 0.1 mg by mouth 2 (two) times daily.         . diazepam (VALIUM) 5 MG tablet   Oral   Take 5 mg by mouth every 12 (twelve) hours as needed. For anxiety         . HYDROcodone-acetaminophen (NORCO) 10-325 MG per tablet   Oral   Take 1 tablet by mouth every 8 (eight) hours as needed for pain.         Marland Kitchen losartan-hydrochlorothiazide (HYZAAR) 100-25 MG per tablet   Oral   Take 1 tablet by mouth daily.         . nitroGLYCERIN (NITROSTAT) 0.4 MG SL tablet   Sublingual   Place 0.4 mg under the tongue every 5 (five) minutes as needed. Chest pain           BP 154/113  Pulse 84  Temp(Src) 98.1 F (36.7 C) (Oral)  Resp 20  Wt 170 lb (77.111 kg)  BMI 26.62 kg/m2  SpO2 98% Physical Exam  Nursing note and vitals reviewed. Constitutional: He appears well-developed and well-nourished. No distress.  HENT:  Head: Normocephalic and atraumatic.  Mouth/Throat: Oropharynx is  clear and moist. No oropharyngeal exudate.  Eyes: Conjunctivae and EOM are normal. Pupils are equal, round, and reactive to light. Right eye exhibits no discharge. Left eye exhibits no discharge. No scleral icterus.  Neck: Normal range of motion. Neck supple. No JVD present. No thyromegaly present.  Cardiovascular: Regular rhythm, normal heart sounds and intact distal pulses.  Exam reveals no gallop and no friction rub.   No murmur heard. Mild tachycardia to 110  Pulmonary/Chest: Effort normal and breath sounds normal. No respiratory distress. He has no wheezes. He has no rales.  Abdominal: Soft. Bowel sounds are normal. He exhibits no distension and no mass. There is no tenderness.  Musculoskeletal: Normal range of motion. He exhibits no edema and no tenderness.  Lymphadenopathy:    He has no cervical  adenopathy.  Neurological: He is alert. Coordination normal.  Skin: Skin is warm and dry. No rash noted. No erythema.  Psychiatric: He has a normal mood and affect. His behavior is normal.    ED Course  Procedures (including critical care time) Labs Reviewed  CBC WITH DIFFERENTIAL - Abnormal; Notable for the following:    WBC 13.2 (*)    Neutro Abs 9.0 (*)    Monocytes Absolute 1.2 (*)    All other components within normal limits  URINE RAPID DRUG SCREEN (HOSP PERFORMED) - Abnormal; Notable for the following:    Opiates POSITIVE (*)    Tetrahydrocannabinol POSITIVE (*)    All other components within normal limits  POCT I-STAT, CHEM 8 - Abnormal; Notable for the following:    Glucose, Bld 120 (*)    All other components within normal limits  POCT I-STAT TROPONIN I   Dg Chest Port 1 View  08/11/2012   *RADIOLOGY REPORT*  Clinical Data: Chest pain.  PORTABLE CHEST - 1 VIEW  Comparison: No priors.  Findings: Lung volumes are normal.  No consolidative airspace disease.  No pleural effusions.  No pneumothorax.  No pulmonary nodule or mass noted.  Pulmonary vasculature and the cardiomediastinal silhouette are within normal limits.  IMPRESSION: 1. No radiographic evidence of acute cardiopulmonary disease.   Original Report Authenticated By: Trudie Reed, M.D.   1. Chest pain     MDM  The patient has an essentially normal neurologic exam except for his mildly garbled speech but given his history of traumatic brain injury there is some question as to whether this is his baseline. The paramedics reported no significant findings in route except for hypertension, he was given nitroglycerin with some improvement in his symptoms at this time has no chest pain. Given his history of cocaine abuse, stroke, hypertension which at this time appears elevated at 165/118 I suspect that he could have occlusive disease causing his symptoms. His EKG shows some hyperacute T waves but no findings of acute ST  elevations or depressions. Will obtain some blood work, anticipate the patient will need to be admitted due to his risk and increasing frequency of chest pain.  ED ECG REPORT  I personally interpreted this EKG   Date: 08/11/2012   Rate: 109  Rhythm: sinus tachycardia  QRS Axis: normal  Intervals: normal  ST/T Wave abnormalities: nonspecific ST/T changes  Conduction Disutrbances:none  Narrative Interpretation: T waves are slightly peaked in the precordial leads.  Old EKG Reviewed: none available  Pt has norml troponin - has high risk for ischemic but no overt evidence of acute iscehmia.   BP is persistently elevated.  Pt has been admitted to Dr. Manson Passey of  the IM teaching service.    Vida Roller, MD 08/11/12 [phone]

## 2012-08-11 NOTE — ED Notes (Signed)
Pt requesting pain med for his chronic back pain & something to eat. Informed pt waiting on admitting MD.

## 2012-08-11 NOTE — ED Notes (Signed)
Admitting MD at bedside.

## 2012-08-11 NOTE — ED Notes (Addendum)
States left side CP extends down to left lower rib area & has been intermittent. Denies n/v, SOB, diaphoresis. States started coughing today. Pain worsens with mvmt, palpation & deep breaths. Pt reports took his BP meds this morning.

## 2012-08-11 NOTE — H&P (Signed)
Date: 08/11/2012               Patient Name:  Austin Simmons MRN: [id number]  DOB: May 30, 1960 Age / Sex: 52 y.o., male   PCP: Provider Default, MD         Medical Service: Internal Medicine Teaching Service         Attending Physician: Dr. Rocco Serene, MD    First Contact: Dr. Aundria Rud Pager: [phone]  Second Contact: Dr. 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  Pt states that the chest pain has been going on for approximately 2 weeks and is a crampy pressure-like pain that radiates to his left arm with associated numbness in the arm.  Pain began at rest when he was lying in bed, associated with diaphoresis.  Pt states that pain is mildly relieved for an hour or two when he takes four nitroglycerin tablets.  Pt states that this pain is different from and more painful than his previous NSTEMI.  No trauma or injury.  Pain is better with activity, and pt states he sometimes gets up to "walk off the pain."  Pt states that pain is worse after eating acidic foods like raw tomatoes. Pain is also worse with deep inspiration.   In ED, troponin x 1 negative, UDS negative.   Of note, pt has a significant first degree family history of CAD with his father passing away from an MI when pt was a young child.   Pt states he saw his PCP about 3 months ago but the physician has since passed away (?).    Meds: Current Facility-Administered Medications  Medication Dose Route Frequency Provider Last Rate Last Dose  . cloNIDine (CATAPRES) tablet 0.1 mg  0.1 mg Oral BID Linward Headland, MD      . diazepam (VALIUM) tablet 5 mg  5 mg Oral Q12H PRN Linward Headland, MD       . gi cocktail (Maalox,Lidocaine,Donnatal)  30 mL Oral QID PRN Linward Headland, MD      . HYDROcodone-acetaminophen [facility]) 10-325 MG per tablet 1 tablet  1 tablet Oral Q6H PRN Linward Headland, MD      . metoprolol tartrate (LOPRESSOR) tablet 25 mg  25 mg Oral BID Linward Headland, MD       Current Outpatient Prescriptions  Medication Sig Dispense Refill  . aspirin 81 MG chewable tablet Chew 81 mg by mouth daily.       . cloNIDine (CATAPRES) 0.1 MG tablet Take 0.1 mg by mouth 2 (two) times daily.      . diazepam (VALIUM) 5 MG tablet Take 5 mg by mouth every 12 (twelve) hours as needed. For anxiety      . HYDROcodone-acetaminophen (NORCO) 10-325 MG per tablet Take 1 tablet by mouth every 8 (eight) hours as needed for pain.      Marland Kitchen losartan-hydrochlorothiazide (HYZAAR) 100-25 MG per tablet Take 1 tablet by mouth daily.      . nitroGLYCERIN (NITROSTAT) 0.4 MG SL tablet Place 0.4 mg under the tongue every 5 (five) minutes as needed. Chest pain  Allergies: Allergies as of 08/11/2012 - Review Complete 08/11/2012  Allergen Reaction Noted  . Penicillins Rash 01/08/2011   Past Medical History  Diagnosis Date  . Low back pain   . Anxiety   . Hypertension   . Stroke   . Myocardial infarction    Past Surgical History  Procedure Laterality Date  . Rib fracture surgery    . Rotator cuff repair     No family history on file. History   Social History  . Marital Status: Married    Spouse Name: N/A    Number of Children: N/A  . Years of Education: N/A   Occupational History  . Not on file.   Social History Main Topics  . Smoking status: Current Every Day Smoker -- 1.00 packs/day for 13 years    Types: Cigarettes  . Smokeless tobacco: Not on file  . Alcohol Use: No  . Drug Use: Yes    Special: Marijuana  . Sexually Active: Not on file   Other Topics Concern  . Not on file   Social History Narrative   ** Merged History Encounter **        Review of Systems: ROS General:  no fevers, chills, changes in weight, changes in appetite Skin: no rash HEENT: no blurry vision, hearing changes, sore throat Pulm: no dyspnea, coughing, wheezing CV: see HPI Abd: + abdominal pain after acidic foods, no nausea/vomiting, diarrhea/constipation GU: no dysuria, hematuria, polyuria Ext: no arthralgias, myalgias Neuro: +numbness in left arm, no weakness or tingling   Physical Exam: Blood pressure 150/95, pulse 106, temperature 98.1 F (36.7 C), temperature source Oral, resp. rate 31, weight 170 lb (77.111 kg), SpO2 99.00%. General: alert, cooperative, and in no apparent distress HEENT: vision grossly intact, oropharynx clear and non-erythematous  Neck: supple, no lymphadenopathy, JVD, or carotid bruits Lungs: clear to ascultation bilaterally, normal work of respiration, no wheezes, rales, ronchi Chest: pain not reproduced with palpation  Heart: regular rate and rhythm, no murmurs, gallops, or rubs Abdomen: soft, non-tender, non-distended, normal bowel sounds Extremities: no cyanosis, clubbing, or edema Neurologic: alert & oriented X3, cranial nerves II-XII intact, strength grossly intact, sensation intact to light touch   Lab results: Basic Metabolic Panel:  Recent Labs  29/56/21 1339  NA 139  K 4.3  CL 107  GLUCOSE 120*  BUN 22  CREATININE 0.90   CBC:  Recent Labs  08/11/12 1339 08/11/12 1340  WBC  --  13.2*  NEUTROABS  --  9.0*  HGB 15.6 14.0  HCT 46.0 44.1  MCV  --  87.0  PLT  --  371   Urine Drug Screen: Drugs of Abuse     Component Value Date/Time   LABOPIA POSITIVE* 08/11/2012 1330   COCAINSCRNUR NONE DETECTED 08/11/2012 1330   LABBENZ NONE DETECTED 08/11/2012 1330   AMPHETMU NONE DETECTED 08/11/2012 1330   THCU POSITIVE* 08/11/2012 1330   LABBARB NONE DETECTED 08/11/2012 1330     Imaging results:  Dg Chest Port 1 View  08/11/2012   *RADIOLOGY REPORT*  Clinical Data: Chest pain.  PORTABLE CHEST - 1 VIEW  Comparison: No priors.  Findings:  Lung volumes are normal.  No consolidative airspace disease.  No pleural effusions.  No pneumothorax.  No pulmonary nodule or mass noted.  Pulmonary vasculature and the cardiomediastinal silhouette are within normal limits.  IMPRESSION: 1. No radiographic evidence of acute cardiopulmonary disease.   Original Report Authenticated By: Trudie Reed, M.D.    Other results: EKG: sinus tachycardia  with old J point elevation in V1-V3; will repeat due to poor quality trace from this afternoon  Assessment & Plan by Problem: Mr. 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   #Atypical chest pain- Unlikely ACS given different pain from prior NSTEMI/crampy in nature, constant for last 2 weeks, actually improved with activity, only mildly relieved with nitroglycerin. However, given pt's cardiac history of NSTEMI, family history of father who passed away from MI in 30s/40s, and risk factors including tobacco abuse and ?HTN, concern for ACS.  Also TIMI score of 1-2 (+/- >3 risk fx if count HTN, ASA) so low probability for NSTEMI pending negative troponins x 3.  Revised Geneva score of 5 (only tachycardia >96) so moderate probability for PE.  Also on differential are GERD (pain worsened by acidic foods) vs. MSK pain (given crampy nature of pain, 2 week duration).   CXR with no evidence of acute cardiopulmonary disease.  Pt will not be able to complete exercise stress test due to residual weakness after CVA in 2012 so will order chemical stress test. UDS positive for opiates and THC. -repeat EKG -scheduled pt for Lexiscan myoview in morning  -heparin gtt -cycle troponins -holding metoprolol for stress test in AM -atorvastatin -ASA 81ng -nitro prn    #Nicotine abuse- Pt interested in smoking cessation and requesting nicotine patch. Will provide counseling tomorrow.   #DVT PPX-  lovenox  #Full code  Dispo: Disposition is deferred at this time, awaiting improvement of current medical problems. Anticipated discharge in approximately 1-2 day(s) pending stress test results.   The patient does not have a current PCP (Provider Default, MD) and does need an [facility] hospital follow-up appointment after discharge.  Signed: Rocco Serene, MD 08/11/2012, 5:32 PM

## 2012-08-11 NOTE — ED Notes (Addendum)
C/o constant left side CP x 2 weeks. Took NTG x 4 prior to EMS arrival. Pt now denies any CP presently but does c/o chronic back pain, took half a pain pill, reports was last one. Given ASA 324mg  by EMS. Reports high BP 162/118

## 2012-08-11 NOTE — Progress Notes (Signed)
Called to get report from nurse [place], [place]. Monica unable to give report at thus time. She said she will cal back.

## 2012-08-11 NOTE — ED Notes (Signed)
Attempted to call report. Pt continues to have CP 5/10. Unable to transfer to floor. Will medicate for CP as ordered

## 2012-08-11 NOTE — ED Notes (Signed)
MD at bedside. 

## 2012-08-11 NOTE — Progress Notes (Signed)
ANTICOAGULATION CONSULT NOTE - Initial Consult  Pharmacy Consult for heparin Indication: chest pain/ACS  Allergies  Allergen Reactions  . Penicillins Rash         Patient Measurements: Weight: 170 lb (77.111 kg) Heparin Dosing Weight: 77.1kg  Vital Signs: Temp: 98.1 F (36.7 C) (07/22 1234) Temp src: Oral (07/22 1234) BP: 150/95 mmHg (07/22 1700) Pulse Rate: 106 (07/22 1700)  Labs:  Recent Labs  08/11/12 1339 08/11/12 1340  HGB 15.6 14.0  HCT 46.0 44.1  PLT  --  371  CREATININE 0.90  --     The CrCl is unknown because both a height and weight (above a minimum accepted value) are required for this calculation.   Medical History: Past Medical History  Diagnosis Date  . Low back pain   . Anxiety   . Hypertension   . Stroke   . Myocardial infarction     Medications:  Infusions:  . heparin    . heparin      Assessment: 51 yom presented to the ED with CP to start IV heparin. Pt is not on any anticoagulants PTA and H/H and plts are WNL. Troponins negative so far.   Goal of Therapy:  Heparin level 0.3-0.7 units/ml Monitor platelets by anticoagulation protocol: Yes   Plan:  1. Heparin bolus 4000 units IV x 1 2. Heparin inf 1100 units/hr 3. Check a 6 hour heparin level 4. Daily heparin level and CBC  Devontay Celaya, Drake Leach 08/11/2012,5:53 PM

## 2012-08-12 ENCOUNTER — Observation Stay (HOSPITAL_COMMUNITY): Payer: PRIVATE HEALTH INSURANCE

## 2012-08-12 ENCOUNTER — Encounter (HOSPITAL_COMMUNITY): Payer: Self-pay | Admitting: Interventional Cardiology

## 2012-08-12 DIAGNOSIS — R079 Chest pain, unspecified: Secondary | ICD-10-CM

## 2012-08-12 DIAGNOSIS — I255 Ischemic cardiomyopathy: Secondary | ICD-10-CM | POA: Diagnosis present

## 2012-08-12 LAB — CBC
HCT: 43.5 % (ref 39.0–52.0)
Hemoglobin: 13.8 g/dL (ref 13.0–17.0)
MCH: 27.9 pg (ref 26.0–34.0)
MCV: 88.1 fL (ref 78.0–100.0)
Platelets: 421 10*3/uL — ABNORMAL HIGH (ref 150–400)
RBC: 4.94 MIL/uL (ref 4.22–5.81)
WBC: 15 10*3/uL — ABNORMAL HIGH (ref 4.0–10.5)

## 2012-08-12 LAB — HEPARIN LEVEL (UNFRACTIONATED)
Heparin Unfractionated: 0.23 IU/mL — ABNORMAL LOW (ref 0.30–0.70)
Heparin Unfractionated: 0.17 IU/mL — ABNORMAL LOW (ref 0.30–0.70)

## 2012-08-12 MED ORDER — ACETAMINOPHEN 325 MG PO TABS: 650.0000 mg | ORAL_TABLET | ORAL | Status: DC | PRN

## 2012-08-12 MED ORDER — PANTOPRAZOLE SODIUM 40 MG PO TBEC
40.0000 mg | DELAYED_RELEASE_TABLET | Freq: Two times a day (BID) | ORAL | Status: DC
  Administered 2012-08-12 – 2012-08-14 (×5): 40 mg via ORAL
  Filled 2012-08-12 (×6): qty 1

## 2012-08-12 MED ORDER — SODIUM CHLORIDE 0.9 % IV SOLN
INTRAVENOUS | Status: DC
  Administered 2012-08-13: 06:00:00 via INTRAVENOUS

## 2012-08-12 MED ORDER — TECHNETIUM TC 99M SESTAMIBI GENERIC - CARDIOLITE
10.0000 | Freq: Once | INTRAVENOUS | Status: AC | PRN
  Administered 2012-08-12: 10 via INTRAVENOUS

## 2012-08-12 MED ORDER — SODIUM CHLORIDE 0.9 % IJ SOLN: 3.0000 mL | INTRAMUSCULAR | Status: DC | PRN

## 2012-08-12 MED ORDER — REGADENOSON 0.4 MG/5ML IV SOLN
INTRAVENOUS | Status: AC
  Administered 2012-08-12: 0.4 mg via INTRAVENOUS
  Filled 2012-08-12: qty 5

## 2012-08-12 MED ORDER — ASPIRIN 81 MG PO CHEW
324.0000 mg | CHEWABLE_TABLET | ORAL | Status: AC
  Administered 2012-08-13: 324 mg via ORAL
  Filled 2012-08-12: qty 4

## 2012-08-12 MED ORDER — MORPHINE SULFATE 2 MG/ML IJ SOLN
1.0000 mg | INTRAMUSCULAR | Status: DC | PRN
  Administered 2012-08-12 – 2012-08-13 (×4): 2 mg via INTRAVENOUS
  Filled 2012-08-12 (×4): qty 1

## 2012-08-12 MED ORDER — SODIUM CHLORIDE 0.9 % IJ SOLN
3.0000 mL | Freq: Two times a day (BID) | INTRAMUSCULAR | Status: DC
  Administered 2012-08-13: 3 mL via INTRAVENOUS

## 2012-08-12 MED ORDER — TECHNETIUM TC 99M SESTAMIBI GENERIC - CARDIOLITE
30.0000 | Freq: Once | INTRAVENOUS | Status: AC | PRN
  Administered 2012-08-12: 30 via INTRAVENOUS

## 2012-08-12 MED ORDER — ONDANSETRON HCL 4 MG/2ML IJ SOLN: 4.0000 mg | Freq: Four times a day (QID) | INTRAMUSCULAR | Status: DC | PRN

## 2012-08-12 MED ORDER — HEPARIN BOLUS VIA INFUSION
2000.0000 [IU] | Freq: Once | INTRAVENOUS | Status: AC
  Administered 2012-08-12: 2000 [IU] via INTRAVENOUS
  Filled 2012-08-12: qty 2000

## 2012-08-12 MED ORDER — SODIUM CHLORIDE 0.9 % IV SOLN
250.0000 mL | INTRAVENOUS | Status: DC | PRN
  Administered 2012-08-13: 250 mL via INTRAVENOUS

## 2012-08-12 MED ORDER — DIAZEPAM 5 MG PO TABS
5.0000 mg | ORAL_TABLET | ORAL | Status: AC
  Administered 2012-08-13: 5 mg via ORAL
  Filled 2012-08-12: qty 1

## 2012-08-12 MED ORDER — METOPROLOL TARTRATE 50 MG PO TABS
50.0000 mg | ORAL_TABLET | Freq: Two times a day (BID) | ORAL | Status: DC
  Administered 2012-08-12 – 2012-08-14 (×4): 50 mg via ORAL
  Filled 2012-08-12 (×6): qty 1

## 2012-08-12 NOTE — Progress Notes (Signed)
Pt c/o 9/10 L sided CP; BP 161/101, pt given 2 SLNTG, with some relief, CP down to a 4; pt refusing 3rd nitro; MD paged and made aware of pts cp

## 2012-08-12 NOTE — Progress Notes (Signed)
ANTICOAGULATION CONSULT NOTE - Follow Up Consult  Pharmacy Consult for heparin Indication: chest pain/ACS  Labs:  Recent Labs  08/11/12 1339 08/11/12 1340 08/11/12 1905 08/12/12 0120 08/12/12 0128  HGB 15.6 14.0  --  13.8  --   HCT 46.0 44.1  --  43.5  --   PLT  --  371  --  421*  --   HEPARINUNFRC  --   --   --   --  0.23*  CREATININE 0.90  --   --   --   --   TROPONINI  --   --  <0.30  --   --     Assessment: 52yo male with subtherapeutic heparin level though RN found heparin gtt to be leaking x30-88min just prior to lab being drawn.  Goal of Therapy:  Heparin level 0.3-0.7 units/ml   Plan:  Given inaccuracy of lab and assumption level would have been higher had gtt not been interrupted, will continue gtt at current rate for now and recheck level.  Vernard Gambles, PharmD, BCPS  08/12/2012,2:08 AM

## 2012-08-12 NOTE — Progress Notes (Signed)
Pt c/o L sided CP; pt states it started 5 mins after he took a GI cocktail; pt also had just finished eating [place] food and laid down; VSS, EKG obtained, no changes noted from pervious EKGs; Spoke with Dr.Rogers and made aware, stated she felt it was more GERD related and not to give SL NTG; vicodin given per pt request, will continue to monitor

## 2012-08-12 NOTE — Progress Notes (Signed)
ANTICOAGULATION CONSULT NOTE - Follow Up Consult  Pharmacy Consult:  Heparin Indication:  ACS  Allergies  Allergen Reactions  . Penicillins Rash         Patient Measurements: Height: 5\' 7"  (170.2 cm) Weight: 188 lb 12.8 oz (85.639 kg) IBW/kg (Calculated) : 66.1 Heparin Dosing Weight: 77 kg  Vital Signs: Temp: 97.5 F (36.4 C) (07/23 0649) Temp src: Oral (07/23 0649) BP: 164/100 mmHg (07/23 0945) Pulse Rate: 67 (07/23 0649)  Labs:  Recent Labs  08/11/12 1339 08/11/12 1340 08/11/12 1905 08/12/12 0120 08/12/12 0128 08/12/12 0856  HGB 15.6 14.0  --  13.8  --   --   HCT 46.0 44.1  --  43.5  --   --   PLT  --  371  --  421*  --   --   HEPARINUNFRC  --   --   --   --  0.23* 0.17*  CREATININE 0.90  --   --   --   --   --   TROPONINI  --   --  <0.30 <0.30  --   --     Estimated Creatinine Clearance: 103.8 ml/min (by C-G formula based on Cr of 0.9).      Assessment: 28 YOM admitted with chest pain and started on IV heparin.  Heparin level sub-therapeutic.  No bleeding reported and no complication with infusion.  Currently in stress test.   Goal of Therapy:  Heparin level 0.3-0.7 units/ml Monitor platelets by anticoagulation protocol: Yes    Plan:  - Rebolus with 2000 units IV x 1, then - Increase heparin gtt to 1350 units/hr - Check 6 hr HL - Daily HL / CBC - Watch WBC    Audryna Wendt D. Laney Potash, PharmD, BCPS Pager:  [phone] 08/12/2012, 2:29 PM

## 2012-08-12 NOTE — Progress Notes (Signed)
Pt still continues to c/o CP; MD paged and made aware, was told they believe it is GERD and that omeprazole will be added, but did not want to give pt anymore pain medicine; will continue to monitor

## 2012-08-12 NOTE — Progress Notes (Signed)
Utilization review completed.  

## 2012-08-12 NOTE — Progress Notes (Signed)
Subjective: Mr. Ringwald is doing about the same this morning, states that his chest pain is somewhat improved.  His told us on rounds today that the pain he is experiencing comes on with spicy or acidic foods, tends to be worse when lying down and when he turns onto his side; pain is constant but also worse with stress.    Objective: Vital signs in last 24 hours: Filed Vitals:   08/12/12 1310 08/12/12 1312 08/12/12 1314 08/12/12 1513  BP: 150/92 149/97 142/99 150/92  Pulse:    64  Temp:    97.9 F (36.6 C)  TempSrc:    Oral  Resp:    18  Height:      Weight:      SpO2:       Weight change:   Intake/Output Summary (Last 24 hours) at 08/12/12 1543 Last data filed at 08/12/12 1218  Gross per 24 hour  Intake      0 ml  Output    500 ml  Net   -500 ml   PEX General: alert, cooperative, and in no apparent distress HEENT: vision grossly intact, oropharynx clear and non-erythematous  Neck: supple, no lymphadenopathy, JVD, or carotid bruits Lungs: clear to ascultation bilaterally, normal work of respiration, no wheezes, rales, ronchi Heart: regular rate and rhythm, no murmurs, gallops, or rubs Abdomen: soft, non-tender, non-distended, normal bowel sounds Extremities: no cyanosis, clubbing, or edema Neurologic: alert & oriented X3, cranial nerves II-XII intact, strength grossly intact, sensation intact to light touch  Lab Results: Basic Metabolic Panel:  Recent Labs Lab 08/11/12 1339  NA 139  K 4.3  CL 107  GLUCOSE 120*  BUN 22  CREATININE 0.90   CBC:  Recent Labs Lab 08/11/12 1340 08/12/12 0120  WBC 13.2* 15.0*  NEUTROABS 9.0*  --   HGB 14.0 13.8  HCT 44.1 43.5  MCV 87.0 88.1  PLT 371 421*   Cardiac Enzymes:  Recent Labs Lab 08/11/12 1905 08/12/12 0120  TROPONINI <0.30 <0.30   Urine Drug Screen: Drugs of Abuse     Component Value Date/Time   LABOPIA POSITIVE* 08/11/2012 1330   COCAINSCRNUR NONE DETECTED 08/11/2012 1330   LABBENZ NONE DETECTED  08/11/2012 1330   AMPHETMU NONE DETECTED 08/11/2012 1330   THCU POSITIVE* 08/11/2012 1330   LABBARB NONE DETECTED 08/11/2012 1330     Micro Results: Recent Results (from the past 240 hour(s))  MRSA PCR SCREENING     Status: None   Collection Time    08/11/12  8:08 PM      Result Value Range Status   MRSA by PCR NEGATIVE  NEGATIVE Final   Comment:            The GeneXpert MRSA Assay (FDA     approved for NASAL specimens     only), is one component of a     comprehensive MRSA colonization     surveillance program. It is not     intended to diagnose MRSA     infection nor to guide or     monitor treatment for     MRSA infections.   Studies/Results: Dg Chest 2 View  08/12/2012   *RADIOLOGY REPORT*  Clinical Data: Chest pain, shortness of breath, cough, smoker, right side lesion on a previous chest x-ray  CHEST - 2 VIEW  Comparison: 08/11/2012  Findings: Normal heart size, mediastinal contours, and pulmonary vascularity. Minimal peribronchial thickening. No acute infiltrate, pleural effusion or pneumothorax. Bones appear demineralized.  IMPRESSION:  Minimal bronchitic changes. No acute abnormalities.   Original Report Authenticated By: Ulyses Southward, M.D.   Nm Myocar Multi W/spect W/wall Motion / Ef  08/12/2012   *RADIOLOGY REPORT*  Clinical Data:  Chest pain  MYOCARDIAL IMAGING WITH SPECT (REST AND PHARMACOLOGIC-STRESS) GATED LEFT VENTRICULAR WALL MOTION STUDY LEFT VENTRICULAR EJECTION FRACTION  Technique:  Standard myocardial SPECT imaging was performed after resting intravenous injection of 10 mCi Tc-45m sestamibi. Subsequently, intravenous infusion of lexiscan was performed under the supervision of the Cardiology staff.  At peak effect of the drug, 30 mCi Tc-55m sestamibi was injected intravenously and standard myocardial SPECT  imaging was performed.  Quantitative gated imaging was also performed to evaluate left ventricular wall motion, and estimate left ventricular ejection fraction.   Comparison:  Chest radiograph - earlier same day  Findings:  Review of the rotational raw images demonstrates mild patient motion attenuation, worse on the rest images in comparison to the stress.  SPECT imaging demonstrates mild dilatation of the left ventricular cavity.  There is a moderate to large area of decreased radiotracer uptake involving the inferior-lateral wall of the left ventricle which appears to worsen on the provided stress images.  There is a potential area of mismatched perfusion involving the left ventricular septum, again, worse on the provided stress images.  Quantitative gated analysis shows global hypokinesia with small geographic area of dyskinesia involving the inferior wall towards the left ventricular apex.  The resting left ventricular ejection fraction is 25% with end- diastolic volume of 99 ml and end-systolic volume of 74 ml.  IMPRESSION: 1.  Findings are worrisome for prior infarction involving the inferior lateral wall of the left ventricle with suspected minimal amount of peri infarct ischemia. 2.  Suspected additional area of ischemia involving the left ventricular septum. 3. Global hypokinesia with a geographic area of dyskinesia involving the inferior wall towards the left ventricular apex. Ejection fraction - 25%.  Above findings discussed with Dr. Josem Kaufmann at 15:21.   Original Report Authenticated By: Tacey Ruiz, MD   Dg Chest Port 1 View  08/11/2012   *RADIOLOGY REPORT*  Clinical Data: Chest pain.  PORTABLE CHEST - 1 VIEW  Comparison: No priors.  Findings: Lung volumes are normal.  No consolidative airspace disease.  No pleural effusions.  No pneumothorax.  No pulmonary nodule or mass noted.  Pulmonary vasculature and the cardiomediastinal silhouette are within normal limits.  IMPRESSION: 1. No radiographic evidence of acute cardiopulmonary disease.   Original Report Authenticated By: Trudie Reed, M.D.   Medications: I have reviewed the patient's current  medications. Scheduled Meds: . aspirin  81 mg Oral Daily  . atorvastatin  80 mg Oral q1800  . cloNIDine  0.1 mg Oral BID  . nicotine  21 mg Transdermal Daily  . pantoprazole  40 mg Oral BID   Continuous Infusions: . heparin 1,350 Units/hr (08/12/12 1442)   PRN Meds:.acetaminophen, diazepam, gi cocktail, HYDROcodone-acetaminophen, nitroGLYCERIN, ondansetron (ZOFRAN) IV Assessment/Plan: Mr. 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   #Atypical chest pain- Thought unlikely to be ACS given different pain from prior NSTEMI/crampy in nature, constant for last 2 weeks, actually improved with activity, only mildly relieved with nitroglycerin. However, given pt's cardiac history of NSTEMI, family history of father who passed away from MI in 30s/40s, and risk factors including tobacco abuse and ?HTN, concern for ACS. TIMI  score of 1-2 (?HTN, ASA) so low probability for NSTEMI.  Also on differential are GERD (pain worsened by acidic foods) vs. MSK pain (given crampy nature of pain, 2 week duration).  CXR on 7/22 with no evidence of acute cardiopulmonary disease. However, on review this morning, small well demarcated area in RLL concerning for possible pneumonia.  Will repeat CXR with PA and lateral views.  Eugenie Birks myoview on 7/23 abnormal: prior infarction of inferior lateral wall of left ventricle with minimal peri infarct ischemia, suspected additional area of ischemia involving the left ventricular septum, global hypokinesia with a geographic area of dyskinesia involving the inferior wall towards the left ventricular apex. Ejection fraction of 25%. Deboraha Sprang cardiology consult- assessment for catheterization -continue heparin gtt -repeat EKG  -repeat CXR per above -restart metoprolol 25 mg PO BID after procedure -continue atorvastatin, ASA 81ng, nitro prn   #HTN-  Pt's systolic BPs 130s-160s this admission. Holding losartan due to possible contrast load with cath; holding HCTZ for variable PO intake in setting of multiple cardiac procedures.   #Nicotine abuse- Pt interested in smoking cessation.  Will provide education. -continue nicotine patch   Dispo: Disposition is deferred at this time, awaiting improvement of current medical problems.  Anticipated discharge in approximately 2-3 day(s).   The patient does not have a current PCP (Provider Default, MD) and does need an [facility] hospital follow-up appointment after discharge.  .Services Needed at time of discharge: Y = Yes, Blank = No PT:   OT:   RN:   Equipment:   Other:     LOS: 1 day   Rocco Serene, MD 08/12/2012, 3:43 PM

## 2012-08-12 NOTE — H&P (Signed)
I saw and evaluated the patient. I reviewed the resident's note and confirmed the resident's findings.  I agree with the assessment and plan as documented in the resident's note.  Briefly, Austin Simmons is a 52 yo man with a history of NSTEMI in December 2012 presumably secondary to cocaine abuse, CVA with residual left sided weakness, and a reported mild anoxic brain injury who presents with 2 weeks of chest pain which worsened with anxiety and high emotions.    The pain was a pressure-like sensation that radiated down his left arm.  It was associated with diaphoresis and was relieved with NTG SL X 4.  He was therefore admitted to the Internal Medicine Teaching Service for further evaluation of this possible ACS.  He ruled out via troponin X 3.  A nuclear stress test did reveal some mild peri-infarct ischemic inferiorly, reversible septal ischemic, and an LVEF of 25% (declined since 12/2010).  We will consult Cardiology to assess patient's candidacy for cardiac cath to better assess underlying coronary anatomy and look for intervenable lesions.  Further evaluation and care pending Cardiology's assessment.
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
## 2012-08-12 NOTE — Progress Notes (Signed)
Chaplain responded to spiritual care consult from pt requesting "prayer." Consult was generated upon admission last evening, but pt had a fresh prayer concern now as he had just learned today that he needs open heart surgery. Pt was tearful as he told me this. Pt said he has a "weak heart" and also has "depression." We did not get to talk about the latter. Pt expressed eagerness to have the surgery done as soon as possible. 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 His two sons work with him in this business.

## 2012-08-12 NOTE — Progress Notes (Signed)
ANTICOAGULATION CONSULT NOTE - Follow Up Consult  Pharmacy Consult:  Heparin Indication:  ACS  Allergies  Allergen Reactions  . Penicillins Rash        Patient Measurements: Height: 5\' 7"  (170.2 cm) Weight: 188 lb 12.8 oz (85.639 kg) IBW/kg (Calculated) : 66.1 Heparin Dosing Weight: 77 kg  Vital Signs: Temp: 98 F (36.7 C) (07/23 2009) Temp src: Oral (07/23 2009) BP: 169/119 mmHg (07/23 2009) Pulse Rate: 95 (07/23 2009)  Labs:  Recent Labs  08/11/12 1339 08/11/12 1340 08/11/12 1905 08/12/12 0120 08/12/12 0128 08/12/12 0856 08/12/12 2243  HGB 15.6 14.0  --  13.8  --   --   --   HCT 46.0 44.1  --  43.5  --   --   --   PLT  --  371  --  421*  --   --   --   HEPARINUNFRC  --   --   --   --  0.23* 0.17* 0.52  CREATININE 0.90  --   --   --   --   --   --   TROPONINI  --   --  <0.30 <0.30  --   --   --    Estimated Creatinine Clearance: 103.8 ml/min (by C-G formula based on Cr of 0.9).  Assessment: 64 YOM admitted with chest pain and started on IV heparin.  Heparin level is now within therapeutic range at 0.52 IU/ml and no bleeding reported.  Noted plans for cardiac cath for ongoing work up.  Goal of Therapy:  Heparin level 0.3-0.7 units/ml Monitor platelets by anticoagulation protocol: Yes   Plan:  -   Will continue IV heparin gtt at 1350 units/hr -   Check daily HL / CBC  Nadara Mustard, PharmD., MS Clinical Pharmacist Pager:  [phone] Thank you for allowing pharmacy to be part of this patients care team. 08/12/2012, 11:55 PM

## 2012-08-13 ENCOUNTER — Encounter (HOSPITAL_COMMUNITY)
Admission: EM | Disposition: A | Discharge status: Home / Self Care | Payer: Self-pay | Source: Home / Self Care | Attending: Internal Medicine

## 2012-08-13 ENCOUNTER — Encounter (HOSPITAL_COMMUNITY): Payer: Self-pay | Admitting: Interventional Cardiology

## 2012-08-13 DIAGNOSIS — I10 Essential (primary) hypertension: Secondary | ICD-10-CM

## 2012-08-13 DIAGNOSIS — F17200 Nicotine dependence, unspecified, uncomplicated: Secondary | ICD-10-CM

## 2012-08-13 DIAGNOSIS — I5020 Unspecified systolic (congestive) heart failure: Secondary | ICD-10-CM

## 2012-08-13 DIAGNOSIS — I2510 Atherosclerotic heart disease of native coronary artery without angina pectoris: Secondary | ICD-10-CM

## 2012-08-13 DIAGNOSIS — I428 Other cardiomyopathies: Secondary | ICD-10-CM

## 2012-08-13 DIAGNOSIS — I200 Unstable angina: Secondary | ICD-10-CM | POA: Insufficient documentation

## 2012-08-13 DIAGNOSIS — F1210 Cannabis abuse, uncomplicated: Secondary | ICD-10-CM

## 2012-08-13 HISTORY — PX: PERCUTANEOUS CORONARY STENT INTERVENTION (PCI-S): SHX5485

## 2012-08-13 HISTORY — PX: LEFT HEART CATHETERIZATION WITH CORONARY ANGIOGRAM: SHX5451

## 2012-08-13 LAB — BASIC METABOLIC PANEL
CO2: 28 mEq/L (ref 19–32)
Chloride: 103 mEq/L (ref 96–112)
Sodium: 139 mEq/L (ref 135–145)

## 2012-08-13 LAB — POCT ACTIVATED CLOTTING TIME
Activated Clotting Time: 217 seconds
Activated Clotting Time: 242 seconds

## 2012-08-13 LAB — PROTIME-INR
INR: 0.94 (ref 0.00–1.49)
Prothrombin Time: 12.4 seconds (ref 11.6–15.2)

## 2012-08-13 LAB — HEPARIN LEVEL (UNFRACTIONATED): Heparin Unfractionated: 0.59 IU/mL (ref 0.30–0.70)

## 2012-08-13 SURGERY — LEFT HEART CATHETERIZATION WITH CORONARY ANGIOGRAM
Anesthesia: Moderate Sedation

## 2012-08-13 MED ORDER — HYDROCHLOROTHIAZIDE 25 MG PO TABS
25.0000 mg | ORAL_TABLET | Freq: Every day | ORAL | Status: DC
  Administered 2012-08-13 – 2012-08-14 (×2): 25 mg via ORAL
  Filled 2012-08-13 (×2): qty 1

## 2012-08-13 MED ORDER — FENTANYL CITRATE 0.05 MG/ML IJ SOLN
INTRAMUSCULAR | Status: AC
  Filled 2012-08-13: qty 2

## 2012-08-13 MED ORDER — MORPHINE SULFATE 2 MG/ML IJ SOLN
INTRAMUSCULAR | Status: AC
  Filled 2012-08-13: qty 1

## 2012-08-13 MED ORDER — MIDAZOLAM HCL 2 MG/2ML IJ SOLN
INTRAMUSCULAR | Status: AC
  Filled 2012-08-13: qty 2

## 2012-08-13 MED ORDER — ACETAMINOPHEN 325 MG PO TABS: 650.0000 mg | ORAL_TABLET | ORAL | Status: DC | PRN

## 2012-08-13 MED ORDER — FAMOTIDINE IN NACL 20-0.9 MG/50ML-% IV SOLN
INTRAVENOUS | Status: AC
  Filled 2012-08-13: qty 50

## 2012-08-13 MED ORDER — HYDROCHLOROTHIAZIDE 25 MG PO TABS: 25.0000 mg | ORAL_TABLET | Freq: Every day | ORAL | Status: DC

## 2012-08-13 MED ORDER — SODIUM CHLORIDE 0.9 % IV SOLN: INTRAVENOUS | Status: AC

## 2012-08-13 MED ORDER — LOSARTAN POTASSIUM 50 MG PO TABS: 100.0000 mg | ORAL_TABLET | Freq: Every day | ORAL | Status: DC

## 2012-08-13 MED ORDER — ENOXAPARIN SODIUM 40 MG/0.4ML ~~LOC~~ SOLN
40.0000 mg | SUBCUTANEOUS | Status: DC
  Filled 2012-08-13: qty 0.4

## 2012-08-13 MED ORDER — HEPARIN SODIUM (PORCINE) 1000 UNIT/ML IJ SOLN
INTRAMUSCULAR | Status: AC
  Filled 2012-08-13: qty 1

## 2012-08-13 MED ORDER — CLOPIDOGREL BISULFATE 300 MG PO TABS
ORAL_TABLET | ORAL | Status: AC
  Filled 2012-08-13: qty 2

## 2012-08-13 MED ORDER — ONDANSETRON HCL 4 MG/2ML IJ SOLN: 4.0000 mg | Freq: Four times a day (QID) | INTRAMUSCULAR | Status: DC | PRN

## 2012-08-13 MED ORDER — LIDOCAINE HCL (PF) 1 % IJ SOLN
INTRAMUSCULAR | Status: AC
  Filled 2012-08-13: qty 30

## 2012-08-13 MED ORDER — LOSARTAN POTASSIUM 50 MG PO TABS
100.0000 mg | ORAL_TABLET | Freq: Every day | ORAL | Status: DC
  Administered 2012-08-13 – 2012-08-14 (×2): 100 mg via ORAL
  Filled 2012-08-13 (×2): qty 2

## 2012-08-13 MED ORDER — VERAPAMIL HCL 2.5 MG/ML IV SOLN
INTRAVENOUS | Status: AC
  Filled 2012-08-13: qty 2

## 2012-08-13 MED ORDER — HEPARIN (PORCINE) IN NACL 2-0.9 UNIT/ML-% IJ SOLN
INTRAMUSCULAR | Status: AC
  Filled 2012-08-13: qty 1000

## 2012-08-13 MED ORDER — KETOROLAC TROMETHAMINE 30 MG/ML IJ SOLN
30.0000 mg | Freq: Once | INTRAMUSCULAR | Status: AC
  Administered 2012-08-13: 30 mg via INTRAVENOUS
  Filled 2012-08-13: qty 1

## 2012-08-13 MED ORDER — CLOPIDOGREL BISULFATE 75 MG PO TABS
75.0000 mg | ORAL_TABLET | Freq: Every day | ORAL | Status: DC
  Administered 2012-08-14: 09:00:00 75 mg via ORAL
  Filled 2012-08-13: qty 1

## 2012-08-13 MED ORDER — HYDROCODONE-ACETAMINOPHEN 10-325 MG PO TABS
1.0000 | ORAL_TABLET | ORAL | Status: DC | PRN
  Administered 2012-08-13 – 2012-08-14 (×3): 1 via ORAL
  Filled 2012-08-13 (×3): qty 1

## 2012-08-13 NOTE — CV Procedure (Signed)
PROCEDURE:  PCI LAD, radial artery angiogram  INDICATIONS:  Unstable angina, cardiomyopathy, persistent chest pain at rest  The risks, benefits, and details of the procedure were explained to the patient.  The patient verbalized understanding and wanted to proceed.  Informed written consent was obtained.  PROCEDURE TECHNIQUE:  Diagnostic cath was done by Dr. Anne Fu. 80-90% mid LAD lesion noted. PCI was done using a CLS 3.0 guide catheter.  There was difficulty getting catheter through the radial artery.  A radial artery angiogram was done though the sheath showing severe vasospasm in the mid right radial artery.  This was treated with verapamil and NTG.  Prowater wire was used to cross LAD lesion.  2.5 x 12 Trek balloon was used to predilate.  A 4.0 x 12 Veriflex was deployed and postdilated with a 4.0 x 8 to 16 ATm.  No residual stenosis with TIMI 3 flow.     CONTRAST:  Total of 60 cc.  COMPLICATIONS:  None.      IMPRESSIONS:  1. Successful PCI of left anterior descending artery with a 4.0 x 12 Veriflex stent, postdilated to 4.1 mm. 2. Vasospasm of the right radial artery treated with IA NTG and Verapamil.  RECOMMENDATION:  Aspirin and Plavix for at least a month.  Aggressive risk factor modification. F/u with Dr. Anne Fu.

## 2012-08-13 NOTE — Progress Notes (Signed)
Subjective: Austin Simmons is doing fine this morning, NPO preparing for his cardiac catheterization.  He states that his chest pain is about the same and seems worse when he presses on his chest.  He feels that he needs stronger pain medication.   Objective: Vital signs in last 24 hours: Filed Vitals:   08/12/12 1755 08/12/12 1800 08/12/12 2009 08/13/12 0452  BP: 145/97 150/96 169/119 151/99  Pulse:   95 69  Temp:   98 F (36.7 C) 98 F (36.7 C)  TempSrc:   Oral Oral  Resp:   18 18  Height:      Weight:      SpO2:   99% 99%   Weight change:   Intake/Output Summary (Last 24 hours) at 08/13/12 1610 Last data filed at 08/13/12 0522  Gross per 24 hour  Intake      0 ml  Output   1375 ml  Net  -1375 ml   PEX General: alert, cooperative, and in no apparent distress HEENT: vision grossly intact, oropharynx clear and non-erythematous  Neck: supple, no lymphadenopathy, JVD, or carotid bruits Lungs: clear to ascultation bilaterally, normal work of respiration, no wheezes, rales, ronchi Heart: regular rate and rhythm, no murmurs, gallops, or rubs Abdomen: soft, non-tender, non-distended, normal bowel sounds Extremities: no cyanosis, clubbing, or edema Neurologic: alert & oriented X3, cranial nerves II-XII intact, strength grossly intact, sensation intact to light touch  Lab Results: Basic Metabolic Panel:  Recent Labs Lab 08/11/12 1339 08/13/12 0436  NA 139 139  K 4.3 4.4  CL 107 103  CO2  --  28  GLUCOSE 120* 118*  BUN 22 21  CREATININE 0.90 1.06  CALCIUM  --  9.1   CBC:  Recent Labs Lab 08/11/12 1340 08/12/12 0120  WBC 13.2* 15.0*  NEUTROABS 9.0*  --   HGB 14.0 13.8  HCT 44.1 43.5  MCV 87.0 88.1  PLT 371 421*   Cardiac Enzymes:  Recent Labs Lab 08/11/12 1905 08/12/12 0120  TROPONINI <0.30 <0.30   Urine Drug Screen: Drugs of Abuse     Component Value Date/Time   LABOPIA POSITIVE* 08/11/2012 1330   COCAINSCRNUR NONE DETECTED 08/11/2012 1330   LABBENZ NONE DETECTED 08/11/2012 1330   AMPHETMU NONE DETECTED 08/11/2012 1330   THCU POSITIVE* 08/11/2012 1330   LABBARB NONE DETECTED 08/11/2012 1330     Micro Results: Recent Results (from the past 240 hour(s))  MRSA PCR SCREENING     Status: None   Collection Time    08/11/12  8:08 PM      Result Value Range Status   MRSA by PCR NEGATIVE  NEGATIVE Final   Comment:            The GeneXpert MRSA Assay (FDA     approved for NASAL specimens     only), is one component of a     comprehensive MRSA colonization     surveillance program. It is not     intended to diagnose MRSA     infection nor to guide or     monitor treatment for     MRSA infections.   Studies/Results: Dg Chest 2 View  08/12/2012   *RADIOLOGY REPORT*  Clinical Data: Chest pain, shortness of breath, cough, smoker, right side lesion on a previous chest x-ray  CHEST - 2 VIEW  Comparison: 08/11/2012  Findings: Normal heart size, mediastinal contours, and pulmonary vascularity. Minimal peribronchial thickening. No acute infiltrate, pleural effusion or pneumothorax. Bones appear demineralized.  IMPRESSION: Minimal bronchitic changes. No acute abnormalities.   Original Report Authenticated By: Ulyses Southward, M.D.   Nm Myocar Multi W/spect W/wall Motion / Ef  08/12/2012   *RADIOLOGY REPORT*  Clinical Data:  Chest pain  MYOCARDIAL IMAGING WITH SPECT (REST AND PHARMACOLOGIC-STRESS) GATED LEFT VENTRICULAR WALL MOTION STUDY LEFT VENTRICULAR EJECTION FRACTION  Technique:  Standard myocardial SPECT imaging was performed after resting intravenous injection of 10 mCi Tc-57m sestamibi. Subsequently, intravenous infusion of lexiscan was performed under the supervision of the Cardiology staff.  At peak effect of the drug, 30 mCi Tc-71m sestamibi was injected intravenously and standard myocardial SPECT  imaging was performed.  Quantitative gated imaging was also performed to evaluate left ventricular wall motion, and estimate left ventricular  ejection fraction.  Comparison:  Chest radiograph - earlier same day  Findings:  Review of the rotational raw images demonstrates mild patient motion attenuation, worse on the rest images in comparison to the stress.  SPECT imaging demonstrates mild dilatation of the left ventricular cavity.  There is a moderate to large area of decreased radiotracer uptake involving the inferior-lateral wall of the left ventricle which appears to worsen on the provided stress images.  There is a potential area of mismatched perfusion involving the left ventricular septum, again, worse on the provided stress images.  Quantitative gated analysis shows global hypokinesia with small geographic area of dyskinesia involving the inferior wall towards the left ventricular apex.  The resting left ventricular ejection fraction is 25% with end- diastolic volume of 99 ml and end-systolic volume of 74 ml.  IMPRESSION: 1.  Findings are worrisome for prior infarction involving the inferior lateral wall of the left ventricle with suspected minimal amount of peri infarct ischemia. 2.  Suspected additional area of ischemia involving the left ventricular septum. 3. Global hypokinesia with a geographic area of dyskinesia involving the inferior wall towards the left ventricular apex. Ejection fraction - 25%.  Above findings discussed with Dr. Josem Kaufmann at 15:21.   Original Report Authenticated By: Tacey Ruiz, MD   Dg Chest Port 1 View  08/11/2012   *RADIOLOGY REPORT*  Clinical Data: Chest pain.  PORTABLE CHEST - 1 VIEW  Comparison: No priors.  Findings: Lung volumes are normal.  No consolidative airspace disease.  No pleural effusions.  No pneumothorax.  No pulmonary nodule or mass noted.  Pulmonary vasculature and the cardiomediastinal silhouette are within normal limits.  IMPRESSION: 1. No radiographic evidence of acute cardiopulmonary disease.   Original Report Authenticated By: Trudie Reed, M.D.   Medications: I have reviewed the patient's  current medications. Scheduled Meds: . aspirin  81 mg Oral Daily  . atorvastatin  80 mg Oral q1800  . cloNIDine  0.1 mg Oral BID  . diazepam  5 mg Oral On Call  . metoprolol tartrate  50 mg Oral BID  . nicotine  21 mg Transdermal Daily  . pantoprazole  40 mg Oral BID  . sodium chloride  3 mL Intravenous Q12H   Continuous Infusions: . sodium chloride 75 mL/hr at 08/13/12 4098  . 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   #Atypical chest pain- Thought unlikely  to be ACS given different pain from prior NSTEMI/crampy in nature, constant for last 2 weeks, actually improved with activity, only mildly relieved with nitroglycerin. However, given pt's cardiac history of NSTEMI, family history of father who passed away from MI in 30s/40s, and risk factors including tobacco abuse and ?HTN, concern for ACS. TIMI score of 1-2 (?HTN, ASA) so low probability for NSTEMI.  Also on differential are GERD (pain worsened by acidic foods) vs. MSK pain (given crampy nature of pain, 2 week duration).  CXR on 7/22 with no evidence of acute cardiopulmonary disease. However, on review, small well-demarcated area in RLL concerning for possible pneumonia.  Area of concern not present on repeat CXR on 7/23. Eugenie Birks myoview on 7/23 abnormal: prior infarction of inferior lateral wall of left ventricle with minimal peri infarct ischemia, suspected additional area of ischemia involving the left ventricular septum, global hypokinesia with a geographic area of dyskinesia involving  the inferior wall towards the left ventricular apex. Ejection fraction of 25%. Deboraha Sprang cardiology consult, appreciate recs -NPO in preparation for cath today -d/c heparin gtt, pending cardiology recs  -restart metoprolol 25 mg PO BID after procedure  -will restart losartan/hctz after procedure -continue atorvastatin, ASA 81ng, nitro prn   #HTN- Pt's systolic BPs 130s-160s this admission. Holding losartan due to possible contrast load with cath; holding HCTZ for variable PO intake in setting of multiple cardiac procedures.  -restart losartan/hctz per above  #Nicotine abuse- Pt interested in smoking cessation.  Will provide education. -continue nicotine patch   Dispo: Disposition is deferred at this time, awaiting improvement of current medical problems.  Anticipated discharge in approximately 1-2 day(s).   The patient does not have a current PCP (Provider Default, MD) and does need an [facility] follow-up appointment after discharge.  [facility] on [address] Wed 8/13 at 1:30p.  Marland KitchenServices Needed at time of discharge: Y = Yes, Blank = No PT:   OT:   RN:   Equipment:   Other:     LOS: 2 days   Rocco Serene, MD 08/13/2012, 8:22 AM

## 2012-08-13 NOTE — Progress Notes (Signed)
TR BAND REMOVAL  LOCATION:  right radial  DEFLATED PER PROTOCOL:  yes  TIME BAND OFF / DRESSING APPLIED:   1730   SITE UPON ARRIVAL:   Level 0  SITE AFTER BAND REMOVAL:  Level 1  REVERSE ALLEN'S TEST:    positive  CIRCULATION SENSATION AND MOVEMENT:  Within Normal Limits  yes  COMMENTS:  Patient reminded many times to keep arm from moving I placed a board under arm as reminder level 1 at site

## 2012-08-13 NOTE — Progress Notes (Signed)
ANTICOAGULATION CONSULT NOTE - Follow Up Consult  Pharmacy Consult:  Heparin Indication:  ACS  Allergies  Allergen Reactions  . Penicillins Rash        Patient Measurements: Height: 5\' 7"  (170.2 cm) Weight: 188 lb 12.8 oz (85.639 kg) IBW/kg (Calculated) : 66.1 Heparin Dosing Weight: 77 kg  Vital Signs: Temp: 98 F (36.7 C) (07/24 0452) Temp src: Oral (07/24 0452) BP: 151/99 mmHg (07/24 0452) Pulse Rate: 69 (07/24 0452)  Labs:  Recent Labs  08/11/12 1339 08/11/12 1340 08/11/12 1905 08/12/12 0120  08/12/12 0856 08/12/12 2243 08/13/12 0436  HGB 15.6 14.0  --  13.8  --   --   --   --   HCT 46.0 44.1  --  43.5  --   --   --   --   PLT  --  371  --  421*  --   --   --   --   HEPARINUNFRC  --   --   --   --   < > 0.17* 0.52 0.59  CREATININE 0.90  --   --   --   --   --   --  1.06  TROPONINI  --   --  <0.30 <0.30  --   --   --   --   < > = values in this interval not displayed. Estimated Creatinine Clearance: 88.1 ml/min (by C-G formula based on Cr of 1.06).      Assessment: 22 YOM admitted with chest pain and started on IV heparin.  Heparin level therapeutic; no bleeding reported.  Noted plan for cath today.   Goal of Therapy:  Heparin level 0.3-0.7 units/ml Monitor platelets by anticoagulation protocol: Yes    Plan:  - Heparin gtt at 1350 units/hr - Daily HL / CBC - F/U post cath     Leshia Kope D. Laney Potash, PharmD, BCPS Pager:  [phone] 08/13/2012, 8:21 AM

## 2012-08-13 NOTE — CV Procedure (Signed)
CARDIAC CATHETERIZATION  PROCEDURE:  Left heart catheterization with selective coronary angiography, left ventriculogram via the radial artery approach.  INDICATIONS:  52 year old male with newly discovered cardiomyopathy, ejection fraction 25% on nuclear stress test, high risk nuclear stress test, with prior history of myocardial infarction in the setting of cocaine use. Urinary tox was negative on this admission for cocaine. His ejection fraction was normal 2 years ago. He has been experiencing chest tightness, orthopnea, and atypical chest pain at times left-sided mostly surrounding stressful situations/yelling he states. He has had a prior CVA with some residual weakness/dysarthria.  The risks, benefits, and details of the procedure were explained to the patient, including possibilities of stroke, heart attack, death, renal impairment, arterial damage, bleeding.  The patient verbalized understanding and wanted to proceed.  Informed written consent was obtained.  PROCEDURE TECHNIQUE:  Allen's test was performed pre-and post procedure and was normal. The right radial artery site was prepped and draped in a sterile fashion. One percent lidocaine was used for local anesthesia. Using the modified Seldinger technique a 5 French hydrophilic sheath was inserted into the radial artery without difficulty. 3 mg of verapamil was administered via the sheath. A Judkins right #4 catheter with the guidance of a Versicore wire was placed in the right coronary cusp and selectively cannulated the right coronary artery. After traversing the aortic arch, 4000 units of heparin IV was administered. A Judkins left #3.5 catheter was used to selectively cannulate the left main artery. Multiple views with hand injection of Omnipaque were obtained. Catheter a pigtail catheter was used to cross into the left ventricle, hemodynamics were obtained, and a left ventriculogram was performed in the RAO position with power injection.  Following the procedure, sheath was removed, patient was hemodynamically stable, hemostasis was maintained with a Terumo T band.   CONTRAST:  Total of 70 ml.    FLOUROSCOPY TIME: 2.5 min.  COMPLICATIONS:  None.    HEMODYNAMICS:  Aortic pressure was 125/76 mmHg; LV systolic pressure was 125 mmHg; LVEDP 12 mmHg.  There was no gradient between the left ventricle and aorta.    ANGIOGRAPHIC DATA:    Left main: Widely patent and branches into the LAD as well as circumflex artery. There is no angiographically significant disease.  Left anterior descending (LAD): There is a mid LAD lesion, quite eccentric, best seen in LAO projections that is 90% in severity, focal. Does not appear to be calcified. There is one small caliber diagonal branch proximal to this lesion. The LAD then continues to wrap around the apex.  Circumflex artery (CIRC): 2 obtuse marginal branches, no angiographic significant disease  Right coronary artery (RCA): There is a distal RCA lesion of approximately 60-70% prior to the takeoff of the posterior descending artery. There are also posterior lateral branches noted.  LEFT VENTRICULOGRAM:  Left ventricular angiogram was done in the 30 RAO projection and revealed generalized hypokinesis with reduced systolic function estimated ejection fraction of 30%.   IMPRESSIONS:  Focal mid LAD stenosis of approximately 90%, distal RCA stenosis of 60-70%.  Reduced left ventricular systolic function. Generalized hypokinesis.  LVEDP 12 mmHg.  Ejection fraction 30%.  RECOMMENDATION:  We will proceed with percutaneous intervention to the LAD, bare-metal stent. Continue to monitor clinically. If necessary, can proceed with RCA. I'm concerned that it'll be challenging to determine whether or not his chest discomfort is chest wall or myocardial in the future. Nonetheless, we will continue with aggressive medical management. Beta blocker, ACE inhibitor. We will reassess ejection  fraction in the  future.

## 2012-08-13 NOTE — Progress Notes (Signed)
Subjective:  States that his chest pain was quite significant overnight. He points to an isolated location in his lateral pectoralis region that is tender to palpation. He states that he has been squeezing his chest and this is one reason why it hurts. He reminded me that during anxiety provoking experiences such as yelling, his chest hurts. When screaming for his dog to come to him, he states that his chest hurts. There certainly some atypical components to his chest pain that point toward a more musculoskeletal etiology.  Objective:  Vital Signs in the last 24 hours: Temp:  [97.9 F (36.6 C)-98 F (36.7 C)] 98 F (36.7 C) (07/24 0452) Pulse Rate:  [64-95] 69 (07/24 0452) Resp:  [18] 18 (07/24 0452) BP: (130-169)/(92-119) 151/99 mmHg (07/24 0452) SpO2:  [99 %] 99 % (07/24 0452)  Intake/Output from previous day: 07/23 0701 - 07/24 0700 In: 0  Out: 1375 [Urine:1375]   Physical Exam: General: Well developed, well nourished, in no acute distress. Head:  Normocephalic and atraumatic. Lungs: Clear to auscultation and percussion. Heart: Normal S1 and S2.  No murmur, rubs or gallops. No JVD, tender chest wall to palpation left pectoralis lateral Abdomen: soft, non-tender, positive bowel sounds. Extremities: No clubbing or cyanosis. No edema. Neurologic: Alert and oriented x 3. Mild dysarthria noted    Lab Results:  Recent Labs  08/11/12 1340 08/12/12 0120  WBC 13.2* 15.0*  HGB 14.0 13.8  PLT 371 421*    Recent Labs  08/11/12 1339 08/13/12 0436  NA 139 139  K 4.3 4.4  CL 107 103  CO2  --  28  GLUCOSE 120* 118*  BUN 22 21  CREATININE 0.90 1.06    Recent Labs  08/11/12 1905 08/12/12 0120  TROPONINI <0.30 <0.30   Hepatic Function Panel No results found for this basename: PROT, ALBUMIN, AST, ALT, ALKPHOS, BILITOT, BILIDIR, IBILI,  in the last 72 hours No results found for this basename: CHOL,  in the last 72 hours No results found for this basename: PROTIME,  in the  last 72 hours  Imaging: Dg Chest 2 View  08/12/2012   *RADIOLOGY REPORT*  Clinical Data: Chest pain, shortness of breath, cough, smoker, right side lesion on a previous chest x-ray  CHEST - 2 VIEW  Comparison: 08/11/2012  Findings: Normal heart size, mediastinal contours, and pulmonary vascularity. Minimal peribronchial thickening. No acute infiltrate, pleural effusion or pneumothorax. Bones appear demineralized.  IMPRESSION: Minimal bronchitic changes. No acute abnormalities.   Original Report Authenticated By: Ulyses Southward, M.D.   Nm Myocar Multi W/spect W/wall Motion / Ef  08/12/2012   *RADIOLOGY REPORT*  Clinical Data:  Chest pain  MYOCARDIAL IMAGING WITH SPECT (REST AND PHARMACOLOGIC-STRESS) GATED LEFT VENTRICULAR WALL MOTION STUDY LEFT VENTRICULAR EJECTION FRACTION  Technique:  Standard myocardial SPECT imaging was performed after resting intravenous injection of 10 mCi Tc-96m sestamibi. Subsequently, intravenous infusion of lexiscan was performed under the supervision of the Cardiology staff.  At peak effect of the drug, 30 mCi Tc-59m sestamibi was injected intravenously and standard myocardial SPECT  imaging was performed.  Quantitative gated imaging was also performed to evaluate left ventricular wall motion, and estimate left ventricular ejection fraction.  Comparison:  Chest radiograph - earlier same day  Findings:  Review of the rotational raw images demonstrates mild patient motion attenuation, worse on the rest images in comparison to the stress.  SPECT imaging demonstrates mild dilatation of the left ventricular cavity.  There is a moderate to large area of decreased  radiotracer uptake involving the inferior-lateral wall of the left ventricle which appears to worsen on the provided stress images.  There is a potential area of mismatched perfusion involving the left ventricular septum, again, worse on the provided stress images.  Quantitative gated analysis shows global hypokinesia with small  geographic area of dyskinesia involving the inferior wall towards the left ventricular apex.  The resting left ventricular ejection fraction is 25% with end- diastolic volume of 99 ml and end-systolic volume of 74 ml.  IMPRESSION: 1.  Findings are worrisome for prior infarction involving the inferior lateral wall of the left ventricle with suspected minimal amount of peri infarct ischemia. 2.  Suspected additional area of ischemia involving the left ventricular septum. 3. Global hypokinesia with a geographic area of dyskinesia involving the inferior wall towards the left ventricular apex. Ejection fraction - 25%.  Above findings discussed with Dr. Josem Kaufmann at 15:21.   Original Report Authenticated By: Tacey Ruiz, MD   Dg Chest Port 1 View  08/11/2012   *RADIOLOGY REPORT*  Clinical Data: Chest pain.  PORTABLE CHEST - 1 VIEW  Comparison: No priors.  Findings: Lung volumes are normal.  No consolidative airspace disease.  No pleural effusions.  No pneumothorax.  No pulmonary nodule or mass noted.  Pulmonary vasculature and the cardiomediastinal silhouette are within normal limits.  IMPRESSION: 1. No radiographic evidence of acute cardiopulmonary disease.   Original Report Authenticated By: Trudie Reed, M.D.   Personally viewed.   Telemetry: No adverse arrhythmias Personally viewed.   EKG:  No adverse ST segment changes, inferior changes likely secondary to LVH repolarization. Slight peaking of T waves in the precordial leads with increased amplitude. LVH  Cardiac Studies:  Nuclear stress test with ejection fraction of 25%  Assessment/Plan:  52 year old male with former cocaine use here with newly discovered cardiomyopathy, EF 25% (inferior infarct with peri-infarct ischemia small amount)  on nuclear stress test with atypical chest pain, hypertension  1. Cardiomyopathy  - Pursuing cardiac catheterization. Right radial approach would not be unreasonable.  - IV heparin will be placed on hold for  catheterization  - We'll be beneficial for him to be on both ACE inhibitor as well as beta blocker (as long as cocaine use is no longer).   - Urine toxicology was negative for cocaine  2. Tobacco use  - Encourage tobacco cessation.  - Nicotinic patch currently  3. Hypertension  - Possible etiology for cardiomyopathy  - Encourage both ACE inhibitor as well as beta blocker (carvedilol/metoprolol succinate for instance in the setting of cardiomyopathy)    Isabele Lollar 08/13/2012, 8:30 AM

## 2012-08-13 NOTE — Interval H&P Note (Signed)
History and Physical Interval Note:  08/13/2012 12:29 PM  Austin Simmons  has presented today for surgery, with the diagnosis of Abnormal stress test, persistent chest pain, new onset cardiomyopathy  The various methods of treatment have been discussed with the patient and family. After consideration of risks, benefits and other options for treatment, the patient has consented to  Procedure(s): LEFT HEART CATHETERIZATION WITH CORONARY ANGIOGRAM (N/A) as a surgical intervention .  The patient's history has been reviewed, patient examined, no change in status, stable for surgery.  I have reviewed the patient's chart and labs.  Questions were answered to the patient's satisfaction.    Discussed this am with him.   [name], MARK

## 2012-08-13 NOTE — Progress Notes (Signed)
Internal Medicine Attending  Date: 08/13/2012  Patient name: Austin Simmons Medical record number: [id number] Date of birth: Jan 08, 1961 Age: 52 y.o. Gender: male  I saw and evaluated the patient. I reviewed the resident's note by Dr. Aundria Rud and I agree with the resident's findings and plans as documented in her progress note.  Austin Simmons underwent cardiac cath this afternoon and was found to have a 90% mLAD lesion and 60-70% dRCA lesion with a LVEF of 30%.  The mLAD lesion underwent PCI with a BMS with resultant TIMI III flow.  We will observe overnight and begin therapy with ASA for life, plavix for at least 1 month, ACEI/B-blockage for systolic cardiomyopathy, encourage smoking and THC cessation, and start statin therapy.  Primary Care has been established with [facility] and the first appointment has been set up for mid-August.  I anticipate he should be ready for discharge tomorrow AM if he remains stable overnight.

## 2012-08-13 NOTE — Progress Notes (Signed)
Utilization review completed.  

## 2012-08-14 DIAGNOSIS — I219 Acute myocardial infarction, unspecified: Secondary | ICD-10-CM

## 2012-08-14 DIAGNOSIS — Z9861 Coronary angioplasty status: Secondary | ICD-10-CM

## 2012-08-14 DIAGNOSIS — R0789 Other chest pain: Secondary | ICD-10-CM | POA: Diagnosis present

## 2012-08-14 LAB — CBC
MCHC: 32.4 g/dL (ref 30.0–36.0)
Platelets: 340 10*3/uL (ref 150–400)
RDW: 14 % (ref 11.5–15.5)
WBC: 11.8 10*3/uL — ABNORMAL HIGH (ref 4.0–10.5)

## 2012-08-14 LAB — BASIC METABOLIC PANEL
Chloride: 100 mEq/L (ref 96–112)
GFR calc Af Amer: >90 mL/min (ref >90)
GFR calc non Af Amer: 82 mL/min — ABNORMAL LOW (ref >90)
Potassium: 4.7 mEq/L (ref 3.5–5.1)
Sodium: 137 mEq/L (ref 135–145)

## 2012-08-14 LAB — TROPONIN I: Troponin I: 1.97 ng/mL (ref <0.30)

## 2012-08-14 MED ORDER — CLOPIDOGREL BISULFATE 75 MG PO TABS: 75.0000 mg | ORAL_TABLET | Freq: Every day | ORAL | Status: DC

## 2012-08-14 MED ORDER — IBUPROFEN 800 MG PO TABS
800.0000 mg | ORAL_TABLET | Freq: Three times a day (TID) | ORAL | Status: DC | PRN
  Filled 2012-08-14: qty 1

## 2012-08-14 MED ORDER — MORPHINE SULFATE 2 MG/ML IJ SOLN: 2.0000 mg | Freq: Once | INTRAMUSCULAR | Status: AC

## 2012-08-14 MED ORDER — METOPROLOL TARTRATE 100 MG PO TABS: 100.0000 mg | ORAL_TABLET | Freq: Two times a day (BID) | ORAL | Status: DC

## 2012-08-14 MED ORDER — ATORVASTATIN CALCIUM 80 MG PO TABS: 80.0000 mg | ORAL_TABLET | Freq: Every day | ORAL | Status: DC

## 2012-08-14 MED ORDER — MORPHINE SULFATE 2 MG/ML IJ SOLN
2.0000 mg | Freq: Once | INTRAMUSCULAR | Status: AC
  Administered 2012-08-14: 2 mg via INTRAVENOUS
  Filled 2012-08-14: qty 1

## 2012-08-14 MED ORDER — MORPHINE SULFATE 2 MG/ML IJ SOLN
INTRAMUSCULAR | Status: AC
  Administered 2012-08-14: 05:00:00 2 mg via INTRAVENOUS
  Filled 2012-08-14: qty 1

## 2012-08-14 NOTE — Progress Notes (Signed)
Internal Medicine Attending  Date: 08/14/2012  Patient name: Austin Simmons Medical record number: [id number] Date of birth: [birth year] Age: 52 y.o. Gender: male  I saw and evaluated the patient. I reviewed the resident's note by Dr. Aundria Rud and I agree with the resident's findings and plans as documented in her progress note.  [name] continues to have 10/10 pain but appears comfortable during these episodes.  Likely related to musculoskeletal pain or anxiety given his response to anxiolytics.  He is stable for discharge on lifelong ASA, 1 month plavix, benzos for his anxiety, and NSAIDs for his chest wall musculoskeletal pain.

## 2012-08-14 NOTE — Progress Notes (Signed)
CARDIAC REHAB PHASE I   PRE:  Rate/Rhythm: 78 SR    BP: sitting 155/98    SaO2: 100 4L, 98 RA  MODE:  Ambulation: 600 ft   POST:  Rate/Rhythm: 79 SR    BP: sitting 168 /124, 148/108 after 30 min    SaO2: 99 RA   Pt sts he is having chest pain with movement and breathing. Able to walk without problems except BP very high after walk, 168/124. To recliner. Ed completed and pt sts he is interested in [facility] in [place]. Sts he wants to use patches to quit smoking. Pt sts he watches diet and exercises daily 1 hr on TM. Voices understanding of ed.  [id number]  Harriet Masson CES, ACSM 08/14/2012 9:20 AM

## 2012-08-14 NOTE — Progress Notes (Signed)
Chest pain intensified to 10/10 @ 0500. Reported radiation down left arm with numbness & tingling of left hand.  Dr Terressa Koyanagi notified.  SL NTG given for relief.

## 2012-08-14 NOTE — Plan of Care (Signed)
Cardiology Fellow Note  S: Called to evaluate chest pain early this am.  He is s/p PCI of LAD after abnormal stress test. Patient had chest pain earlier and was given Vicodin.  Pain resolved and patient went to sleep. Patient now awake with return of chest pain and anxiety.  He states pain is 10/10 but patient very comfortable during examination.  He remains hemodynamically stable.   EKG sinus rhythm peaked T waves in anterior leads, no significant acute changes since last post PCI ekg reviewed. No change in ecg after reported improvement in pain with NTG  A/P - Will continue close monitoring. - At this time, no indication for invasive re-evaluation - Will given prn Morphine for pain - check troponin with am labs - Reassured patient

## 2012-08-14 NOTE — Progress Notes (Signed)
Subjective:  Once again, continue to have ongoing chest discomfort, requesting morphine. Toradol was tried yesterday evening. Successful stent placement to mid LAD. EKGs unchanged. Troponin elevated post-PCI, not unexpected.  Objective:  Vital Signs in the last 24 hours: Temp:  [97.7 F (36.5 C)-98.8 F (37.1 C)] 98.1 F (36.7 C) (07/25 0713) Pulse Rate:  [69-83] 74 (07/25 0713) Resp:  [12-32] 15 (07/25 0600) BP: (110-192)/(77-138) 116/85 mmHg (07/25 0713) SpO2:  [93 %-100 %] 100 % (07/25 0713) Weight:  [86 kg (189 lb 9.5 oz)] 86 kg (189 lb 9.5 oz) (07/25 0500)  Intake/Output from previous day: 07/24 0701 - 07/25 0700 In: 1192.5 [P.O.:780; I.V.:412.5] Out: 1175 [Urine:1175]   Physical Exam: General: Well developed, well nourished, in no acute distress. (Although just before he stated that he was having 10 over 10 chest pain) Head:  Normocephalic and atraumatic. Lungs: Clear to auscultation and percussion. Heart: Normal S1 and S2.  No murmur, rubs or gallops.  Abdomen: soft, non-tender, positive bowel sounds. Extremities: No clubbing or cyanosis. No edema. Radial catheterization site clean dry and intact Neurologic: Alert and oriented x 3.    Lab Results:  Recent Labs  08/12/12 0120 08/14/12 0650  WBC 15.0* 11.8*  HGB 13.8 12.5*  PLT 421* 340    Recent Labs  08/13/12 0436 08/14/12 0650  NA 139 137  K 4.4 4.7  CL 103 100  CO2 28 29  GLUCOSE 118* 106*  BUN 21 19  CREATININE 1.06 1.05    Recent Labs  08/12/12 0120 08/14/12 0650  TROPONINI <0.30 1.97*   Hepatic Function Panel No results found for this basename: PROT, ALBUMIN, AST, ALT, ALKPHOS, BILITOT, BILIDIR, IBILI,  in the last 72 hours No results found for this basename: CHOL,  in the last 72 hours No results found for this basename: PROTIME,  in the last 72 hours  Imaging: Dg Chest 2 View  08/12/2012   *RADIOLOGY REPORT*  Clinical Data: Chest pain, shortness of breath, cough, smoker, right side  lesion on a previous chest x-ray  CHEST - 2 VIEW  Comparison: 08/11/2012  Findings: Normal heart size, mediastinal contours, and pulmonary vascularity. Minimal peribronchial thickening. No acute infiltrate, pleural effusion or pneumothorax. Bones appear demineralized.  IMPRESSION: Minimal bronchitic changes. No acute abnormalities.   Original Report Authenticated By: Ulyses Southward, M.D.   Nm Myocar Multi W/spect W/wall Motion / Ef  08/12/2012   *RADIOLOGY REPORT*  Clinical Data:  Chest pain  MYOCARDIAL IMAGING WITH SPECT (REST AND PHARMACOLOGIC-STRESS) GATED LEFT VENTRICULAR WALL MOTION STUDY LEFT VENTRICULAR EJECTION FRACTION  Technique:  Standard myocardial SPECT imaging was performed after resting intravenous injection of 10 mCi Tc-17m sestamibi. Subsequently, intravenous infusion of lexiscan was performed under the supervision of the Cardiology staff.  At peak effect of the drug, 30 mCi Tc-5m sestamibi was injected intravenously and standard myocardial SPECT  imaging was performed.  Quantitative gated imaging was also performed to evaluate left ventricular wall motion, and estimate left ventricular ejection fraction.  Comparison:  Chest radiograph - earlier same day  Findings:  Review of the rotational raw images demonstrates mild patient motion attenuation, worse on the rest images in comparison to the stress.  SPECT imaging demonstrates mild dilatation of the left ventricular cavity.  There is a moderate to large area of decreased radiotracer uptake involving the inferior-lateral wall of the left ventricle which appears to worsen on the provided stress images.  There is a potential area of mismatched perfusion involving the left ventricular septum, again,  worse on the provided stress images.  Quantitative gated analysis shows global hypokinesia with small geographic area of dyskinesia involving the inferior wall towards the left ventricular apex.  The resting left ventricular ejection fraction is 25% with  end- diastolic volume of 99 ml and end-systolic volume of 74 ml.  IMPRESSION: 1.  Findings are worrisome for prior infarction involving the inferior lateral wall of the left ventricle with suspected minimal amount of peri infarct ischemia. 2.  Suspected additional area of ischemia involving the left ventricular septum. 3. Global hypokinesia with a geographic area of dyskinesia involving the inferior wall towards the left ventricular apex. Ejection fraction - 25%.  Above findings discussed with Dr. Josem Kaufmann at 15:21.   Original Report Authenticated By: Tacey Ruiz, MD   Personally viewed.   Telemetry: No adverse arrhythmias Personally viewed.   EKG:  Sinus rhythm, slightly high amplitude T waves in anterior leads but no significant changes post PCI to LAD.  Cardiac Studies:  Cardiac catheterization with 90% stenosis to mid LAD with focal bare-metal stent placed to this region. Residual 60% stenosis of distal RCA. EF 35%.  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  1. CAD-successful stent placement to LAD. Plavix is mandatory for at least one month optimal 1 year after bare-metal stent placement. Continue with aspirin lifelong. Very good result. RCA lesion was not felt to be hemodynamic significant. We'll continue to manage medically at this point. I spoke to his wife yesterday regarding his procedure. Troponin is elevated which is not uncommon post PCI procedure.   2. Chest pain-he continues to have chest pain as he has been having throughout this entire admission. It appears that the stent placement did not relieve any of his chest discomfort. My fear is that his chest discomfort, being atypical, is possibly inflammatory or  musculoskeletal/pleuritic. Is very hard clinically to understand where his pain is coming from. He seems to respond to anti-anxiety medicine. One could consider evaluation to exclude pulmonary embolism with CT. I will leave this up to primary team to decide. He has been evaluated multiple times during episodes of 10 over 10 chest pain and appears to be very comfortable during examination.  3. Tobacco cessation  4. Continue with statin  5. Ischemic cardiomyopathy-agree with ARB/beta blocker.  He does have followup in clinic.  Siria Calandro 08/14/2012, 8:23 AM

## 2012-08-14 NOTE — Progress Notes (Signed)
Subjective: Austin Simmons is doing fine this morning, states he is in 10/10 chest pain but is up and about, very chatty, and is dressed and stating he is ready to go home.   Early this morning, pt was complaining of 10/10 chest pain on multiple occasions, cardiologist evaluated him, performed EKGs both during pain and once it had slightly decreased with morphine, no EKG changes from baseline either time. Troponin elevated to 1.9, but cardiology states this is not uncommon 1 day post-catheterization.   Objective: Vital signs in last 24 hours: Filed Vitals:   08/14/12 0530 08/14/12 0540 08/14/12 0600 08/14/12 0713  BP: 110/78   116/85  Pulse:    74  Temp:    98.1 F (36.7 C)  TempSrc:    Oral  Resp: 12 16 15    Height:      Weight:      SpO2: 97% 98% 98% 100%   Weight change:   Intake/Output Summary (Last 24 hours) at 08/14/12 1200 Last data filed at 08/14/12 0719  Gross per 24 hour  Intake 1192.5 ml  Output   1475 ml  Net -282.5 ml   PEX General: alert, cooperative, and in no apparent distress HEENT: vision grossly intact, oropharynx clear and non-erythematous  Neck: supple, no lymphadenopathy, JVD, or carotid bruits Lungs: clear to ascultation bilaterally, normal work of respiration, no wheezes, rales, ronchi Heart: regular rate and rhythm, no murmurs, gallops, or rubs Abdomen: soft, non-tender, non-distended, normal bowel sounds Extremities: no cyanosis, clubbing, or edema; right wrist with bandaid covering catheterization access site, no bleeding Neurologic: alert & oriented X3, cranial nerves II-XII intact, strength grossly intact, sensation intact to light touch  Lab Results: Basic Metabolic Panel:  Recent Labs Lab 08/13/12 0436 08/14/12 0650  NA 139 137  K 4.4 4.7  CL 103 100  CO2 28 29  GLUCOSE 118* 106*  BUN 21 19  CREATININE 1.06 1.05  CALCIUM 9.1 9.3   CBC:  Recent Labs Lab 08/11/12 1340 08/12/12 0120 08/14/12 0650  WBC 13.2* 15.0* 11.8*    NEUTROABS 9.0*  --   --   HGB 14.0 13.8 12.5*  HCT 44.1 43.5 38.6*  MCV 87.0 88.1 87.3  PLT 371 421* 340   Cardiac Enzymes:  Recent Labs Lab 08/11/12 1905 08/12/12 0120 08/14/12 0650  TROPONINI <0.30 <0.30 1.97*   Urine Drug Screen: Drugs of Abuse     Component Value Date/Time   LABOPIA POSITIVE* 08/11/2012 1330   COCAINSCRNUR NONE DETECTED 08/11/2012 1330   LABBENZ NONE DETECTED 08/11/2012 1330   AMPHETMU NONE DETECTED 08/11/2012 1330   THCU POSITIVE* 08/11/2012 1330   LABBARB NONE DETECTED 08/11/2012 1330     Micro Results: Recent Results (from the past 240 hour(s))  MRSA PCR SCREENING     Status: None   Collection Time    08/11/12  8:08 PM      Result Value Range Status   MRSA by PCR NEGATIVE  NEGATIVE Final   Comment:            The GeneXpert MRSA Assay (FDA     approved for NASAL specimens     only), is one component of a     comprehensive MRSA colonization     surveillance program. It is not     intended to diagnose MRSA     infection nor to guide or     monitor treatment for     MRSA infections.   Studies/Results: Cath report- IMPRESSIONS:  1.  Successful PCI of left anterior descending artery with a 4.0 x 12 Veriflex stent, postdilated to 4.1 mm. 2. Vasospasm of the right radial artery treated with IA NTG and Verapamil. RECOMMENDATION: Aspirin and Plavix for at least a month. Aggressive risk factor modification. F/u with Dr. Anne Fu.  Dg Chest 2 View  08/12/2012   *RADIOLOGY REPORT*  Clinical Data: Chest pain, shortness of breath, cough, smoker, right side lesion on a previous chest x-ray  CHEST - 2 VIEW  Comparison: 08/11/2012  Findings: Normal heart size, mediastinal contours, and pulmonary vascularity. Minimal peribronchial thickening. No acute infiltrate, pleural effusion or pneumothorax. Bones appear demineralized.  IMPRESSION: Minimal bronchitic changes. No acute abnormalities.   Original Report Authenticated By: Ulyses Southward, M.D.   Nm Myocar Multi  W/spect W/wall Motion / Ef  08/12/2012   *RADIOLOGY REPORT*  Clinical Data:  Chest pain  MYOCARDIAL IMAGING WITH SPECT (REST AND PHARMACOLOGIC-STRESS) GATED LEFT VENTRICULAR WALL MOTION STUDY LEFT VENTRICULAR EJECTION FRACTION  Technique:  Standard myocardial SPECT imaging was performed after resting intravenous injection of 10 mCi Tc-22m sestamibi. Subsequently, intravenous infusion of lexiscan was performed under the supervision of the Cardiology staff.  At peak effect of the drug, 30 mCi Tc-6m sestamibi was injected intravenously and standard myocardial SPECT  imaging was performed.  Quantitative gated imaging was also performed to evaluate left ventricular wall motion, and estimate left ventricular ejection fraction.  Comparison:  Chest radiograph - earlier same day  Findings:  Review of the rotational raw images demonstrates mild patient motion attenuation, worse on the rest images in comparison to the stress.  SPECT imaging demonstrates mild dilatation of the left ventricular cavity.  There is a moderate to large area of decreased radiotracer uptake involving the inferior-lateral wall of the left ventricle which appears to worsen on the provided stress images.  There is a potential area of mismatched perfusion involving the left ventricular septum, again, worse on the provided stress images.  Quantitative gated analysis shows global hypokinesia with small geographic area of dyskinesia involving the inferior wall towards the left ventricular apex.  The resting left ventricular ejection fraction is 25% with end- diastolic volume of 99 ml and end-systolic volume of 74 ml.  IMPRESSION: 1.  Findings are worrisome for prior infarction involving the inferior lateral wall of the left ventricle with suspected minimal amount of peri infarct ischemia. 2.  Suspected additional area of ischemia involving the left ventricular septum. 3. Global hypokinesia with a geographic area of dyskinesia involving the inferior wall  towards the left ventricular apex. Ejection fraction - 25%.  Above findings discussed with Dr. Josem Kaufmann at 15:21.   Original Report Authenticated By: Tacey Ruiz, MD   Medications: I have reviewed the patient's current medications. Scheduled Meds: . aspirin  81 mg Oral Daily  . atorvastatin  80 mg Oral q1800  . cloNIDine  0.1 mg Oral BID  . clopidogrel  75 mg Oral Q breakfast  . hydrochlorothiazide  25 mg Oral Daily  . losartan  100 mg Oral Daily  . metoprolol tartrate  50 mg Oral BID  . nicotine  21 mg Transdermal Daily  . 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     #Atypical chest pain- Thought unlikely to be ACS given different pain from prior NSTEMI/crampy in nature, constant for last 2 weeks, actually improved with activity, only mildly relieved with nitroglycerin. TIMI score of 2 (HTN, ASA) so low probability for NSTEMI.  Also on differential are GERD (pain worsened by acidic foods) vs. MSK pain (given crampy nature of pain, 2 week duration). Now thought to be inflammatory or musculoskeletal. Pt had cardiac catheterization on 7/24 s/p BMS to LAD.  Dr. Anne Fu mentioned possible evaluation for PE in his note today.  However, given pt's revised Geneva score of 0, we feel comfortable not pursuing this.  Deboraha Sprang cardiology consult, appreciate recs -continue Plavix, pt educated on necessity of taking this medication for at least one month -continue metoprolol 50 mg PO BID; will discharge pt on metoprolol 100 mg PO BID given pt's HR of 74 this morning (not adequately beta blocked; goal HR 55-65) -continue atorvastatin,  ASA 81ng, nitro prn  -restart losartan/hctz home meds  #HTN- Pt's systolic BPs 130s-160s this admission. -continue clonidine -restart losartan/hctz per above; pt also now on metoprolol   #Nicotine abuse- Pt interested in smoking cessation.  Will provide education. -continue nicotine patch   Dispo: Disposition today.  The patient does not have a current PCP (Provider Default, MD) and does need an [facility] hospital follow-up appointment after discharge. Dr. Anne Fu on 8/6 at 11a.  [facility] on [address] Wed 8/13 at 1:30p.  Marland KitchenServices Needed at time of discharge: Y = Yes, Blank = No PT:   OT:   RN:   Equipment:   Other:     LOS: 3 days   Rocco Serene, MD 08/14/2012, 12:00 PM

## 2012-08-14 NOTE — Progress Notes (Signed)
Patient c/o left sided "grabbing" chest pain @ 0410.  Rated 9/10.  Stat EKG done. One SL NTG given with no change in discomfort level.  Dr Terressa Koyanagi notified.  Second SL NTG given at 0420; VSS.  Pain intensified to 10/10 @0430 .  Dr Terressa Koyanagi notified again and orders received. Another EKG performed with no change noted.  Morphine 2 mg SIVP given with chest pain gradually subsiding to 7/10 by 0435.  Dr Terressa Koyanagi on floor to see patient.  Will continue to closely monitor.

## 2012-08-14 NOTE — Discharge Summary (Signed)
Name: Austin Simmons MRN: [id number] DOB: [birth year] 52 y.o. PCP: Provider Default, MD  Date of Admission: 08/11/2012 12:23 PM Date of Discharge: 08/14/2012 Attending Physician: Josem Kaufmann  Discharge Diagnosis: 1. Abnormal cardiovascular stress test- Lexiscan myoview results below; s/p cardiac cath on 7/24 with placement of BMS (Veriflex) to LAD; pt needs lifelong ASA, Plavix x 1 month; pt also discharged on metoprolol, atorvastatin with continuation of his losartan, HCTZ 2. Atypical chest pain- thought to be musculoskeletal in nature, ibuprofen 800 mg q8h prn 3. HTN- pt d/c'ed on metoprolol, losartan, HCTZ, clonidine 4. Tobacco abuse- pt interested in quitting, received cessation counseling   Discharge Medications:   Medication List         aspirin 81 MG chewable tablet  Chew 81 mg by mouth daily.     atorvastatin 80 MG tablet  Commonly known as:  LIPITOR  Take 1 tablet (80 mg total) by mouth daily at 6 PM.     cloNIDine 0.1 MG tablet  Commonly known as:  CATAPRES  Take 0.1 mg by mouth 2 (two) times daily.     clopidogrel 75 MG tablet  Commonly known as:  PLAVIX  Take 1 tablet (75 mg total) by mouth daily with breakfast.     diazepam 5 MG tablet  Commonly known as:  VALIUM  Take 5 mg by mouth every 12 (twelve) hours as needed. For anxiety     HYDROcodone-acetaminophen 10-325 MG per tablet  Commonly known as:  NORCO  Take 1 tablet by mouth every 8 (eight) hours as needed for pain.     losartan-hydrochlorothiazide 100-25 MG per tablet  Commonly known as:  HYZAAR  Take 1 tablet by mouth daily.     metoprolol 100 MG tablet  Commonly known as:  LOPRESSOR  Take 1 tablet (100 mg total) by mouth 2 (two) times daily.     nitroGLYCERIN 0.4 MG SL tablet  Commonly known as:  NITROSTAT  - Place 0.4 mg under the tongue every 5 (five) minutes as needed. Chest pain  -         Disposition and follow-up:   Austin Simmons was discharged from [facility] in  Stable condition.  At the hospital follow up visit please address:  1.  Medication compliance, especially Plavix  2. Recurrent chest pain  3.  Labs / imaging needed at time of follow-up: none   4.  Pending labs/ test needing follow-up: none  Follow-up Appointments: Follow-up Information   Follow up with [name], MD On 08/26/2012. (11am)    [address] [place] [address] [phone]       Follow up with [email or web address] On 09/02/2012. (1:30p)    Contact information:   [address] [place] [address] [phone]      Discharge Instructions: Discharge Orders   Future Orders Complete By Expires     Amb Referral to Cardiac Rehabilitation  As directed     Call MD for:  severe uncontrolled pain  As directed     Diet - low sodium heart healthy  As directed     Increase activity slowly  As directed        Consultations: Treatment Team:  [name], MD  Procedures Performed:  Dg Chest 2 View  08/12/2012   *RADIOLOGY REPORT*  Clinical Data: Chest pain, shortness of breath, cough, smoker, right side lesion on a previous chest x-ray  CHEST - 2 VIEW  Comparison:  08/11/2012  Findings: Normal heart size, mediastinal contours, and pulmonary vascularity. Minimal peribronchial thickening. No acute infiltrate, pleural effusion or pneumothorax. Bones appear demineralized.  IMPRESSION: Minimal bronchitic changes. No acute abnormalities.   Original Report Authenticated By: Ulyses Southward, M.D.   Dg Shoulder Right  08/05/2012   *RADIOLOGY REPORT*  Clinical Data: Shoulder pain  RIGHT SHOULDER - 2+ VIEW  Comparison: 04/25/2011  Findings: Three views of the right shoulder submitted.  No acute fracture or subluxation.  Glenohumeral joint is preserved.  AC joint is unremarkable.  IMPRESSION: No acute fracture or subluxation.  No significant change.   Original Report Authenticated By: Natasha Mead, M.D.   Nm Myocar Multi  W/spect W/wall Motion / Ef  08/12/2012   *RADIOLOGY REPORT*  Clinical Data:  Chest pain  MYOCARDIAL IMAGING WITH SPECT (REST AND PHARMACOLOGIC-STRESS) GATED LEFT VENTRICULAR WALL MOTION STUDY LEFT VENTRICULAR EJECTION FRACTION  Technique:  Standard myocardial SPECT imaging was performed after resting intravenous injection of 10 mCi Tc-42m sestamibi. Subsequently, intravenous infusion of lexiscan was performed under the supervision of the Cardiology staff.  At peak effect of the drug, 30 mCi Tc-61m sestamibi was injected intravenously and standard myocardial SPECT  imaging was performed.  Quantitative gated imaging was also performed to evaluate left ventricular wall motion, and estimate left ventricular ejection fraction.  Comparison:  Chest radiograph - earlier same day  Findings:  Review of the rotational raw images demonstrates mild patient motion attenuation, worse on the rest images in comparison to the stress.  SPECT imaging demonstrates mild dilatation of the left ventricular cavity.  There is a moderate to large area of decreased radiotracer uptake involving the inferior-lateral wall of the left ventricle which appears to worsen on the provided stress images.  There is a potential area of mismatched perfusion involving the left ventricular septum, again, worse on the provided stress images.  Quantitative gated analysis shows global hypokinesia with small geographic area of dyskinesia involving the inferior wall towards the left ventricular apex.  The resting left ventricular ejection fraction is 25% with end- diastolic volume of 99 ml and end-systolic volume of 74 ml.  IMPRESSION: 1.  Findings are worrisome for prior infarction involving the inferior lateral wall of the left ventricle with suspected minimal amount of peri infarct ischemia. 2.  Suspected additional area of ischemia involving the left ventricular septum. 3. Global hypokinesia with a geographic area of dyskinesia involving the inferior wall  towards the left ventricular apex. Ejection fraction - 25%.  Above findings discussed with Dr. Josem Kaufmann at 15:21.   Original Report Authenticated By: Tacey Ruiz, MD   Dg Chest Port 1 View  08/11/2012   *RADIOLOGY REPORT*  Clinical Data: Chest pain.  PORTABLE CHEST - 1 VIEW  Comparison: No priors.  Findings: Lung volumes are normal.  No consolidative airspace disease.  No pleural effusions.  No pneumothorax.  No pulmonary nodule or mass noted.  Pulmonary vasculature and the cardiomediastinal silhouette are within normal limits.  IMPRESSION: 1. No radiographic evidence of acute cardiopulmonary disease.   Original Report Authenticated By: Trudie Reed, M.D.   Dg Shoulder Left  08/05/2012   *RADIOLOGY REPORT*  Clinical Data: Bilateral shoulder pain  LEFT SHOULDER - 2+ VIEW  Comparison: None.  Findings: Three views of the left shoulder submitted.  No acute fracture or subluxation.  No radiopaque foreign body.  Glenohumeral joint is preserved.  IMPRESSION: No acute fracture or subluxation.   Original Report Authenticated By: Natasha Mead, M.D.   Cardiac Cath:  IMPRESSIONS:  1. Successful PCI of left anterior descending artery with a 4.0 x 12 Veriflex stent, postdilated to 4.1 mm. 2. Vasospasm of the right radial artery treated with IA NTG and Verapamil. RECOMMENDATION: Aspirin and Plavix for at least a month. Aggressive risk factor modification. F/u with Dr. Anne Fu.   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 Pt states that the chest pain has been going on for approximately 2 weeks and is a crampy pressure-like pain that radiates to his left arm with associated numbness in the arm. Pain began at rest when he was lying in bed, associated with diaphoresis. Pt states that pain is mildly relieved for an hour or two when he takes four  nitroglycerin tablets. Pt states that this pain is different from and more painful than his previous NSTEMI. No trauma or injury. Pain is better with activity, and pt states he sometimes gets up to "walk off the pain." Pt states that pain is worse after eating acidic foods like raw tomatoes. Pain is also worse with deep inspiration.  In ED, troponin x 1 negative, UDS negative.  Of note, pt has a significant first degree family history of CAD with his father passing away from an MI when pt was a young child.    Hospital Course by problem list: 1. Atypical chest pain with abnormal stress test- Pt's pain thought unlikely to be ACS given different pain from prior NSTEMI/crampy in nature, constant for last 2 weeks, non-exertional and even improved with activity, only mildly relieved with nitroglycerin. TIMI score of 2 (HTN, ASA) so low probability for NSTEMI.  However, given pt's cardiac history of NSTEMI, family history of father who passed away from MI in 30s/40s, and risk factors including tobacco abuse and HTN, concern for ACS thus a stress test was ordered.  Lexiscan myoview on 7/23 was abnormal, showed prior infarction of inferior lateral wall of left ventricle with minimal peri infarct ischemia, suspected additional area of ischemia involving the left ventricular septum, and  global hypokinesia with a geographic area of dyskinesia involving the inferior wall towards the left ventricular apex; ejection fraction of 25%. [facility] was consulted to assess pt for catheterization, decided to proceed given the significant drop in EF from 60-65% on echo in 12/12.  Heparin drip was started after abnormal stress test but discontinued prior to catheterization.  Pt went to cath lab on 7/24, now s/p BMS to LAD.  Pt continued to have 10/10 pain post-cath but appeared comfortable. Thought by our team and cardiologists to be related to musculoskeletal pain or anxiety given his response to anxiolytics.  Pt needs  lifelong ASA, Plavix x 1 month; pt also discharged on metoprolol 100 mg PO BID, atorvastatin with continuation of his losartan, HCTZ.   2. HTN- Pt's systolic BPs 130s-160s this admission. Continued clonidine, held losartan due to possible contrast load with cath, held HCTZ for variable PO intake in setting of multiple cardiac procedures. Both of these medications were restarted on 7/24.  3. Tobacco abuse- Pt interested in smoking cessation, was counseled and given nicotine patch while inpatient.      Discharge Vitals:   BP 116/85  Pulse 74  Temp(Src) 98.1 F (36.7 C) (Oral)  Resp 15  Ht 5\' 7"  (1.702 m)  Wt 189 lb 9.5 oz (86 kg)  BMI 29.69  kg/m2  SpO2 100%  Discharge Labs:  No results found for this or any previous visit (from the past 24 hour(s)).  Signed: Rocco Serene, MD 08/16/2012, 8:00 PM   Time Spent on Discharge: 40 minutes Services Ordered on Discharge: none Equipment Ordered on Discharge: none

## 2012-09-13 ENCOUNTER — Emergency Department (HOSPITAL_COMMUNITY)
Admission: EM | Admit: 2012-09-13 | Discharge: 2012-09-13 | Disposition: A | Discharge status: Home / Self Care | Payer: PRIVATE HEALTH INSURANCE | Attending: Emergency Medicine | Admitting: Emergency Medicine

## 2012-09-13 ENCOUNTER — Encounter (HOSPITAL_COMMUNITY): Payer: Self-pay | Admitting: *Deleted

## 2012-09-13 ENCOUNTER — Emergency Department (HOSPITAL_COMMUNITY): Payer: PRIVATE HEALTH INSURANCE

## 2012-09-13 DIAGNOSIS — I2510 Atherosclerotic heart disease of native coronary artery without angina pectoris: Secondary | ICD-10-CM | POA: Insufficient documentation

## 2012-09-13 DIAGNOSIS — F17200 Nicotine dependence, unspecified, uncomplicated: Secondary | ICD-10-CM | POA: Insufficient documentation

## 2012-09-13 DIAGNOSIS — Z880 Allergy status to penicillin: Secondary | ICD-10-CM | POA: Insufficient documentation

## 2012-09-13 DIAGNOSIS — G8929 Other chronic pain: Secondary | ICD-10-CM | POA: Insufficient documentation

## 2012-09-13 DIAGNOSIS — I252 Old myocardial infarction: Secondary | ICD-10-CM | POA: Insufficient documentation

## 2012-09-13 DIAGNOSIS — M129 Arthropathy, unspecified: Secondary | ICD-10-CM | POA: Insufficient documentation

## 2012-09-13 DIAGNOSIS — Z79899 Other long term (current) drug therapy: Secondary | ICD-10-CM | POA: Insufficient documentation

## 2012-09-13 DIAGNOSIS — I10 Essential (primary) hypertension: Secondary | ICD-10-CM | POA: Insufficient documentation

## 2012-09-13 DIAGNOSIS — R4789 Other speech disturbances: Secondary | ICD-10-CM | POA: Insufficient documentation

## 2012-09-13 DIAGNOSIS — IMO0001 Reserved for inherently not codable concepts without codable children: Secondary | ICD-10-CM | POA: Insufficient documentation

## 2012-09-13 DIAGNOSIS — F411 Generalized anxiety disorder: Secondary | ICD-10-CM | POA: Insufficient documentation

## 2012-09-13 DIAGNOSIS — M25519 Pain in unspecified shoulder: Secondary | ICD-10-CM | POA: Insufficient documentation

## 2012-09-13 DIAGNOSIS — Z7982 Long term (current) use of aspirin: Secondary | ICD-10-CM | POA: Insufficient documentation

## 2012-09-13 DIAGNOSIS — Z7902 Long term (current) use of antithrombotics/antiplatelets: Secondary | ICD-10-CM | POA: Insufficient documentation

## 2012-09-13 DIAGNOSIS — Z9889 Other specified postprocedural states: Secondary | ICD-10-CM | POA: Insufficient documentation

## 2012-09-13 DIAGNOSIS — G43909 Migraine, unspecified, not intractable, without status migrainosus: Secondary | ICD-10-CM | POA: Insufficient documentation

## 2012-09-13 DIAGNOSIS — Z8673 Personal history of transient ischemic attack (TIA), and cerebral infarction without residual deficits: Secondary | ICD-10-CM | POA: Insufficient documentation

## 2012-09-13 DIAGNOSIS — Z8719 Personal history of other diseases of the digestive system: Secondary | ICD-10-CM | POA: Insufficient documentation

## 2012-09-13 DIAGNOSIS — Z8679 Personal history of other diseases of the circulatory system: Secondary | ICD-10-CM | POA: Insufficient documentation

## 2012-09-13 DIAGNOSIS — Z8781 Personal history of (healed) traumatic fracture: Secondary | ICD-10-CM | POA: Insufficient documentation

## 2012-09-13 DIAGNOSIS — Z9861 Coronary angioplasty status: Secondary | ICD-10-CM | POA: Insufficient documentation

## 2012-09-13 HISTORY — DX: Atherosclerotic heart disease of native coronary artery without angina pectoris: I25.10

## 2012-09-13 LAB — BASIC METABOLIC PANEL
Calcium: 9.3 mg/dL (ref 8.4–10.5)
GFR calc Af Amer: >90 mL/min (ref >90)
GFR calc non Af Amer: >90 mL/min (ref >90)
Potassium: 4.2 mEq/L (ref 3.5–5.1)
Sodium: 136 mEq/L (ref 135–145)

## 2012-09-13 LAB — CBC
MCH: 28.9 pg (ref 26.0–34.0)
MCHC: 32.8 g/dL (ref 30.0–36.0)
Platelets: 309 10*3/uL (ref 150–400)
RDW: 14.4 % (ref 11.5–15.5)

## 2012-09-13 LAB — POCT I-STAT TROPONIN I: Troponin i, poc: 0 ng/mL (ref 0.00–0.08)

## 2012-09-13 MED ORDER — HYDROCODONE-ACETAMINOPHEN 5-325 MG PO TABS: ORAL_TABLET | ORAL | Status: DC

## 2012-09-13 MED ORDER — HYDROCODONE-ACETAMINOPHEN 5-325 MG PO TABS
1.0000 | ORAL_TABLET | Freq: Once | ORAL | Status: AC
  Administered 2012-09-13: 1 via ORAL
  Filled 2012-09-13: qty 1
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
## 2012-09-15 NOTE — ED Provider Notes (Signed)
Medical screening examination/treatment/procedure(s) were performed by non-physician practitioner and as supervising physician I was immediately available for consultation/collaboration.  Duffy Dantonio T Sophiea Ueda, MD 09/15/12 1125 

## 2012-09-29 ENCOUNTER — Telehealth (HOSPITAL_COMMUNITY): Payer: Self-pay | Admitting: *Deleted

## 2012-10-01 ENCOUNTER — Ambulatory Visit (HOSPITAL_COMMUNITY): Payer: PRIVATE HEALTH INSURANCE

## 2012-10-05 ENCOUNTER — Other Ambulatory Visit (HOSPITAL_COMMUNITY): Payer: Self-pay | Admitting: Internal Medicine

## 2012-10-05 ENCOUNTER — Ambulatory Visit (HOSPITAL_COMMUNITY): Payer: PRIVATE HEALTH INSURANCE

## 2012-10-07 ENCOUNTER — Ambulatory Visit (HOSPITAL_COMMUNITY): Payer: PRIVATE HEALTH INSURANCE

## 2012-10-09 ENCOUNTER — Ambulatory Visit (HOSPITAL_COMMUNITY): Payer: PRIVATE HEALTH INSURANCE

## 2012-10-12 ENCOUNTER — Ambulatory Visit (HOSPITAL_COMMUNITY): Payer: PRIVATE HEALTH INSURANCE

## 2012-10-14 ENCOUNTER — Ambulatory Visit (HOSPITAL_COMMUNITY): Payer: PRIVATE HEALTH INSURANCE

## 2012-10-16 ENCOUNTER — Ambulatory Visit (HOSPITAL_COMMUNITY): Payer: PRIVATE HEALTH INSURANCE

## 2012-10-19 ENCOUNTER — Ambulatory Visit (HOSPITAL_COMMUNITY): Payer: PRIVATE HEALTH INSURANCE

## 2012-10-21 ENCOUNTER — Ambulatory Visit (HOSPITAL_COMMUNITY): Payer: PRIVATE HEALTH INSURANCE

## 2012-10-23 ENCOUNTER — Ambulatory Visit (HOSPITAL_COMMUNITY): Payer: PRIVATE HEALTH INSURANCE

## 2012-10-26 ENCOUNTER — Ambulatory Visit (HOSPITAL_COMMUNITY): Payer: PRIVATE HEALTH INSURANCE

## 2012-10-28 ENCOUNTER — Emergency Department (HOSPITAL_COMMUNITY): Payer: PRIVATE HEALTH INSURANCE

## 2012-10-28 ENCOUNTER — Encounter (HOSPITAL_COMMUNITY): Payer: Self-pay | Admitting: Emergency Medicine

## 2012-10-28 ENCOUNTER — Ambulatory Visit (HOSPITAL_COMMUNITY): Payer: PRIVATE HEALTH INSURANCE

## 2012-10-28 ENCOUNTER — Emergency Department (HOSPITAL_COMMUNITY)
Admission: EM | Admit: 2012-10-28 | Discharge: 2012-10-28 | Disposition: A | Discharge status: Home / Self Care | Payer: PRIVATE HEALTH INSURANCE | Attending: Emergency Medicine | Admitting: Emergency Medicine

## 2012-10-28 DIAGNOSIS — Z7902 Long term (current) use of antithrombotics/antiplatelets: Secondary | ICD-10-CM | POA: Insufficient documentation

## 2012-10-28 DIAGNOSIS — F411 Generalized anxiety disorder: Secondary | ICD-10-CM | POA: Insufficient documentation

## 2012-10-28 DIAGNOSIS — Z880 Allergy status to penicillin: Secondary | ICD-10-CM | POA: Insufficient documentation

## 2012-10-28 DIAGNOSIS — Z7982 Long term (current) use of aspirin: Secondary | ICD-10-CM | POA: Insufficient documentation

## 2012-10-28 DIAGNOSIS — G8929 Other chronic pain: Secondary | ICD-10-CM | POA: Insufficient documentation

## 2012-10-28 DIAGNOSIS — Z8719 Personal history of other diseases of the digestive system: Secondary | ICD-10-CM | POA: Insufficient documentation

## 2012-10-28 DIAGNOSIS — Z8673 Personal history of transient ischemic attack (TIA), and cerebral infarction without residual deficits: Secondary | ICD-10-CM | POA: Insufficient documentation

## 2012-10-28 DIAGNOSIS — F17200 Nicotine dependence, unspecified, uncomplicated: Secondary | ICD-10-CM | POA: Insufficient documentation

## 2012-10-28 DIAGNOSIS — M549 Dorsalgia, unspecified: Secondary | ICD-10-CM | POA: Diagnosis present

## 2012-10-28 DIAGNOSIS — I10 Essential (primary) hypertension: Secondary | ICD-10-CM | POA: Insufficient documentation

## 2012-10-28 DIAGNOSIS — I2510 Atherosclerotic heart disease of native coronary artery without angina pectoris: Secondary | ICD-10-CM | POA: Insufficient documentation

## 2012-10-28 DIAGNOSIS — I252 Old myocardial infarction: Secondary | ICD-10-CM | POA: Insufficient documentation

## 2012-10-28 DIAGNOSIS — Z79899 Other long term (current) drug therapy: Secondary | ICD-10-CM | POA: Insufficient documentation

## 2012-10-28 DIAGNOSIS — M129 Arthropathy, unspecified: Secondary | ICD-10-CM | POA: Insufficient documentation

## 2012-10-28 DIAGNOSIS — Z8781 Personal history of (healed) traumatic fracture: Secondary | ICD-10-CM | POA: Insufficient documentation

## 2012-10-28 LAB — COMPREHENSIVE METABOLIC PANEL
ALT: 24 U/L (ref 0–53)
Albumin: 4.1 g/dL (ref 3.5–5.2)
Alkaline Phosphatase: 81 U/L (ref 39–117)
BUN: 25 mg/dL — ABNORMAL HIGH (ref 6–23)
CO2: 24 mEq/L (ref 19–32)
Chloride: 94 mEq/L — ABNORMAL LOW (ref 96–112)
GFR calc Af Amer: 75 mL/min — ABNORMAL LOW (ref >90)
Glucose, Bld: 95 mg/dL (ref 70–99)
Potassium: 3.6 mEq/L (ref 3.5–5.1)
Sodium: 133 mEq/L — ABNORMAL LOW (ref 135–145)
Total Bilirubin: 0.3 mg/dL (ref 0.3–1.2)
Total Protein: 8.4 g/dL — ABNORMAL HIGH (ref 6.0–8.3)

## 2012-10-28 LAB — CBC WITH DIFFERENTIAL/PLATELET
Basophils Absolute: 0.1 10*3/uL (ref 0.0–0.1)
Basophils Relative: 1 % (ref 0–1)
Eosinophils Absolute: 0.9 10*3/uL — ABNORMAL HIGH (ref 0.0–0.7)
Hemoglobin: 14 g/dL (ref 13.0–17.0)
Lymphs Abs: 4.4 10*3/uL — ABNORMAL HIGH (ref 0.7–4.0)
MCH: 29 pg (ref 26.0–34.0)
Monocytes Relative: 11 % (ref 3–12)
Neutro Abs: 5.8 10*3/uL (ref 1.7–7.7)
Neutrophils Relative %: 46 % (ref 43–77)
Platelets: 404 10*3/uL — ABNORMAL HIGH (ref 150–400)
RBC: 4.83 MIL/uL (ref 4.22–5.81)
WBC: 12.5 10*3/uL — ABNORMAL HIGH (ref 4.0–10.5)

## 2012-10-28 LAB — URINALYSIS, ROUTINE W REFLEX MICROSCOPIC
Bilirubin Urine: NEGATIVE
Glucose, UA: NEGATIVE mg/dL
Protein, ur: NEGATIVE mg/dL
Specific Gravity, Urine: 1.025 (ref 1.005–1.030)
Urobilinogen, UA: 0.2 mg/dL (ref 0.0–1.0)

## 2012-10-28 LAB — URINE MICROSCOPIC-ADD ON

## 2012-10-28 MED ORDER — HYDROMORPHONE HCL PF 1 MG/ML IJ SOLN
1.0000 mg | INTRAMUSCULAR | Status: AC
  Administered 2012-10-28: 1 mg via INTRAVENOUS
  Filled 2012-10-28: qty 1

## 2012-10-28 MED ORDER — OXYCODONE-ACETAMINOPHEN 5-325 MG PO TABS: 1.0000 | ORAL_TABLET | Freq: Four times a day (QID) | ORAL | Status: DC | PRN

## 2012-10-28 MED ORDER — SODIUM CHLORIDE 0.9 % IV BOLUS (SEPSIS)
1000.0000 mL | INTRAVENOUS | Status: AC
  Administered 2012-10-28: 1000 mL via INTRAVENOUS
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
## 2012-10-28 NOTE — ED Notes (Signed)
Pt c/o R side flank pain x 3 days.  Pain score 10/10.  Sts "I think I have gallstones."  Denies n/v/d.

## 2012-10-29 LAB — URINE CULTURE: Culture: NO GROWTH

## 2012-10-30 ENCOUNTER — Ambulatory Visit (HOSPITAL_COMMUNITY): Payer: PRIVATE HEALTH INSURANCE

## 2012-11-02 ENCOUNTER — Ambulatory Visit (HOSPITAL_COMMUNITY): Payer: PRIVATE HEALTH INSURANCE

## 2012-11-04 ENCOUNTER — Ambulatory Visit (HOSPITAL_COMMUNITY): Payer: PRIVATE HEALTH INSURANCE

## 2012-11-06 ENCOUNTER — Ambulatory Visit (HOSPITAL_COMMUNITY): Payer: PRIVATE HEALTH INSURANCE

## 2012-11-09 ENCOUNTER — Ambulatory Visit (HOSPITAL_COMMUNITY): Payer: PRIVATE HEALTH INSURANCE

## 2012-11-11 ENCOUNTER — Ambulatory Visit (HOSPITAL_COMMUNITY): Payer: PRIVATE HEALTH INSURANCE

## 2012-11-13 ENCOUNTER — Ambulatory Visit (HOSPITAL_COMMUNITY): Payer: PRIVATE HEALTH INSURANCE

## 2012-11-16 ENCOUNTER — Ambulatory Visit (HOSPITAL_COMMUNITY): Payer: PRIVATE HEALTH INSURANCE

## 2012-11-18 ENCOUNTER — Ambulatory Visit (HOSPITAL_COMMUNITY): Payer: PRIVATE HEALTH INSURANCE

## 2012-11-20 ENCOUNTER — Ambulatory Visit (HOSPITAL_COMMUNITY): Payer: PRIVATE HEALTH INSURANCE

## 2012-11-23 ENCOUNTER — Ambulatory Visit (HOSPITAL_COMMUNITY): Payer: PRIVATE HEALTH INSURANCE

## 2012-11-25 ENCOUNTER — Ambulatory Visit (HOSPITAL_COMMUNITY): Payer: PRIVATE HEALTH INSURANCE

## 2012-11-27 ENCOUNTER — Ambulatory Visit (HOSPITAL_COMMUNITY): Payer: PRIVATE HEALTH INSURANCE

## 2012-11-30 ENCOUNTER — Ambulatory Visit (HOSPITAL_COMMUNITY): Payer: PRIVATE HEALTH INSURANCE

## 2012-12-02 ENCOUNTER — Ambulatory Visit (HOSPITAL_COMMUNITY): Payer: PRIVATE HEALTH INSURANCE

## 2012-12-04 ENCOUNTER — Ambulatory Visit (HOSPITAL_COMMUNITY): Payer: PRIVATE HEALTH INSURANCE

## 2012-12-07 ENCOUNTER — Ambulatory Visit (HOSPITAL_COMMUNITY): Payer: PRIVATE HEALTH INSURANCE

## 2012-12-09 ENCOUNTER — Ambulatory Visit (HOSPITAL_COMMUNITY): Payer: PRIVATE HEALTH INSURANCE

## 2012-12-11 ENCOUNTER — Ambulatory Visit (HOSPITAL_COMMUNITY): Payer: PRIVATE HEALTH INSURANCE

## 2012-12-14 ENCOUNTER — Ambulatory Visit (HOSPITAL_COMMUNITY): Payer: PRIVATE HEALTH INSURANCE

## 2012-12-16 ENCOUNTER — Ambulatory Visit (HOSPITAL_COMMUNITY): Payer: PRIVATE HEALTH INSURANCE

## 2012-12-18 ENCOUNTER — Ambulatory Visit (HOSPITAL_COMMUNITY): Payer: PRIVATE HEALTH INSURANCE

## 2012-12-21 ENCOUNTER — Ambulatory Visit (HOSPITAL_COMMUNITY): Payer: PRIVATE HEALTH INSURANCE

## 2012-12-23 ENCOUNTER — Ambulatory Visit (HOSPITAL_COMMUNITY): Payer: PRIVATE HEALTH INSURANCE

## 2012-12-25 ENCOUNTER — Ambulatory Visit (HOSPITAL_COMMUNITY): Payer: PRIVATE HEALTH INSURANCE

## 2012-12-28 ENCOUNTER — Ambulatory Visit (HOSPITAL_COMMUNITY): Payer: PRIVATE HEALTH INSURANCE

## 2012-12-30 ENCOUNTER — Ambulatory Visit (HOSPITAL_COMMUNITY): Payer: PRIVATE HEALTH INSURANCE

## 2013-01-01 ENCOUNTER — Ambulatory Visit (HOSPITAL_COMMUNITY): Payer: PRIVATE HEALTH INSURANCE

## 2013-01-04 ENCOUNTER — Ambulatory Visit (HOSPITAL_COMMUNITY): Payer: PRIVATE HEALTH INSURANCE

## 2013-01-06 ENCOUNTER — Ambulatory Visit (HOSPITAL_COMMUNITY): Payer: PRIVATE HEALTH INSURANCE

## 2013-01-08 ENCOUNTER — Ambulatory Visit (HOSPITAL_COMMUNITY): Payer: PRIVATE HEALTH INSURANCE

## 2013-02-16 ENCOUNTER — Encounter: Payer: Self-pay | Admitting: Cardiology

## 2013-02-26 ENCOUNTER — Ambulatory Visit: Payer: PRIVATE HEALTH INSURANCE | Admitting: Cardiology

## 2013-03-03 ENCOUNTER — Other Ambulatory Visit: Payer: Self-pay | Admitting: Cardiology

## 2013-03-03 ENCOUNTER — Ambulatory Visit (INDEPENDENT_AMBULATORY_CARE_PROVIDER_SITE_OTHER): Payer: PRIVATE HEALTH INSURANCE | Admitting: Cardiology

## 2013-03-03 ENCOUNTER — Encounter: Payer: Self-pay | Admitting: Cardiology

## 2013-03-03 VITALS — BP 154/102 | HR 71 | Ht 67.0 in | Wt 196.0 lb

## 2013-03-03 DIAGNOSIS — I255 Ischemic cardiomyopathy: Secondary | ICD-10-CM

## 2013-03-03 DIAGNOSIS — Z720 Tobacco use: Secondary | ICD-10-CM

## 2013-03-03 DIAGNOSIS — I5022 Chronic systolic (congestive) heart failure: Secondary | ICD-10-CM

## 2013-03-03 DIAGNOSIS — I2589 Other forms of chronic ischemic heart disease: Secondary | ICD-10-CM

## 2013-03-03 DIAGNOSIS — F17200 Nicotine dependence, unspecified, uncomplicated: Secondary | ICD-10-CM

## 2013-03-03 DIAGNOSIS — I10 Essential (primary) hypertension: Secondary | ICD-10-CM

## 2013-03-03 MED ORDER — NITROGLYCERIN 0.4 MG SL SUBL: 0.4000 mg | SUBLINGUAL_TABLET | SUBLINGUAL | Status: DC | PRN

## 2013-03-03 MED ORDER — LOSARTAN POTASSIUM-HCTZ 100-25 MG PO TABS: 1.0000 | ORAL_TABLET | Freq: Every day | ORAL | Status: DC

## 2013-03-03 MED ORDER — METOPROLOL TARTRATE 100 MG PO TABS: 100.0000 mg | ORAL_TABLET | Freq: Two times a day (BID) | ORAL | Status: DC

## 2013-03-03 MED ORDER — CLOPIDOGREL BISULFATE 75 MG PO TABS: 75.0000 mg | ORAL_TABLET | Freq: Every day | ORAL | Status: DC

## 2013-03-03 MED ORDER — ATORVASTATIN CALCIUM 80 MG PO TABS: 80.0000 mg | ORAL_TABLET | Freq: Every day | ORAL | Status: DC

## 2013-03-03 MED ORDER — SPIRONOLACTONE 25 MG PO TABS: 25.0000 mg | ORAL_TABLET | Freq: Every day | ORAL | Status: DC
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
## 2013-03-03 NOTE — Patient Instructions (Signed)
Your physician has recommended you make the following change in your medication:   1. Start Spironolactone 25 mg once daily  Your physician recommends that you return for lab work in: 1 week BMET  [facility].  Your physician wants you to follow-up in: 6 months with Dr. Anne FuSkains. You will receive a reminder letter in the mail two months in advance. If you don't receive a letter, please call our office to schedule the follow-up appointment.

## 2013-03-10 ENCOUNTER — Other Ambulatory Visit: Payer: PRIVATE HEALTH INSURANCE

## 2013-03-11 ENCOUNTER — Other Ambulatory Visit (INDEPENDENT_AMBULATORY_CARE_PROVIDER_SITE_OTHER): Payer: PRIVATE HEALTH INSURANCE

## 2013-03-11 ENCOUNTER — Encounter: Payer: Self-pay | Admitting: Physician Assistant

## 2013-03-11 ENCOUNTER — Ambulatory Visit (INDEPENDENT_AMBULATORY_CARE_PROVIDER_SITE_OTHER): Payer: PRIVATE HEALTH INSURANCE | Admitting: Physician Assistant

## 2013-03-11 VITALS — BP 120/90 | HR 63 | Ht 67.0 in | Wt 199.0 lb

## 2013-03-11 DIAGNOSIS — E785 Hyperlipidemia, unspecified: Secondary | ICD-10-CM | POA: Insufficient documentation

## 2013-03-11 DIAGNOSIS — I2589 Other forms of chronic ischemic heart disease: Secondary | ICD-10-CM

## 2013-03-11 DIAGNOSIS — I10 Essential (primary) hypertension: Secondary | ICD-10-CM

## 2013-03-11 DIAGNOSIS — Z9861 Coronary angioplasty status: Secondary | ICD-10-CM

## 2013-03-11 DIAGNOSIS — I2510 Atherosclerotic heart disease of native coronary artery without angina pectoris: Secondary | ICD-10-CM | POA: Insufficient documentation

## 2013-03-11 DIAGNOSIS — I255 Ischemic cardiomyopathy: Secondary | ICD-10-CM

## 2013-03-11 LAB — BASIC METABOLIC PANEL
BUN: 17 mg/dL (ref 6–23)
CHLORIDE: 99 meq/L (ref 96–112)
CO2: 29 meq/L (ref 19–32)
Calcium: 8.9 mg/dL (ref 8.4–10.5)
Creatinine, Ser: 1.1 mg/dL (ref 0.4–1.5)
GFR: 73.63 mL/min (ref >60.00)
GLUCOSE: 93 mg/dL (ref 70–99)
Potassium: 3.7 mEq/L (ref 3.5–5.1)
Sodium: 134 mEq/L — ABNORMAL LOW (ref 135–145)

## 2013-03-11 NOTE — Progress Notes (Signed)
[address] [place], [place]  [address] Phone: [phone] Fax:  [phone]  Date:  03/11/2013   ID:  Austin KielJames Corral, DOB 10/07/1960, MRN [id number]  PCP:  Default, Provider, MD  Cardiologist:  Dr. Donato SchultzMark [name]     History of Present Illness: Austin Simmons is a 53 y.o. male with a prior history of myocardial infarction in the setting of cocaine use, HTN, tobacco abuse.  He was seen in the hospital in 07/2012 with ongoing chest pain. Enzymes remained negative but nuclear study demonstrated cardiomyopathy with an EF of 25% and inferior scar with peri-infarct ischemia.  LHC demonstrated high-grade disease in the mid LAD which was treated with a bare-metal stent.  Of note, the patient continued to have ongoing chest pain during that admission requiring Toradol and/or morphine for treatment.  He was seen back in follow up by Dr. Anne FuSkains last week.  He was placed on spironolactone. Notes indicate he was supposed to follow up with our pharmacy team this week.  He is on my schedule today.  Patient asked to be seen today. He is here with his son. His son has many questions about his father's cardiac diagnosis. The patient denies chest pain, significant dyspnea, orthopnea, PND, edema or syncope.  Echocardiogram (12/2010): EF 60-65%, grade 1 diastolic dysfunction. Myoview (07/2012): Inferior lateral infarct with minimal amount of peri-infarct ischemia, EF 25%. LHC (08/11/12): Mid LAD 90, distal RCA 60-70, EF 30%. PCI: Veriflex (4 x 12 mm) BMS to the LAD.  RCA tx medically.    Recent Labs: 10/28/2012: ALT 24; Creatinine 1.27; Hemoglobin 14.0; Potassium 3.6   Wt Readings from Last 3 Encounters:  03/03/13 196 lb (88.905 kg)  08/14/12 189 lb 9.5 oz (86 kg)  08/14/12 189 lb 9.5 oz (86 kg)     Past Medical History  Diagnosis Date  . Anxiety   . Hypertension   . Migraines     "I get them q few days" (08/11/2012)  . Orthopnoea   . GERD (gastroesophageal reflux disease)   . Stroke 1990's   "memory problems since" (08/11/2012)  . Skull fracture 1974    "left me w/this stutter & migraine headaches; had to learn to walk, talk, everything again" (08/11/2012)  . Arthritis     "all over" (08/11/2012)  . Chronic lower back pain   . Myocardial infarction 2013    "twice" (08/11/2012)  . 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 Other primary cardiomyopathies   . Intermediate coronary syndrome   . Coronary artery disease     Current Outpatient Prescriptions  Medication Sig Dispense Refill  . aspirin 81 MG chewable tablet Chew 81 mg by mouth daily.       Marland Kitchen. atorvastatin (LIPITOR) 80 MG tablet Take 1 tablet (80 mg total) by mouth daily at 6 PM.  30 tablet  4  . cloNIDine (CATAPRES) 0.1 MG tablet TAKE 1 TABLET BY MOUTH TWICE DAILY  60 tablet  5  . clopidogrel (PLAVIX) 75 MG tablet Take 1 tablet (75 mg total) by mouth daily with breakfast.  30 tablet  0  . ibuprofen (ADVIL,MOTRIN) 200 MG tablet Take 400 mg by mouth every 6 (six) hours as needed for pain (PAIN).      Marland Kitchen. losartan-hydrochlorothiazide (HYZAAR) 100-25 MG per tablet Take 1 tablet by mouth daily.  30 tablet  5  . metoprolol (LOPRESSOR) 100 MG tablet Take 1 tablet (100 mg total) by mouth 2 (two) times daily.  60 tablet  4  . nitroGLYCERIN (NITROSTAT) 0.4 MG SL tablet Place 1 tablet (0.4 mg total) under the tongue every 5 (five) minutes as needed. Chest pain  30 tablet  2  . oxyCODONE-acetaminophen (PERCOCET) 5-325 MG per tablet Take 1 tablet by mouth every 6 (six) hours as needed for pain.  15 tablet  0  . spironolactone (ALDACTONE) 25 MG tablet Take 1 tablet (25 mg total) by mouth daily.  30 tablet  3   No current facility-administered medications for this visit.    Allergies:   Penicillins   Social History:  The patient  reports that he has been smoking Cigarettes.  He has a 30 pack-year smoking history. He has never used smokeless tobacco. He reports that he uses illicit drugs  (Marijuana). He reports that he does not drink alcohol.   Family History:  The patient's family history is not on file.   ROS:  Please see the history of present illness.   He suffers with chronic anxiety.   All other systems reviewed and negative.   PHYSICAL EXAM: VS:  BP 120/90  Pulse 63  Ht 5\' 7"  (1.702 m)  Wt 199 lb (90.266 kg)  BMI 31.16 kg/m2 Well nourished, well developed, in no acute distress HEENT: normal Neck: no JVD Cardiac:  normal S1, S2; RRR; no murmur Lungs:  clear to auscultation bilaterally, no wheezing, rhonchi or rales Abd: soft, nontender, no hepatomegaly Ext: no edema Skin: warm and dry Neuro:  CNs 2-12 intact, no focal abnormalities noted  EKG:  NSR, HR 63, normal axis, nonspecific ST-T wave changes     ASSESSMENT AND PLAN:  1. CAD: No angina. Continue aspirin, Plavix, statin. 2. Ischemic cardiomyopathy: Continue beta blocker, ARB, spironolactone. Obtain a BMET today. I reviewed the patient's diagnosis with him and his son today. We discussed the possibility of proceeding with follow up echocardiogram once his medicines are maximized and his blood pressure is well controlled. We also discussed the possibility of referral to EP for ICD implantation should his ejection fraction remains less than 35%. 3. Hypertension: Blood pressure is much better controlled. Continue current therapy. 4. Hyperlipidemia: Continue statin. 5. Tobacco abuse: I recommended that he quit. 6. Anxiety disorder: I have referred [facility] to [facility] for [facility]. 7. Disposition: Follow up with Dr. Anne Fu as planned.  Signed, Tereso Newcomer, PA-C  03/11/2013 8:21 AM

## 2013-03-11 NOTE — Progress Notes (Signed)
Quick Note:  Preliminary report reviewed by triage nurse and sent to MD desk. ______ 

## 2013-03-11 NOTE — Patient Instructions (Addendum)
Metoprolol is a drug called a beta blocker.  It helps lower your blood pressure and to improve the squeezing function of your heart. Hyzaar (Losartan/HCTZ) contains a drug call Losartan.  This is an ARB (angiotensin receptor blocker).  It also helps lower your blood pressure and helps your heart squeeze stronger. Spironolactone is a diuretic (water pill) that also helps your heart squeeze stronger.  It also lowers your blood pressure. Aspirin and Plavix are blood thinners that help keep your stent open and prevent heart attacks. Atorvastatin (Lipitor) is a cholesterol medication that helps reduce the chances of more blockages building up in your heart. Clonidine is a blood pressure medication.  Do not stop it.  If you stop it suddenly it will cause your blood pressure to go dangerously high.     You have been referred to [facility] HTN, HLD

## 2013-03-16 ENCOUNTER — Telehealth: Payer: Self-pay | Admitting: Pharmacist

## 2013-03-16 DIAGNOSIS — I5022 Chronic systolic (congestive) heart failure: Secondary | ICD-10-CM
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
## 2013-03-23 ENCOUNTER — Other Ambulatory Visit: Payer: Self-pay | Admitting: Cardiology

## 2013-03-23 DIAGNOSIS — I255 Ischemic cardiomyopathy: Secondary | ICD-10-CM

## 2013-03-23 DIAGNOSIS — F419 Anxiety disorder, unspecified: Secondary | ICD-10-CM

## 2013-03-23 DIAGNOSIS — I2510 Atherosclerotic heart disease of native coronary artery without angina pectoris: Secondary | ICD-10-CM

## 2013-03-31 ENCOUNTER — Encounter: Payer: Self-pay | Admitting: Pharmacist

## 2013-03-31 ENCOUNTER — Ambulatory Visit (INDEPENDENT_AMBULATORY_CARE_PROVIDER_SITE_OTHER): Payer: PRIVATE HEALTH INSURANCE | Admitting: Pharmacist

## 2013-03-31 DIAGNOSIS — I5022 Chronic systolic (congestive) heart failure: Secondary | ICD-10-CM

## 2013-03-31 LAB — BASIC METABOLIC PANEL
BUN: 19 mg/dL (ref 6–23)
CO2: 27 meq/L (ref 19–32)
CREATININE: 1.2 mg/dL (ref 0.4–1.5)
Calcium: 9.1 mg/dL (ref 8.4–10.5)
Chloride: 101 mEq/L (ref 96–112)
GFR: 67.98 mL/min (ref >60.00)
GLUCOSE: 116 mg/dL — AB (ref 70–99)
Potassium: 5 mEq/L (ref 3.5–5.1)
Sodium: 136 mEq/L (ref 135–145)

## 2013-03-31 NOTE — Progress Notes (Signed)
Austin Simmons is 53 yoM who presents today with his 51 yo son for his monthly BMET in the setting of new start spironolactone. 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 During our last phone call, I offered the opportunity to have a face-to-face visit to discuss possible options for smoking cessation.  Smoking history: Smoking since the age of 53, [place] 1-1.5 packs per day (~40 pack year history)  Pts son reports patient smokes immediately upon waking and further triggered by stress, anxiety in his life. He is a single parent, who has raised his two sons, who have been having some financial difficulties themselves. All members of the household are smokers including his present girlfriend.   Patient admits to having tried quitting but many years ago (tried patch, stopped prior to full 12 weeks). Patient admits to having seizures at one point in his life but also says it was many years ago. Denies any psychiatric symptoms of suicidal ideations, depression.   When asked to rate willingness to quit smoking, patient easily answered 10 on a 10 point scale and made it clear that he felt his health was more important than smoking.   Current Outpatient Prescriptions on File Prior to Visit  Medication Sig Dispense Refill  . aspirin 81 MG chewable tablet Chew 81 mg by mouth daily.       Marland Kitchen. atorvastatin (LIPITOR) 80 MG tablet Take 1 tablet (80 mg total) by mouth daily at 6 PM.  30 tablet  4  . cloNIDine (CATAPRES) 0.1 MG tablet TAKE 1 TABLET BY MOUTH TWICE DAILY  60 tablet  5  . clopidogrel (PLAVIX) 75 MG tablet Take 1 tablet (75 mg total) by mouth daily with breakfast.  30 tablet  0  . ibuprofen (ADVIL,MOTRIN) 200 MG tablet Take 400 mg by mouth every 6 (six) hours as needed for pain (PAIN).      Marland Kitchen. losartan-hydrochlorothiazide (HYZAAR) 100-25 MG per tablet Take 1 tablet by mouth daily.  30 tablet  5  . metoprolol (LOPRESSOR) 100 MG tablet Take 1 tablet (100 mg total) by mouth 2 (two) times daily.   60 tablet  4  . nitroGLYCERIN (NITROSTAT) 0.4 MG SL tablet Place 1 tablet (0.4 mg total) under the tongue every 5 (five) minutes as needed. Chest pain  30 tablet  2  . oxyCODONE-acetaminophen (PERCOCET) 5-325 MG per tablet Take 1 tablet by mouth every 6 (six) hours as needed for pain.  15 tablet  0  . spironolactone (ALDACTONE) 25 MG tablet Take 1 tablet (25 mg total) by mouth daily.  30 tablet  3   No current facility-administered medications on file prior to visit.    Assessment/Plan: I will not start any therapy at this time, though patient encouraged to find PCP who can provide adequate follow-up for smoking cessation. However, given patient's history of MI and recent newly discovered cardiomyopathy, recommended against nicotine replacement therapy. Discussed Wellbutrin and Chantix as possible options for the future, but made aware that this would have to be a family effort to remove possible triggers in the household. If initiated, medication would be started 1 week prior to quit date. I will continue to follow patient's progress, willingness, and ability to schedule appt with PCP for the duration of the spironolactone research study. BMET drawn today.   Tyrone NineAlvin B. Artelia Larocheung, PharmD Clinical Pharmacist - Resident Phone: [phone] Pager: [phone] 03/31/2013 1:01 PM

## 2013-03-31 NOTE — Telephone Encounter (Signed)
This encounter was created in error - please disregard.

## 2013-04-02 ENCOUNTER — Telehealth: Payer: Self-pay | Admitting: Pharmacist

## 2013-04-02 ENCOUNTER — Telehealth: Payer: Self-pay | Admitting: *Deleted

## 2013-04-02 DIAGNOSIS — I5032 Chronic diastolic (congestive) heart failure: Secondary | ICD-10-CM

## 2013-04-02 NOTE — Telephone Encounter (Signed)
TO Dr Anne FuSkains to advise. [facility] did look [facility] and [facility] were [facility] There is nothing on his epic chart either? Would you like me to have pt call PCP to get a Heart burn medication or are you ok with prescribing something for pt?

## 2013-04-02 NOTE — Telephone Encounter (Signed)
Patient stated he needs a refill on his heartburn medication. He is out and has been taking an over the counter form of this, but it has not been helping. He said that Dr Anne FuSkains has refilled this for him before but I do not even see it on his list. He would like this sent to [facility]. Please advise. Thanks, MI

## 2013-04-02 NOTE — Telephone Encounter (Signed)
Aldosterone Receptor Antagonist Initiation Clinic Study - Pharmacist Monthly Follow-Up Patient was contacted via telephone today in efforts to discuss the following:  Spironolactone Order Date: 03/03/2013 Last BMET: 03/31/2013; K+ 5.0, SCr 1.    Medication Availability: yes   BMET labs were reviewed with patient    Reinforced benefits vs risks of spironolactone, including its indication, interactions, importance of adherence, and management of missed doses.   Does the patient  feel that his/her medications are working for him/her?  yes   Has the patient been experiencing any side effects to the medications prescribed?  no   Understanding of regimen: fair   Understanding of indications: fair   Potential for compliance: fair  Assessment & Plan:   Follow-up call for patient's lab results. Potassium significantly increased to 5.0. However given patient's stable renal function and no potassium supplementation, I am comfortable with checking another BMET in 1 month.  Saw patient with son yesterday to discuss smoking cessation. At this time, patient complains of a dry cough that has been persistent even before starting spironolactone. Approximately 2 months ago, patient's son at home was very ill with cough, chills, and fevers. Mr. [name] reports also feeling sick shortly after but with quick resolution. A few weeks later, he started noticing a mucus-filled cough which now presents as more dry. This is complicated by the fact that he currently still smokes 1-1.5 packs per day. Patient advised to schedule appointment with PCP and to try Delsym OTC in the meantime.   Next scheduled BMET: Tuesday April 27, 2013  Tyrone NineAlvin B. Artelia Larocheung, PharmD Clinical Pharmacist - Resident Phone: [phone] Pager: [phone] 04/02/2013 10:25 AM

## 2013-04-05 NOTE — Telephone Encounter (Signed)
Pt was not happy but verbalized understanding and said he would call PCP.

## 2013-04-05 NOTE — Telephone Encounter (Signed)
If you don't mind, please have his primary physician take care of this.

## 2013-04-05 NOTE — Telephone Encounter (Signed)
TO Kenyatta to make aware.

## 2013-04-19 ENCOUNTER — Encounter: Payer: Self-pay | Admitting: Internal Medicine

## 2013-04-19 ENCOUNTER — Ambulatory Visit: Payer: PRIVATE HEALTH INSURANCE | Attending: Internal Medicine | Admitting: Internal Medicine

## 2013-04-19 VITALS — BP 127/87 | HR 81 | Temp 97.8°F | Resp 20 | Ht 69.0 in | Wt 198.0 lb

## 2013-04-19 DIAGNOSIS — E781 Pure hyperglyceridemia: Secondary | ICD-10-CM

## 2013-04-19 DIAGNOSIS — I2589 Other forms of chronic ischemic heart disease: Secondary | ICD-10-CM | POA: Insufficient documentation

## 2013-04-19 DIAGNOSIS — R471 Dysarthria and anarthria: Secondary | ICD-10-CM

## 2013-04-19 DIAGNOSIS — F411 Generalized anxiety disorder: Secondary | ICD-10-CM | POA: Insufficient documentation

## 2013-04-19 DIAGNOSIS — F3289 Other specified depressive episodes: Secondary | ICD-10-CM | POA: Insufficient documentation

## 2013-04-19 DIAGNOSIS — Z8673 Personal history of transient ischemic attack (TIA), and cerebral infarction without residual deficits: Secondary | ICD-10-CM | POA: Insufficient documentation

## 2013-04-19 DIAGNOSIS — G8929 Other chronic pain: Secondary | ICD-10-CM | POA: Insufficient documentation

## 2013-04-19 DIAGNOSIS — E039 Hypothyroidism, unspecified: Secondary | ICD-10-CM

## 2013-04-19 DIAGNOSIS — Z79899 Other long term (current) drug therapy: Secondary | ICD-10-CM | POA: Insufficient documentation

## 2013-04-19 DIAGNOSIS — M545 Low back pain, unspecified: Secondary | ICD-10-CM | POA: Insufficient documentation

## 2013-04-19 DIAGNOSIS — F329 Major depressive disorder, single episode, unspecified: Secondary | ICD-10-CM | POA: Insufficient documentation

## 2013-04-19 DIAGNOSIS — Z880 Allergy status to penicillin: Secondary | ICD-10-CM | POA: Insufficient documentation

## 2013-04-19 DIAGNOSIS — M549 Dorsalgia, unspecified: Secondary | ICD-10-CM

## 2013-04-19 DIAGNOSIS — I252 Old myocardial infarction: Secondary | ICD-10-CM | POA: Insufficient documentation

## 2013-04-19 DIAGNOSIS — Z7982 Long term (current) use of aspirin: Secondary | ICD-10-CM | POA: Insufficient documentation

## 2013-04-19 DIAGNOSIS — K219 Gastro-esophageal reflux disease without esophagitis: Secondary | ICD-10-CM | POA: Insufficient documentation

## 2013-04-19 DIAGNOSIS — I2510 Atherosclerotic heart disease of native coronary artery without angina pectoris: Secondary | ICD-10-CM | POA: Insufficient documentation

## 2013-04-19 DIAGNOSIS — F17200 Nicotine dependence, unspecified, uncomplicated: Secondary | ICD-10-CM | POA: Insufficient documentation

## 2013-04-19 DIAGNOSIS — I10 Essential (primary) hypertension: Secondary | ICD-10-CM | POA: Insufficient documentation

## 2013-04-19 LAB — COMPLETE METABOLIC PANEL WITH GFR
ALT: 20 U/L (ref 0–53)
AST: 17 U/L (ref 0–37)
Albumin: 4.3 g/dL (ref 3.5–5.2)
Alkaline Phosphatase: 62 U/L (ref 39–117)
BUN: 19 mg/dL (ref 6–23)
CHLORIDE: 100 meq/L (ref 96–112)
CO2: 28 meq/L (ref 19–32)
CREATININE: 1.06 mg/dL (ref 0.50–1.35)
Calcium: 9.3 mg/dL (ref 8.4–10.5)
GFR, Est Non African American: 81 mL/min
Glucose, Bld: 102 mg/dL — ABNORMAL HIGH (ref 70–99)
POTASSIUM: 4.7 meq/L (ref 3.5–5.3)
Sodium: 135 mEq/L (ref 135–145)
Total Bilirubin: 0.3 mg/dL (ref 0.2–1.2)
Total Protein: 7.2 g/dL (ref 6.0–8.3)

## 2013-04-19 LAB — TSH: TSH: 32.613 u[IU]/mL — AB (ref 0.350–4.500)

## 2013-04-19 LAB — LIPID PANEL
Cholesterol: 148 mg/dL (ref 0–200)
HDL: 29 mg/dL — AB (ref >39)
Total CHOL/HDL Ratio: 5.1 Ratio
Triglycerides: 410 mg/dL — ABNORMAL HIGH (ref <150)

## 2013-04-19 LAB — T4, FREE: Free T4: 0.6 ng/dL — ABNORMAL LOW (ref 0.80–1.80)

## 2013-04-19 MED ORDER — LOSARTAN POTASSIUM-HCTZ 100-25 MG PO TABS: 1.0000 | ORAL_TABLET | Freq: Every day | ORAL | Status: DC

## 2013-04-19 MED ORDER — METOPROLOL TARTRATE 100 MG PO TABS
100.0000 mg | ORAL_TABLET | Freq: Two times a day (BID) | ORAL | Status: DC
Start: 2013-04-19 — End: 2013-12-14

## 2013-04-19 MED ORDER — ATORVASTATIN CALCIUM 80 MG PO TABS
80.0000 mg | ORAL_TABLET | Freq: Every day | ORAL | Status: DC
Start: 2013-04-19 — End: 2013-11-22

## 2013-04-19 MED ORDER — BUPROPION HCL 100 MG PO TABS: 100.0000 mg | ORAL_TABLET | Freq: Two times a day (BID) | ORAL | Status: DC

## 2013-04-19 MED ORDER — PANTOPRAZOLE SODIUM 40 MG PO TBEC: 40.0000 mg | DELAYED_RELEASE_TABLET | Freq: Every day | ORAL | Status: AC

## 2013-04-19 MED ORDER — CLOPIDOGREL BISULFATE 75 MG PO TABS: 75.0000 mg | ORAL_TABLET | Freq: Every day | ORAL | Status: DC

## 2013-04-19 MED ORDER — ACETAMINOPHEN-CODEINE #2 300-15 MG PO TABS: 1.0000 | ORAL_TABLET | ORAL | Status: DC | PRN

## 2013-04-19 MED ORDER — SPIRONOLACTONE 25 MG PO TABS
25.0000 mg | ORAL_TABLET | Freq: Every day | ORAL | Status: DC
Start: 2013-04-19 — End: 2013-12-14

## 2013-04-19 MED ORDER — BUPROPION HCL 100 MG PO TABS
100.0000 mg | ORAL_TABLET | Freq: Two times a day (BID) | ORAL | Status: DC
Start: 2013-04-19 — End: 2013-09-08
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
## 2013-04-19 NOTE — Patient Instructions (Signed)
Patient instructed to stop taking potassium supplement, he will be instructed to reinitiate potassium based on his level
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
## 2013-04-20 LAB — VITAMIN D 25 HYDROXY (VIT D DEFICIENCY, FRACTURES): Vit D, 25-Hydroxy: 24 ng/mL — ABNORMAL LOW (ref 30–89)

## 2013-04-21 ENCOUNTER — Telehealth: Payer: Self-pay | Admitting: Emergency Medicine

## 2013-04-21 MED ORDER — LEVOTHYROXINE SODIUM 50 MCG PO TABS: 50.0000 ug | ORAL_TABLET | Freq: Every day | ORAL | Status: DC

## 2013-04-21 NOTE — Addendum Note (Signed)
Addended by: Susie CassetteABROL MD, Germain OsgoodNAYANA on: 04/21/2013 11:03 AM   Modules accepted: Orders

## 2013-04-21 NOTE — Addendum Note (Signed)
Addended by: Susie CassetteABROL MD, Germain OsgoodNAYANA on: 04/21/2013 11:07 AM   Modules accepted: Orders

## 2013-04-21 NOTE — Telephone Encounter (Signed)
Pt informed of abnormal lab results. He will pick medication at pharmacy her tomorrow. Repeat blood work scheduled with f/u 2 mnth appt

## 2013-04-21 NOTE — Telephone Encounter (Signed)
Message copied by Darlis LoanSMITH, JILL D on Wed Apr 21, 2013  5:39 PM ------      Message from: Susie CassetteABROL MD, [facility]YA[facility]      Created: Wed Apr 21, 2013 11:05 AM       Patient that his potassium is 4.7. His TSH is significantly elevated. The patient is severely hypothyroid. [facility] The patient should become this prescription ASAP.      Because of his hypothyroidism the  triglycerides are elevated      We will repeat his thyroid function, lipid panel in 8 weeks ------

## 2013-04-22 LAB — STRIATED MUSCLE ANTIBODY: Striated Muscle Ab: <1:40 {titer}

## 2013-04-22 LAB — ACETYLCHOLINE RECEPTOR, BINDING: A CHR BINDING ABS: <0.3 nmol/L (ref <=0.30)

## 2013-04-26 ENCOUNTER — Telehealth: Payer: Self-pay | Admitting: Emergency Medicine

## 2013-04-26 NOTE — Telephone Encounter (Signed)
Pt given lab results 

## 2013-04-26 NOTE — Telephone Encounter (Signed)
Message copied by Darlis LoanSMITH, Semaya Vida D on Mon Apr 26, 2013  4:03 PM ------      Message from: Susie CassetteABROL MD, Germain OsgoodNAYANA      Created: Fri Apr 23, 2013  2:47 PM       Patient's blood test for myasthenia gravis is negative ------

## 2013-04-27 ENCOUNTER — Encounter (INDEPENDENT_AMBULATORY_CARE_PROVIDER_SITE_OTHER): Payer: Self-pay

## 2013-04-27 ENCOUNTER — Ambulatory Visit (INDEPENDENT_AMBULATORY_CARE_PROVIDER_SITE_OTHER): Payer: PRIVATE HEALTH INSURANCE | Admitting: Neurology

## 2013-04-27 ENCOUNTER — Other Ambulatory Visit: Payer: PRIVATE HEALTH INSURANCE

## 2013-04-27 ENCOUNTER — Encounter: Payer: Self-pay | Admitting: Neurology

## 2013-04-27 VITALS — BP 158/94 | HR 65 | Ht 69.0 in | Wt 168.0 lb

## 2013-04-27 DIAGNOSIS — M549 Dorsalgia, unspecified: Secondary | ICD-10-CM

## 2013-04-27 NOTE — Patient Instructions (Signed)
Back Pain, Adult Low back pain is very common. About 1 in 5 people have back pain.The cause of low back pain is rarely dangerous. The pain often gets better over time.About half of people with a sudden onset of back pain feel better in just 2 weeks. About 8 in 10 people feel better by 6 weeks.  CAUSES Some common causes of back pain include:  Strain of the muscles or ligaments supporting the spine.  Wear and tear (degeneration) of the spinal discs.  Arthritis.  Direct injury to the back. DIAGNOSIS Most of the time, the direct cause of low back pain is not known.However, back pain can be treated effectively even when the exact cause of the pain is unknown.Answering your caregiver's questions about your overall health and symptoms is one of the most accurate ways to make sure the cause of your pain is not dangerous. If your caregiver needs more information, he or she may order lab work or imaging tests (X-rays or MRIs).However, even if imaging tests show changes in your back, this usually does not require surgery. HOME CARE INSTRUCTIONS For many people, back pain returns.Since low back pain is rarely dangerous, it is often a condition that people can learn to manageon their own.   Remain active. It is stressful on the back to sit or stand in one place. Do not sit, drive, or stand in one place for more than 30 minutes at a time. Take short walks on level surfaces as soon as pain allows.Try to increase the length of time you walk each day.  Do not stay in bed.Resting more than 1 or 2 days can delay your recovery.  Do not avoid exercise or work.Your body is made to move.It is not dangerous to be active, even though your back may hurt.Your back will likely heal faster if you return to being active before your pain is gone.  Pay attention to your body when you bend and lift. Many people have less discomfortwhen lifting if they bend their knees, keep the load close to their bodies,and  avoid twisting. Often, the most comfortable positions are those that put less stress on your recovering back.  Find a comfortable position to sleep. Use a firm mattress and lie on your side with your knees slightly bent. If you lie on your back, put a pillow under your knees.  Only take over-the-counter or prescription medicines as directed by your caregiver. Over-the-counter medicines to reduce pain and inflammation are often the most helpful.Your caregiver may prescribe muscle relaxant drugs.These medicines help dull your pain so you can more quickly return to your normal activities and healthy exercise.  Put ice on the injured area.  Put ice in a plastic bag.  Place a towel between your skin and the bag.  Leave the ice on for 15-20 minutes, 03-04 times a day for the first 2 to 3 days. After that, ice and heat may be alternated to reduce pain and spasms.  Ask your caregiver about trying back exercises and gentle massage. This may be of some benefit.  Avoid feeling anxious or stressed.Stress increases muscle tension and can worsen back pain.It is important to recognize when you are anxious or stressed and learn ways to manage it.Exercise is a great option. SEEK MEDICAL CARE IF:  You have pain that is not relieved with rest or medicine.  You have pain that does not improve in 1 week.  You have new symptoms.  You are generally not feeling well. SEEK   IMMEDIATE MEDICAL CARE IF:   You have pain that radiates from your back into your legs.  You develop new bowel or bladder control problems.  You have unusual weakness or numbness in your arms or legs.  You develop nausea or vomiting.  You develop abdominal pain.  You feel faint. Document Released: 01/07/2005 Document Revised: 07/09/2011 Document Reviewed: 05/28/2010 [employer] Patient Information 2014 [employer].  
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
## 2013-04-28 ENCOUNTER — Telehealth: Payer: Self-pay | Admitting: Pharmacist

## 2013-04-28 DIAGNOSIS — I5022 Chronic systolic (congestive) heart failure: Secondary | ICD-10-CM

## 2013-04-28 NOTE — Telephone Encounter (Signed)
Aldosterone Receptor Antagonist Initiation Clinic Study - Pharmacist Second Monthly Follow-Up Patient was contacted via telephone today in efforts to discuss the following:  Spironolactone Order Date: 03/03/2013  Last BMET: 04/19/13, K+ 4.7, SCr 1.06    Medication Availability: yes   BMET labs were reviewed with patient    Reinforced benefits vs risks of spironolactone, including its indication, interactions, importance of adherence, and management of missed doses.   Has the patient been measuring his/her own blood pressure or blood glucose at home?  Yes, BP 129/90s    Does the patient  feel that his/her medications are working for him/her?  yes   Has the patient been experiencing any side effects to the medications prescribed?  no   Understanding of regimen: fair   Understanding of indications: fair   Potential for compliance: good  Assessment & Plan:   Mr. [name] is a 53 yo WM with history of cocaine and marijuana abuse in the past, present smoking history, and significant history of strokes that has left him with some slurred speech. At my last visit with him in clinic, patient was complaining of a dry cough and "loss of speech" which seems to have resolved, though unclear if it was the result of using Delsym. Of note, patient is also being seen by Dr. Susie CassetteAbrol now as well, who started levothyroxine on 04/21/13. Patient reports satisfaction with medications. I confirmed with him that his potassium and SCr were within normal limits and that we would recheck his third monthly follow-up in the beginning of May. Patient is agreeable to this.   Next scheduled BMET: Tuesday, May 25, 2013.   Tyrone NineAlvin B. Artelia Larocheung, PharmD Clinical Pharmacist - Resident Phone: [phone] Pager: [phone] 04/28/2013 4:42 PM

## 2013-05-25 ENCOUNTER — Other Ambulatory Visit: Payer: PRIVATE HEALTH INSURANCE

## 2013-06-01 ENCOUNTER — Other Ambulatory Visit: Payer: PRIVATE HEALTH INSURANCE

## 2013-06-07 ENCOUNTER — Other Ambulatory Visit: Payer: PRIVATE HEALTH INSURANCE

## 2013-06-17 ENCOUNTER — Emergency Department (HOSPITAL_COMMUNITY): Payer: PRIVATE HEALTH INSURANCE

## 2013-06-17 ENCOUNTER — Observation Stay (HOSPITAL_COMMUNITY): Payer: PRIVATE HEALTH INSURANCE

## 2013-06-17 ENCOUNTER — Observation Stay (HOSPITAL_COMMUNITY)
Admission: EM | Admit: 2013-06-17 | Discharge: 2013-06-18 | Disposition: A | Discharge status: Home / Self Care | Payer: PRIVATE HEALTH INSURANCE | Attending: Emergency Medicine | Admitting: Emergency Medicine

## 2013-06-17 ENCOUNTER — Encounter (HOSPITAL_COMMUNITY): Payer: Self-pay | Admitting: Emergency Medicine

## 2013-06-17 DIAGNOSIS — F1410 Cocaine abuse, uncomplicated: Secondary | ICD-10-CM | POA: Diagnosis present

## 2013-06-17 DIAGNOSIS — F411 Generalized anxiety disorder: Secondary | ICD-10-CM | POA: Insufficient documentation

## 2013-06-17 DIAGNOSIS — R5381 Other malaise: Principal | ICD-10-CM | POA: Insufficient documentation

## 2013-06-17 DIAGNOSIS — K219 Gastro-esophageal reflux disease without esophagitis: Secondary | ICD-10-CM | POA: Insufficient documentation

## 2013-06-17 DIAGNOSIS — R9439 Abnormal result of other cardiovascular function study: Secondary | ICD-10-CM

## 2013-06-17 DIAGNOSIS — I214 Non-ST elevation (NSTEMI) myocardial infarction: Secondary | ICD-10-CM

## 2013-06-17 DIAGNOSIS — R5383 Other fatigue: Principal | ICD-10-CM

## 2013-06-17 DIAGNOSIS — I2510 Atherosclerotic heart disease of native coronary artery without angina pectoris: Secondary | ICD-10-CM

## 2013-06-17 DIAGNOSIS — I252 Old myocardial infarction: Secondary | ICD-10-CM | POA: Insufficient documentation

## 2013-06-17 DIAGNOSIS — M129 Arthropathy, unspecified: Secondary | ICD-10-CM | POA: Insufficient documentation

## 2013-06-17 DIAGNOSIS — Z8673 Personal history of transient ischemic attack (TIA), and cerebral infarction without residual deficits: Secondary | ICD-10-CM | POA: Insufficient documentation

## 2013-06-17 DIAGNOSIS — F17200 Nicotine dependence, unspecified, uncomplicated: Secondary | ICD-10-CM | POA: Insufficient documentation

## 2013-06-17 DIAGNOSIS — M545 Low back pain, unspecified: Secondary | ICD-10-CM | POA: Insufficient documentation

## 2013-06-17 DIAGNOSIS — G43909 Migraine, unspecified, not intractable, without status migrainosus: Secondary | ICD-10-CM | POA: Insufficient documentation

## 2013-06-17 DIAGNOSIS — F1910 Other psychoactive substance abuse, uncomplicated: Secondary | ICD-10-CM | POA: Insufficient documentation

## 2013-06-17 DIAGNOSIS — Z880 Allergy status to penicillin: Secondary | ICD-10-CM | POA: Insufficient documentation

## 2013-06-17 DIAGNOSIS — I200 Unstable angina: Secondary | ICD-10-CM

## 2013-06-17 DIAGNOSIS — R0789 Other chest pain: Secondary | ICD-10-CM

## 2013-06-17 DIAGNOSIS — I2589 Other forms of chronic ischemic heart disease: Secondary | ICD-10-CM | POA: Insufficient documentation

## 2013-06-17 DIAGNOSIS — F419 Anxiety disorder, unspecified: Secondary | ICD-10-CM

## 2013-06-17 DIAGNOSIS — G459 Transient cerebral ischemic attack, unspecified: Secondary | ICD-10-CM | POA: Diagnosis present

## 2013-06-17 DIAGNOSIS — Z720 Tobacco use: Secondary | ICD-10-CM | POA: Diagnosis present

## 2013-06-17 DIAGNOSIS — G8194 Hemiplegia, unspecified affecting left nondominant side: Secondary | ICD-10-CM

## 2013-06-17 DIAGNOSIS — Z7902 Long term (current) use of antithrombotics/antiplatelets: Secondary | ICD-10-CM | POA: Insufficient documentation

## 2013-06-17 DIAGNOSIS — Z79899 Other long term (current) drug therapy: Secondary | ICD-10-CM | POA: Insufficient documentation

## 2013-06-17 DIAGNOSIS — G8929 Other chronic pain: Secondary | ICD-10-CM | POA: Insufficient documentation

## 2013-06-17 DIAGNOSIS — Z7982 Long term (current) use of aspirin: Secondary | ICD-10-CM | POA: Insufficient documentation

## 2013-06-17 DIAGNOSIS — R0601 Orthopnea: Secondary | ICD-10-CM | POA: Insufficient documentation

## 2013-06-17 DIAGNOSIS — E785 Hyperlipidemia, unspecified: Secondary | ICD-10-CM

## 2013-06-17 DIAGNOSIS — I6350 Cerebral infarction due to unspecified occlusion or stenosis of unspecified cerebral artery: Secondary | ICD-10-CM

## 2013-06-17 DIAGNOSIS — I255 Ischemic cardiomyopathy: Secondary | ICD-10-CM

## 2013-06-17 DIAGNOSIS — I639 Cerebral infarction, unspecified: Secondary | ICD-10-CM | POA: Diagnosis present

## 2013-06-17 DIAGNOSIS — M549 Dorsalgia, unspecified: Secondary | ICD-10-CM

## 2013-06-17 DIAGNOSIS — R209 Unspecified disturbances of skin sensation: Secondary | ICD-10-CM

## 2013-06-17 DIAGNOSIS — I10 Essential (primary) hypertension: Secondary | ICD-10-CM | POA: Insufficient documentation

## 2013-06-17 LAB — APTT: aPTT: 28 seconds (ref 24–37)

## 2013-06-17 LAB — CREATININE, SERUM
Creatinine, Ser: 1.11 mg/dL (ref 0.50–1.35)
GFR, EST AFRICAN AMERICAN: 88 mL/min — AB (ref >90)
GFR, EST NON AFRICAN AMERICAN: 76 mL/min — AB (ref >90)

## 2013-06-17 LAB — COMPREHENSIVE METABOLIC PANEL
ALBUMIN: 3.4 g/dL — AB (ref 3.5–5.2)
ALT: 16 U/L (ref 0–53)
AST: 17 U/L (ref 0–37)
Alkaline Phosphatase: 51 U/L (ref 39–117)
BUN: 15 mg/dL (ref 6–23)
CO2: 24 mEq/L (ref 19–32)
CREATININE: 1 mg/dL (ref 0.50–1.35)
Calcium: 8.8 mg/dL (ref 8.4–10.5)
Chloride: 102 mEq/L (ref 96–112)
GFR calc Af Amer: >90 mL/min (ref >90)
GFR calc non Af Amer: 86 mL/min — ABNORMAL LOW (ref >90)
Glucose, Bld: 93 mg/dL (ref 70–99)
Potassium: 3.7 mEq/L (ref 3.7–5.3)
Sodium: 138 mEq/L (ref 137–147)
Total Bilirubin: <0.2 mg/dL — ABNORMAL LOW (ref 0.3–1.2)
Total Protein: 6.8 g/dL (ref 6.0–8.3)

## 2013-06-17 LAB — URINALYSIS, ROUTINE W REFLEX MICROSCOPIC
BILIRUBIN URINE: NEGATIVE
Glucose, UA: NEGATIVE mg/dL
Hgb urine dipstick: NEGATIVE
KETONES UR: NEGATIVE mg/dL
Leukocytes, UA: NEGATIVE
NITRITE: NEGATIVE
Protein, ur: NEGATIVE mg/dL
Specific Gravity, Urine: 1.019 (ref 1.005–1.030)
UROBILINOGEN UA: 0.2 mg/dL (ref 0.0–1.0)
pH: 5 (ref 5.0–8.0)

## 2013-06-17 LAB — DIFFERENTIAL
BASOS PCT: 0 % (ref 0–1)
Basophils Absolute: 0 10*3/uL (ref 0.0–0.1)
EOS ABS: 0.2 10*3/uL (ref 0.0–0.7)
Eosinophils Relative: 2 % (ref 0–5)
Lymphocytes Relative: 36 % (ref 12–46)
Lymphs Abs: 3.2 10*3/uL (ref 0.7–4.0)
MONO ABS: 1.1 10*3/uL — AB (ref 0.1–1.0)
Monocytes Relative: 13 % — ABNORMAL HIGH (ref 3–12)
Neutro Abs: 4.3 10*3/uL (ref 1.7–7.7)
Neutrophils Relative %: 49 % (ref 43–77)

## 2013-06-17 LAB — PROTIME-INR
INR: 0.88 (ref 0.00–1.49)
Prothrombin Time: 11.8 seconds (ref 11.6–15.2)

## 2013-06-17 LAB — CBC
HEMATOCRIT: 35.1 % — AB (ref 39.0–52.0)
HEMATOCRIT: 37.1 % — AB (ref 39.0–52.0)
HEMOGLOBIN: 11.4 g/dL — AB (ref 13.0–17.0)
HEMOGLOBIN: 11.9 g/dL — AB (ref 13.0–17.0)
MCH: 29.3 pg (ref 26.0–34.0)
MCH: 29.1 pg (ref 26.0–34.0)
MCHC: 32.5 g/dL (ref 30.0–36.0)
MCHC: 32.1 g/dL (ref 30.0–36.0)
MCV: 90.2 fL (ref 78.0–100.0)
MCV: 90.7 fL (ref 78.0–100.0)
Platelets: 284 10*3/uL (ref 150–400)
Platelets: 285 10*3/uL (ref 150–400)
RBC: 3.89 MIL/uL — ABNORMAL LOW (ref 4.22–5.81)
RBC: 4.09 MIL/uL — AB (ref 4.22–5.81)
RDW: 14.2 % (ref 11.5–15.5)
RDW: 14.2 % (ref 11.5–15.5)
WBC: 8.9 10*3/uL (ref 4.0–10.5)
WBC: 7.9 10*3/uL (ref 4.0–10.5)

## 2013-06-17 LAB — CBG MONITORING, ED: Glucose-Capillary: 97 mg/dL (ref 70–99)

## 2013-06-17 LAB — RAPID URINE DRUG SCREEN, HOSP PERFORMED
Amphetamines: NOT DETECTED
BARBITURATES: NOT DETECTED
Benzodiazepines: NOT DETECTED
Cocaine: NOT DETECTED
Opiates: POSITIVE — AB
Tetrahydrocannabinol: NOT DETECTED

## 2013-06-17 LAB — TROPONIN I

## 2013-06-17 LAB — I-STAT TROPONIN, ED: TROPONIN I, POC: 0 ng/mL (ref 0.00–0.08)

## 2013-06-17 MED ORDER — LOSARTAN POTASSIUM 50 MG PO TABS
100.0000 mg | ORAL_TABLET | Freq: Every day | ORAL | Status: DC
  Administered 2013-06-17 – 2013-06-18 (×2): 100 mg via ORAL
  Filled 2013-06-17 (×2): qty 2

## 2013-06-17 MED ORDER — HYDROCHLOROTHIAZIDE 25 MG PO TABS
25.0000 mg | ORAL_TABLET | Freq: Every day | ORAL | Status: DC
  Administered 2013-06-17 – 2013-06-18 (×2): 25 mg via ORAL
  Filled 2013-06-17 (×2): qty 1

## 2013-06-17 MED ORDER — FENTANYL CITRATE 0.05 MG/ML IJ SOLN
100.0000 ug | Freq: Once | INTRAMUSCULAR | Status: AC
  Administered 2013-06-17: 100 ug via INTRAVENOUS
  Filled 2013-06-17: qty 2

## 2013-06-17 MED ORDER — LEVOTHYROXINE SODIUM 50 MCG PO TABS
50.0000 ug | ORAL_TABLET | Freq: Every day | ORAL | Status: DC
  Administered 2013-06-17 – 2013-06-18 (×2): 50 ug via ORAL
  Filled 2013-06-17 (×3): qty 1

## 2013-06-17 MED ORDER — SPIRONOLACTONE 25 MG PO TABS
25.0000 mg | ORAL_TABLET | Freq: Every day | ORAL | Status: DC
  Administered 2013-06-17 – 2013-06-18 (×2): 25 mg via ORAL
  Filled 2013-06-17 (×2): qty 1

## 2013-06-17 MED ORDER — ATORVASTATIN CALCIUM 80 MG PO TABS
80.0000 mg | ORAL_TABLET | Freq: Every day | ORAL | Status: DC
  Administered 2013-06-17: 80 mg via ORAL
  Filled 2013-06-17 (×2): qty 1

## 2013-06-17 MED ORDER — PANTOPRAZOLE SODIUM 40 MG PO TBEC
40.0000 mg | DELAYED_RELEASE_TABLET | Freq: Every day | ORAL | Status: DC
  Administered 2013-06-17 – 2013-06-18 (×2): 40 mg via ORAL
  Filled 2013-06-17 (×2): qty 1

## 2013-06-17 MED ORDER — NITROGLYCERIN 0.4 MG SL SUBL: 0.4000 mg | SUBLINGUAL_TABLET | SUBLINGUAL | Status: DC | PRN

## 2013-06-17 MED ORDER — LORAZEPAM 0.5 MG PO TABS
0.5000 mg | ORAL_TABLET | Freq: Once | ORAL | Status: AC
  Administered 2013-06-17: 0.5 mg via ORAL
  Filled 2013-06-17: qty 1

## 2013-06-17 MED ORDER — HYDROMORPHONE HCL PF 1 MG/ML IJ SOLN
1.0000 mg | INTRAMUSCULAR | Status: DC | PRN
  Administered 2013-06-17 – 2013-06-18 (×4): 1 mg via INTRAVENOUS
  Filled 2013-06-17 (×4): qty 1

## 2013-06-17 MED ORDER — ONDANSETRON HCL 4 MG PO TABS: 4.0000 mg | ORAL_TABLET | Freq: Four times a day (QID) | ORAL | Status: DC | PRN

## 2013-06-17 MED ORDER — OXYCODONE-ACETAMINOPHEN 5-325 MG PO TABS
1.0000 | ORAL_TABLET | ORAL | Status: DC | PRN
  Administered 2013-06-17 – 2013-06-18 (×5): 2 via ORAL
  Filled 2013-06-17 (×5): qty 2

## 2013-06-17 MED ORDER — LOSARTAN POTASSIUM-HCTZ 100-25 MG PO TABS: 1.0000 | ORAL_TABLET | Freq: Every day | ORAL | Status: DC

## 2013-06-17 MED ORDER — HEPARIN SODIUM (PORCINE) 5000 UNIT/ML IJ SOLN
5000.0000 [IU] | Freq: Three times a day (TID) | INTRAMUSCULAR | Status: DC
  Filled 2013-06-17 (×6): qty 1

## 2013-06-17 MED ORDER — NICOTINE 14 MG/24HR TD PT24
14.0000 mg | MEDICATED_PATCH | TRANSDERMAL | Status: DC
  Filled 2013-06-17 (×2): qty 1

## 2013-06-17 MED ORDER — BUPROPION HCL 100 MG PO TABS
100.0000 mg | ORAL_TABLET | Freq: Two times a day (BID) | ORAL | Status: DC
  Administered 2013-06-17 – 2013-06-18 (×3): 100 mg via ORAL
  Filled 2013-06-17 (×5): qty 1

## 2013-06-17 MED ORDER — METOPROLOL TARTRATE 100 MG PO TABS
100.0000 mg | ORAL_TABLET | Freq: Two times a day (BID) | ORAL | Status: DC
  Administered 2013-06-17 – 2013-06-18 (×3): 100 mg via ORAL
  Filled 2013-06-17 (×4): qty 1

## 2013-06-17 MED ORDER — CLONIDINE HCL 0.1 MG PO TABS
0.1000 mg | ORAL_TABLET | Freq: Two times a day (BID) | ORAL | Status: DC
  Administered 2013-06-17 – 2013-06-18 (×3): 0.1 mg via ORAL
  Filled 2013-06-17 (×4): qty 1

## 2013-06-17 MED ORDER — ASPIRIN 81 MG PO CHEW
81.0000 mg | CHEWABLE_TABLET | Freq: Every day | ORAL | Status: DC
  Administered 2013-06-18: 81 mg via ORAL
  Filled 2013-06-17: qty 1

## 2013-06-17 MED ORDER — CLOPIDOGREL BISULFATE 75 MG PO TABS
75.0000 mg | ORAL_TABLET | Freq: Every day | ORAL | Status: DC
  Administered 2013-06-18: 75 mg via ORAL
  Filled 2013-06-17: qty 1

## 2013-06-17 MED ORDER — SODIUM CHLORIDE 0.9 % IJ SOLN
3.0000 mL | Freq: Two times a day (BID) | INTRAMUSCULAR | Status: DC
  Administered 2013-06-17 – 2013-06-18 (×3): 3 mL via INTRAVENOUS

## 2013-06-17 MED ORDER — ACETAMINOPHEN 650 MG RE SUPP: 650.0000 mg | Freq: Four times a day (QID) | RECTAL | Status: DC | PRN

## 2013-06-17 MED ORDER — ONDANSETRON HCL 4 MG/2ML IJ SOLN
4.0000 mg | Freq: Four times a day (QID) | INTRAMUSCULAR | Status: DC | PRN
  Administered 2013-06-17: 4 mg via INTRAVENOUS
  Filled 2013-06-17: qty 2

## 2013-06-17 MED ORDER — ACETAMINOPHEN 325 MG PO TABS
650.0000 mg | ORAL_TABLET | Freq: Four times a day (QID) | ORAL | Status: DC | PRN
  Administered 2013-06-17: 650 mg via ORAL
  Filled 2013-06-17: qty 2
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
## 2013-06-17 NOTE — Evaluation (Signed)
Occupational Therapy Evaluation Patient Details Name: Austin Simmons MRN: [id number] DOB: 10/24/1960 Today's Date: 06/17/2013    History of Present Illness 53 yo male admitted with LT side weakness, CT (-)  pending MRI workup   Clinical Impression   Patient evaluated by Occupational Therapy with no further acute OT needs identified. All education has been completed and the patient has no further questions. See below for any follow-up Occupational Therapy or equipment needs. OT to sign off. Thank you for referral.      Follow Up Recommendations  No OT follow up    Equipment Recommendations  None recommended by OT    Recommendations for Other Services       Precautions / Restrictions Precautions Precautions: None      Mobility Bed Mobility Overal bed mobility: Modified Independent                Transfers Overall transfer level: Modified independent                    Balance                                            ADL Overall ADL's : Modified independent                                       General ADL Comments: Pt demonstrates single leg standing on LT LE to adjust Rt sock while ambulating in RW. Pt ambulated > 200 ft with RW mod I. pt changing topics of discussion rapidly and unrelated to curent questions from OT. Pt began a discussion about how therapist should only adopt children and not having any children of their own. this was inappropriate and unrelated. Pt cued to this fact and pt continued discussion unaware of error. Question patients baseline cognition     Vision                     Perception     Praxis      Pertinent Vitals/Pain VSS Premedicated no pain reported     Hand Dominance Right   Extremity/Trunk Assessment Upper Extremity Assessment Upper Extremity Assessment: LUE deficits/detail LUE Deficits / Details: very inconsistent in testing, OT met resistance with testing. pt was  unable to hold UE against gravity but was able to use LT UE to support entire body to adjust sock with RT UE, pt was able to move LT UE without any motor planning deficits noted   Lower Extremity Assessment Lower Extremity Assessment: Defer to PT evaluation (inconsistent)   Cervical / Trunk Assessment Cervical / Trunk Assessment: Normal   Communication Communication Communication: Expressive difficulties (slurred speech from previous CVA)   Cognition Arousal/Alertness: Awake/alert Behavior During Therapy: WFL for tasks assessed/performed Overall Cognitive Status: History of cognitive impairments - at baseline (no family present currently)                     General Comments       Exercises       Shoulder Instructions      Home Living Family/patient expects to be discharged to:: Private residence Living Arrangements: Children Available Help at Discharge: Family Type of Home: Mobile home       Home Layout: One level  Bathroom Shower/Tub: Occupational psychologist: Standard     Home Equipment: Environmental consultant - 2 wheels;Shower seat;Bedside commode;Hand held shower head;Grab bars - tub/shower          Prior Functioning/Environment Level of Independence: Independent             OT Diagnosis:     OT Problem List:     OT Treatment/Interventions:      OT Goals(Current goals can be found in the care plan section)    OT Frequency:     Barriers to D/C:            Co-evaluation              End of Session Nurse Communication: Mobility status  Activity Tolerance: Patient tolerated treatment well Patient left: in bed;with call bell/phone within reach   Time: 1132-1148 OT Time Calculation (min): 16 min Charges:  OT General Charges $OT Visit: 1 Procedure OT Evaluation $Initial OT Evaluation Tier I: 1 Procedure OT Treatments $Self Care/Home Management : 8-22 mins G-Codes: OT G-codes **NOT FOR INPATIENT CLASS** Functional Assessment  Tool Used: clincial judgemetn Functional Limitation: Self care Self Care Current Status (W0981): 0 percent impaired, limited or restricted Self Care Goal Status (X9147): 0 percent impaired, limited or restricted Self Care Discharge Status (W2956): 0 percent impaired, limited or restricted  Peri Maris 06/17/2013, 12:02 PM Pager: [phone]

## 2013-06-17 NOTE — Progress Notes (Signed)
OT Cancellation Note  Patient Details Name: Jeanclaude Quade MRN: [id number] DOB: 28-Sep-1960   Cancelled Treatment:    Reason Eval/Treat Not Completed: Medical issues which prohibited therapy (bedrest 12 hours started at 6AM 06/17/13)  Harolyn Rutherford Pager: [phone]  06/17/2013, 10:45 AM

## 2013-06-17 NOTE — ED Notes (Signed)
Pt wanting to stand while urinating. This RN informed patient he needs to stay in bed but patient non compliant. Pt able to stand with steady gait with his weight applied to both lower extremities with this RN at bedside.

## 2013-06-17 NOTE — ED Notes (Signed)
Report attempted 
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
## 2013-06-17 NOTE — ED Notes (Signed)
Report given to 3 Chad RN-- Room has not been cleaned, stat clean order placed.

## 2013-06-17 NOTE — ED Notes (Signed)
Dr. Molpus at bedside. 

## 2013-06-17 NOTE — ED Notes (Signed)
Per EMS pt reports he first noticed his left side weakness yesterday 5/27 at 1300. Pt reports hx of TIAs, last one 2-3 mo ago, pt deniess seeking medical tx. Pt reports he was left with left sided deficits however tonight reports increased weakness on the left side. Pt presents with left sided facial droop and states his left side is weaker than normal, facial droop is a new assessment finding today. Pt has a hx of HTN. Pt reports he his nauseous. No arm drift present, grip strength equal bilaterally. MD at [facility] upon arrival to department.

## 2013-06-17 NOTE — Progress Notes (Signed)
*  PRELIMINARY RESULTS* Vascular Ultrasound Carotid Duplex (Doppler) has been completed.   Findings suggest 1-39% internal carotid artery stenosis bilaterally. Vertebral arteries are patent with antegrade flow.  06/17/2013 4:30 PM Gertie Fey, RVT, RDCS, RDMS

## 2013-06-18 DIAGNOSIS — F17200 Nicotine dependence, unspecified, uncomplicated: Secondary | ICD-10-CM

## 2013-06-18 DIAGNOSIS — I517 Cardiomegaly: Secondary | ICD-10-CM

## 2013-06-18 DIAGNOSIS — G8190 Hemiplegia, unspecified affecting unspecified side: Secondary | ICD-10-CM

## 2013-06-18 DIAGNOSIS — R209 Unspecified disturbances of skin sensation: Secondary | ICD-10-CM

## 2013-06-18 DIAGNOSIS — G8194 Hemiplegia, unspecified affecting left nondominant side: Secondary | ICD-10-CM

## 2013-06-18 LAB — CBC
HCT: 38.8 % — ABNORMAL LOW (ref 39.0–52.0)
Hemoglobin: 12.3 g/dL — ABNORMAL LOW (ref 13.0–17.0)
MCH: 28.9 pg (ref 26.0–34.0)
MCHC: 31.7 g/dL (ref 30.0–36.0)
MCV: 91.3 fL (ref 78.0–100.0)
Platelets: 297 10*3/uL (ref 150–400)
RBC: 4.25 MIL/uL (ref 4.22–5.81)
RDW: 14.2 % (ref 11.5–15.5)
WBC: 8.8 10*3/uL (ref 4.0–10.5)

## 2013-06-18 LAB — COMPREHENSIVE METABOLIC PANEL
ALK PHOS: 56 U/L (ref 39–117)
ALT: 17 U/L (ref 0–53)
AST: 17 U/L (ref 0–37)
Albumin: 3.6 g/dL (ref 3.5–5.2)
BILIRUBIN TOTAL: 0.2 mg/dL — AB (ref 0.3–1.2)
BUN: 18 mg/dL (ref 6–23)
CHLORIDE: 101 meq/L (ref 96–112)
CO2: 27 meq/L (ref 19–32)
CREATININE: 1.06 mg/dL (ref 0.50–1.35)
Calcium: 9 mg/dL (ref 8.4–10.5)
GFR calc Af Amer: >90 mL/min (ref >90)
GFR, EST NON AFRICAN AMERICAN: 80 mL/min — AB (ref >90)
Glucose, Bld: 114 mg/dL — ABNORMAL HIGH (ref 70–99)
POTASSIUM: 4.2 meq/L (ref 3.7–5.3)
Sodium: 140 mEq/L (ref 137–147)
Total Protein: 7.2 g/dL (ref 6.0–8.3)

## 2013-06-18 LAB — TROPONIN I

## 2013-06-18 LAB — VITAMIN B12: VITAMIN B 12: 425 pg/mL (ref 211–911)

## 2013-06-18 LAB — TSH: TSH: 53.84 u[IU]/mL — ABNORMAL HIGH (ref 0.350–4.500)

## 2013-06-18 MED ORDER — STROKE: EARLY STAGES OF RECOVERY BOOK
Freq: Once | Status: DC
  Filled 2013-06-18: qty 1

## 2013-06-18 NOTE — Progress Notes (Signed)
1234 06-18-13 CM did try to question pt to see if he was having difficulty affording and getting medications. Pt states he has enough med's until Monday. He will get his check on Monday and will be able to get refills. Pt states his med's cost 1.50. CM wanted to try to get pt enrolled in a delivery Pharmacy, however pt declined assistance at this time. CM did make MD aware and Staff RN at bedside during conversation. No further needs at this time. Gala Lewandowsky, RN, BSN [phone]

## 2013-06-18 NOTE — Progress Notes (Signed)
Patient stated he is able to read some and demonstrated that he could read his medications, he also stated he has a medication organizer at home that his friend "Annamaria Helling" (who is a Engineer, civil (consulting) and lives with him) helps set up his medications.  Patient was also able to state some of the signs and symptoms of stroke, further explanation of these symptoms was given to the patient.

## 2013-06-18 NOTE — Discharge Instructions (Signed)
STROKE/TIA DISCHARGE INSTRUCTIONS SMOKING Cigarette smoking nearly doubles your risk of having a stroke & is the single most alterable risk factor  If you smoke or have smoked in the last 12 months, you are advised to quit smoking for your health.  Most of the excess cardiovascular risk related to smoking disappears within a year of stopping.  Ask you doctor about anti-smoking medications  [facility] Quit Line: 1-800-QUIT NOW  Free Smoking Cessation Classes [phone]  CHOLESTEROL Know your levels; limit fat & cholesterol in your diet  Lipid Panel     Component Value Date/Time   CHOL 148 04/19/2013 1503   TRIG 410* 04/19/2013 1503   HDL 29* 04/19/2013 1503   CHOLHDL 5.1 04/19/2013 1503   VLDL NOT CALC 04/19/2013 1503   LDLCALC NOT CALC 04/19/2013 1503      Many patients benefit from treatment even if their cholesterol is at goal.  Goal: Total Cholesterol (CHOL) less than 160  Goal:  Triglycerides (TRIG) less than 150  Goal:  HDL greater than 40  Goal:  LDL (LDLCALC) less than 100   BLOOD PRESSURE American Stroke Association blood pressure target is less that 120/80 mm/Hg  Your discharge blood pressure is:  BP: 159/85 mmHg  Monitor your blood pressure  Limit your salt and alcohol intake  Many individuals will require more than one medication for high blood pressure  DIABETES (A1c is a blood sugar average for last 3 months) Goal HGBA1c is under 7% (HBGA1c is blood sugar average for last 3 months)  Diabetes: No known diagnosis of diabetes    No results found for this basename: HGBA1C     Your HGBA1c can be lowered with medications, healthy diet, and exercise.  Check your blood sugar as directed by your physician  Call your physician if you experience unexplained or low blood sugars.  PHYSICAL ACTIVITY/REHABILITATION Goal is 30 minutes at least 4 days per week  Activity: No restrictions. Therapies:  Return to work:   Activity decreases your risk of heart attack and stroke and  makes your heart stronger.  It helps control your weight and blood pressure; helps you relax and can improve your mood.  Participate in a regular exercise program.  Talk with your doctor about the best form of exercise for you (dancing, walking, swimming, cycling).  DIET/WEIGHT Goal is to maintain a healthy weight  Your discharge diet is: General Regular liquids Your height is:  Height: 5\' 6"  (167.6 cm) Your current weight is: Weight: 88.86 kg (195 lb 14.4 oz) Your Body Mass Index (BMI) is:  BMI (Calculated): 30.4  Following the type of diet specifically designed for you will help prevent another stroke.  Your goal weight range is: 118 - 149  Your goal Body Mass Index (BMI) is 19-24.  Healthy food habits can help reduce 3 risk factors for stroke:  High cholesterol, hypertension, and excess weight.  RESOURCES Stroke/Support Group:  Call [phone]   STROKE EDUCATION PROVIDED/REVIEWED AND GIVEN TO PATIENT Stroke warning signs and symptoms How to activate emergency medical system (call 911). Medications prescribed at discharge. Need for follow-up after discharge. Personal risk factors for stroke. Pneumonia vaccine given: No Flu vaccine given: No My questions have been answered, the writing is legible, and I understand these instructions.  I will adhere to these goals & educational materials that have been provided to me after my discharge from the hospital.   Stroke Prevention Some health problems and behaviors may make it more likely for you to  have a stroke. Below are ways to lessen your risk of having a stroke.   Be active for at least 30 minutes on most or all days.  Do not smoke. Try not to be around others who smoke.  Do not drink too much alcohol.  Do not have more than 2 drinks a day if you are a man.  Do not have more than 1 drink a day if you are a woman and are not pregnant.  Eat healthy foods, such as fruits and vegetables. If you were put on a specific diet,  follow the diet as told.  Keep your cholesterol levels under control through diet and medicines. Look for foods that are low in saturated fat, trans fat, cholesterol, and are high in fiber.  If you have diabetes, follow all diet plans and take your medicine as told.  If you have high blood pressure (hypertension), follow all diet plans and take your medicine as told.  Keep a healthy weight. Eat foods that are low in calories, salt, saturated fat, trans fat, and cholesterol.  Do not take drugs.  Avoid birth control pills, if this applies. Talk to your doctor about the risks of taking birth control pills.  Talk to your doctor if you have sleep problems (sleep apnea).  Take all medicine as told by your doctor.  You may be told to take aspirin or blood thinner medicine. Take this medicine as told by your doctor.  Understand your medicine instructions.  Make sure any other conditions you have are being taken care of. GET HELP RIGHT AWAY IF:  You suddenly lose feeling (you feel numb) or have weakness in your face, arm, or leg.  Your face or eyelid hangs down to one side.  You suddenly feel confused.  You have trouble talking (aphasia) or understanding what people are saying.  You suddenly have trouble seeing in one or both eyes.  You suddenly have trouble walking.  You are dizzy.  You lose your balance or your movements are clumsy (uncoordinated).  You suddenly have a very bad headache and you do not know the cause.  You have new chest pain.  Your heart feels like it is fluttering or skipping a beat (irregular heartbeat). Do not wait to see if the symptoms above go away. Get help right away. Call your local emergency services (911 in [place] Do not drive yourself to the hospital. Document Released: 07/09/2011 Document Revised: 10/28/2012 Document Reviewed: 07/10/2012 [facility] Patient Information 2014 [employer], [place].

## 2013-06-18 NOTE — Discharge Summary (Signed)
Physician Discharge Summary  Austin Simmons QZR:[id number] DOB: [birth year] DOA: 06/17/2013  PCP: Doris Cheadle, MD  Admit date: 06/17/2013 Discharge date: 06/18/2013  Recommendations for Outpatient Follow-up:  1. Pt will need to follow up with PCP in 2 weeks post discharge 2. Please obtain BMP to evaluate electrolytes and kidney function 3. Please also check CBC to evaluate Hg and Hct levels 4. Check TSH in 4-6 week   Discharge Diagnoses:   sensory disturbance/left hemiparesis -There was initial concern for stroke -MRI brain negative for any acute findings -MRI cervical spine mild cervical spondylosis with very mild C5-C6 central stenosis without any cord deformity -MRI lumbar spine showed mild DJD L4-L5 without any central canal narrowing -TSH 53.4 -Urine drug screen positive for opiates -It was felt that the patient's sensory disturbance and weakness may be attributed to the patient's hypothyroidism -Initially, the patient endorses compliance to all his medications -However, after the patient was confronted that none of his prescription medications have been picked up at his pharmacy, the patient subsequently admitted that he had not been taking his medications do to financial constraints -Case management was asked to assist the patient with medications--there are try to set the patient up with a home delivery service for the patient -The patient stated that he was on Medicaid and does get a disability check  -Serum B12 and RBC folate are pending at the time of discharge. Please followup on these results  -Compliance with medications was stressed and discussed with the patient  -The patient was evaluated by physical therapy who did not feel that the patient needed any ongoing therapy -Carotid duplex negative for hemodynamically significant stenosis -Echocardiogram was performed and results are pending at this time. Please follow up on these results -The patient will continue aspirin  81 mg daily--he was not taking meds properly prior to admission Hypertension  -Again, the patient has been noncompliant with his medications as discussed above  -Compliance was discussed with the patient  -His blood pressure was under good control during the hospitalization  Vitamin D deficiency  -04/19/2013--25 vitamin D level  = 24  -The patient will be started on vitamin D 50,000 units once per week  Hyperlipidemia  -Continue Lipitor  Coronary artery disease -Chest pain Reproducible on exam -Troponins negative x3 -EKG shows sinus rhythm with early repolarization  Coronary artery disease  -Status post heart catheterization with bare-metal stent to LAD 08/13/2012  -The patient was to continue aspirin indefinitely with Plavix for one month after the stent was placed  -He will continue on aspirin 81 mg daily  Subcutaneous cyst left axilla  -No signs of infection  -Outpatient followup  Discharge Condition: stable  Disposition: home  Diet:heart healthy Wt Readings from Last 3 Encounters:  06/18/13 88.86 kg (195 lb 14.4 oz)  04/27/13 76.204 kg (168 lb)  04/19/13 89.812 kg (198 lb)    History of present illness:  53 y.o. male  With a hx of htn, chronic pain, documented polysubstance abuse hx, documented migraine ha, cad, prior CVA, cardiomyopathy who presents to the ED with L sided weakness and numbness that started at around 1300 on 5/27. Pt has chronic back pain and is followed by pain mgt. Reports hx of "bad disc" disease. Pt acknowledges old slurred speech from CVA over 72yrs ago. Presently, pt continues to complain of on-going L sided weakness, albeit improved, but not resolved. Also notes L sided chest pain that is tender on palpation. Otherwise denies sob. In the Ed, CT head was found  to be unremarkable. Hospitalist consulted for consideration of admission for cva/tia w/u.     Discharge Exam: Filed Vitals:   06/18/13 1100  BP:   Pulse: 63  Temp:   Resp:    Filed  Vitals:   06/18/13 0457 06/18/13 0814 06/18/13 1057 06/18/13 1100  BP: 135/87 133/87 159/85   Pulse: 60 64  63  Temp: 98.2 F (36.8 C) 97.8 F (36.6 C)    TempSrc: Oral Oral    Resp: 18 16    Height:      Weight: 88.86 kg (195 lb 14.4 oz)     SpO2: 98% 99%     General: A&O x 3, NAD, pleasant, cooperative Cardiovascular: RRR, no rub, no gallop, no S3 Respiratory: CTAB, no wheeze, no rhonchi Abdomen:soft, nontender, nondistended, positive bowel sounds Extremities: No edema, No lymphangitis, no petechiae; left axilla with 5 mm subcutaneous cyst without any erythema, crepitance, lymphangitis   Discharge Instructions      Discharge Instructions   Diet - low sodium heart healthy    Complete by:  As directed      Increase activity slowly    Complete by:  As directed             Medication List    STOP taking these medications       clopidogrel 75 MG tablet  Commonly known as:  PLAVIX      TAKE these medications       acetaminophen-codeine 300-15 MG per tablet  Commonly known as:  TYLENOL #2  Take 1 tablet by mouth every 4 (four) hours as needed for moderate pain.     aspirin 81 MG chewable tablet  Chew 81 mg by mouth daily.     atorvastatin 80 MG tablet  Commonly known as:  LIPITOR  Take 1 tablet (80 mg total) by mouth daily at 6 PM.     buPROPion 100 MG tablet  Commonly known as:  WELLBUTRIN  Take 1 tablet (100 mg total) by mouth 2 (two) times daily.     cloNIDine 0.1 MG tablet  Commonly known as:  CATAPRES  Take 0.1 mg by mouth 2 (two) times daily.     levothyroxine 50 MCG tablet  Commonly known as:  SYNTHROID, LEVOTHROID  Take 1 tablet (50 mcg total) by mouth daily.     losartan-hydrochlorothiazide 100-25 MG per tablet  Commonly known as:  HYZAAR  Take 1 tablet by mouth daily.     metoprolol 100 MG tablet  Commonly known as:  LOPRESSOR  Take 1 tablet (100 mg total) by mouth 2 (two) times daily.     nitroGLYCERIN 0.4 MG SL tablet  Commonly known  as:  NITROSTAT  Place 1 tablet (0.4 mg total) under the tongue every 5 (five) minutes as needed. Chest pain     pantoprazole 40 MG tablet  Commonly known as:  PROTONIX  Take 1 tablet (40 mg total) by mouth daily.     spironolactone 25 MG tablet  Commonly known as:  ALDACTONE  Take 1 tablet (25 mg total) by mouth daily.         The results of significant diagnostics from this hospitalization (including imaging, microbiology, ancillary and laboratory) are listed below for reference.    Significant Diagnostic Studies: Ct Head (brain) Wo Contrast  06/17/2013   CLINICAL DATA:  Left-sided weakness and slurred speech. Severe frontal headache.  EXAM: CT HEAD WITHOUT CONTRAST  TECHNIQUE: Contiguous axial images were obtained from the base of the  skull through the vertex without intravenous contrast.  COMPARISON:  CT of the head performed 03/17/2012  FINDINGS: There is no evidence of acute infarction, mass lesion, or intra- or extra-axial hemorrhage on CT.  The posterior fossa, including the cerebellum, brainstem and fourth ventricle, is within normal limits. The third and lateral ventricles, and basal ganglia are unremarkable in appearance. The cerebral hemispheres are symmetric in appearance, with normal gray-white differentiation. No mass effect or midline shift is seen.  There is no evidence of fracture; visualized osseous structures are unremarkable in appearance. The orbits are within normal limits. There is partial opacification of the maxillary sinuses bilaterally. The remaining paranasal sinuses and mastoid air cells are well-aerated. No significant soft tissue abnormalities are seen.  IMPRESSION: 1. No acute intracranial pathology seen on CT. 2. Partial opacification of the maxillary sinuses bilaterally.   Electronically Signed   By: Roanna Raider M.D.   On: 06/17/2013 06:19   Mr Brain Wo Contrast  06/17/2013   CLINICAL DATA:  53 year old male with left side weakness and numbness beginning at  1300 hrs. Initial encounter.  EXAM: MRI HEAD WITHOUT CONTRAST  TECHNIQUE: Multiplanar, multiecho pulse sequences of the brain and surrounding structures were obtained without intravenous contrast.  COMPARISON:  Head CT 0602 hr the same day and earlier.  FINDINGS: Cerebral volume is normal. No restricted diffusion to suggest acute infarction. No midline shift, mass effect, evidence of mass lesion, ventriculomegaly, extra-axial collection or acute intracranial hemorrhage. Cervicomedullary junction and pituitary are within normal limits. Negative visualized cervical spine. Major intracranial vascular flow voids are preserved.  Wallace Cullens and white matter signal is within normal limits for age throughout the brain.  Grossly normal visualized internal auditory structures. Mastoids are clear. Moderate bilateral maxillary sinus mucosal thickening and inflammatory changes. Mild paranasal sinus mucosal thickening elsewhere. Visualized orbit soft tissues are within normal limits. Normal bone marrow signal. Visualized scalp soft tissues are within normal limits.  IMPRESSION: No acute intracranial abnormality. Negative non contrast MRI appearance of the brain.   Electronically Signed   By: Augusto Gamble M.D.   On: 06/17/2013 15:25   Mr Cervical Spine Wo Contrast  06/17/2013   CLINICAL DATA:  Chronic pain. Polysubstance abuse. Left-sided weakness and numbness.  EXAM: MRI CERVICAL SPINE WITHOUT CONTRAST  TECHNIQUE: Multiplanar, multisequence MR imaging of the cervical spine was performed. No intravenous contrast was administered.  COMPARISON:  None.  FINDINGS: There is loss of the normal cervical lordosis. No epidural fluid collections. Prevertebral soft tissues appear within normal limits. Intracranial assessment is deferred to brain MRI. Cervical cord demonstrates normal caliber and signal. There is partially visualized maxillary sinus disease. Flow voids are present in both vertebral arteries.  Cervical intervertebral disks appear  normal aside from C5-C6 which shows a shallow right paracentral protrusion producing very mild central stenosis. This narrows the ventral subarachnoid space but is does not deform the cervical cord. The neural foramina are patent and there is no left-sided foraminal stenosis in this patient with an indication of left upper extremity radiculopathy.  IMPRESSION: Minimal cervical spondylosis with shallow right paracentral protrusion at C5-C6 but no cord deformity. Very mild C5-C6 central stenosis associated with the protrusion.   Electronically Signed   By: Andreas Newport M.D.   On: 06/17/2013 14:42   Mr Lumbar Spine Wo Contrast  06/17/2013   CLINICAL DATA:  Back pain.  Left side weakness.  EXAM: MRI LUMBAR SPINE WITHOUT CONTRAST  TECHNIQUE: Multiplanar, multisequence MR imaging of the lumbar spine was performed.  No intravenous contrast was administered.  COMPARISON:  CT abdomen and pelvis 10/28/2012.  FINDINGS: Vertebral body height, signal and alignment are normal. The conus medullaris is normal in signal and position. Imaged intra-abdominal contents are unremarkable.  The T10-11 to L3-4 levels are negative.  L4-5: There is a very shallow central protrusion. The central canal and foramina are widely patent.  L5-S1: Shallow central protrusion without central canal or foraminal stenosis.  IMPRESSION: Mild degenerative disease L4-5 and L5-S1 without central canal or foraminal narrowing.   Electronically Signed   By: Drusilla Kanner M.D.   On: 06/17/2013 14:36     Microbiology: No results found for this or any previous visit (from the past 240 hour(s)).   Labs: Basic Metabolic Panel:  Recent Labs Lab 06/17/13 0504 06/17/13 1115 06/18/13 0705  NA 138  --  140  K 3.7  --  4.2  CL 102  --  101  CO2 24  --  27  GLUCOSE 93  --  114*  BUN 15  --  18  CREATININE 1.00 1.11 1.06  CALCIUM 8.8  --  9.0   Liver Function Tests:  Recent Labs Lab 06/17/13 0504 06/18/13 0705  AST 17 17  ALT 16 17    ALKPHOS 51 56  BILITOT <0.2* 0.2*  PROT 6.8 7.2  ALBUMIN 3.4* 3.6   No results found for this basename: LIPASE, AMYLASE,  in the last 168 hours No results found for this basename: AMMONIA,  in the last 168 hours CBC:  Recent Labs Lab 06/17/13 0504 06/17/13 1115 06/18/13 0705  WBC 8.9 7.9 8.8  NEUTROABS 4.3  --   --   HGB 11.4* 11.9* 12.3*  HCT 35.1* 37.1* 38.8*  MCV 90.2 90.7 91.3  PLT 284 285 297   Cardiac Enzymes:  Recent Labs Lab 06/17/13 1115 06/17/13 1555 06/18/13 0027  TROPONINI <0.30 <0.30 <0.30   BNP: No components found with this basename: POCBNP,  CBG:  Recent Labs Lab 06/17/13 0521  GLUCAP 97    Time coordinating discharge:  Greater than 30 minutes  Signed:  Catarina Hartshorn, DO [facility] Pager: [phone] 06/18/2013, 11:59 AM

## 2013-06-18 NOTE — Evaluation (Addendum)
Physical Therapy Evaluation/ Discharge Patient Details Name: Austin Simmons MRN: [id number] DOB: Mar 10, 1960 Today's Date: 06/18/2013   History of Present Illness  53 yo male admitted with LT side weakness, CT (-) , MRI (-)  Clinical Impression  Pt with expressive difficulties and no family to confirm prior level of function. Pt reporting he works in [employer]ilities [employer] 3days/wk but that he falls all the time and needs a scooter. Pt with very inconsistent reports stating he has a nurse that has lived with him for 3 years that his mother pays for but doesn't know her name or company associated with. Pt reporting that he has pain on the left side that comes and goes and with testing bil UE 5/5 and pt denied left knee extension but full strength even in shortened ROM. Per pt report at baseline function. Pt demonstrates cognitive deficits and would recommend 24hr supervision for DC but do not feel pt will benefit from further therapy as not receptive to education or attempts as encouraging HEP. REcommend daily mobility with nursing. If supervision not at home would recommend ALF    Follow Up Recommendations Supervision for mobility/OOB;No PT follow up    Equipment Recommendations  Rolling walker with 5" wheels (if pt doesn't have one, mixed reports)    Recommendations for Other Services       Precautions / Restrictions Precautions Precautions: Fall      Mobility  Bed Mobility               General bed mobility comments: Pt EOB on arrival  Transfers Overall transfer level: Modified independent                  Ambulation/Gait Ambulation/Gait assistance: Supervision Ambulation Distance (Feet): 350 Feet Assistive device: Rolling walker (2 wheeled) Gait Pattern/deviations: Step-through pattern;Decreased stride length   Gait velocity interpretation: Below normal speed for age/gender General Gait Details: cues for position in Rw and directional cues. Pt in room started  ambulation without RW with antalgic dragging of LLE and grabbing for RW with normalization of gait  Stairs Stairs: Yes Stairs assistance: Modified independent (Device/Increase time) Stair Management: Two rails;Step to pattern;Forwards Number of Stairs: 6 General stair comments: pt ascending and descending with RLE although reports LLE too weak for stairs yet relying on LLE for descending  Wheelchair Mobility    Modified Rankin (Stroke Patients Only)       Balance Overall balance assessment: History of Falls                                           Pertinent Vitals/Pain Pain 6/10 left knee when muscle testing    Home Living Family/patient expects to be discharged to:: Private residence Living Arrangements: Children;Non-relatives/Friends Available Help at Discharge: Family;Available PRN/intermittently Type of Home: Mobile home Home Access: Stairs to enter Entrance Stairs-Rails: Right;Left Entrance Stairs-Number of Steps: 6 Home Layout: One level Home Equipment: Walker - 2 wheels;Shower seat;Bedside commode;Hand held shower head;Grab bars - tub/shower Additional Comments: pt reported to OT all equipment listed. He told PT no equipment    Prior Function Level of Independence: Independent               Hand Dominance   Dominant Hand: Right    Extremity/Trunk Assessment   Upper Extremity Assessment: Defer to OT evaluation           Lower Extremity  Assessment: RLE deficits/detail;LLE deficits/detail RLE Deficits / Details: 5/5: hip flexion, knee flexion and extension LLE Deficits / Details: 5/5 hip flexion, pt would not fully extend knee but even with partial flexion able to resist  Cervical / Trunk Assessment: Normal  Communication   Communication: Expressive difficulties  Cognition Arousal/Alertness: Awake/alert Behavior During Therapy: Flat affect Overall Cognitive Status: No family/caregiver present to determine baseline cognitive  functioning                      General Comments General comments (skin integrity, edema, etc.): Pt reports >10 falls this year    Exercises        Assessment/Plan    PT Assessment Patent does not need any further PT services  PT Diagnosis     PT Problem List    PT Treatment Interventions     PT Goals (Current goals can be found in the Care Plan section) Acute Rehab PT Goals PT Goal Formulation: No goals set, d/c therapy    Frequency     Barriers to discharge        Co-evaluation               End of Session Equipment Utilized During Treatment: Gait belt Activity Tolerance: Patient tolerated treatment well Patient left: in bed;with call bell/phone within reach;with nursing/sitter in room Nurse Communication: Mobility status    Functional Assessment Tool Used: clinical judgement Functional Limitation: Mobility: Walking and moving around Mobility: Walking and Moving Around Current Status ([id number]): At least 1 percent but less than 20 percent impaired, limited or restricted Mobility: Walking and Moving Around Goal Status [phone]): At least 1 percent but less than 20 percent impaired, limited or restricted Mobility: Walking and Moving Around Discharge Status [phone]([id number]): At least 1 percent but less than 20 percent impaired, limited or restricted    Time: [id number] PT Time Calculation (min): 15 min   Charges:   PT Evaluation $Initial PT Evaluation Tier I: 1 Procedure PT Treatments $Gait Training: 8-22 mins   PT G Codes:   Functional Assessment Tool Used: clinical judgement Functional Limitation: Mobility: Walking and moving around    IntelMaija B Eleri Ruben 06/18/2013, 11:01 AM Delaney MeigsMaija Tabor Edie Vallandingham, PT [phone]

## 2013-06-19 LAB — FOLATE RBC: RBC Folate: 552 ng/mL (ref >280)

## 2013-06-21 ENCOUNTER — Other Ambulatory Visit: Payer: Self-pay | Admitting: Cardiology

## 2013-06-24 ENCOUNTER — Other Ambulatory Visit: Payer: PRIVATE HEALTH INSURANCE

## 2013-07-01 ENCOUNTER — Telehealth: Payer: Self-pay | Admitting: *Deleted

## 2013-07-01 NOTE — Telephone Encounter (Signed)
New PA # for CT ordered in April. [phone] expires 08/15/13.

## 2013-07-07 ENCOUNTER — Inpatient Hospital Stay: Admission: RE | Admit: 2013-07-07 | Payer: PRIVATE HEALTH INSURANCE | Source: Ambulatory Visit

## 2013-07-14 ENCOUNTER — Telehealth: Payer: Self-pay | Admitting: Cardiology

## 2013-07-14 NOTE — Telephone Encounter (Signed)
New message     Want Dr Anne FuSkains to know that his ins will pay for a nurse to stay 8hrs a day.  He said Dr Anne FuSkains wanted to know this.  Please call back

## 2013-07-14 NOTE — Telephone Encounter (Signed)
Pt is calling because he states that he wanted to inform Dr Anne FuSkains and Florene GlenPh Alvin that he called his insurance company and they have approved for him to receive a HHN come 8 hours a day to help with pts current condition.  Pt states he needed to update Dr Anne FuSkains and Florene GlenPh Alvin so they could arrange for this service to be set up.  Told pt I will further inform both of them by routing this message to them.  Pt verbalized understanding and agrees with this plan.

## 2013-07-19 NOTE — Telephone Encounter (Signed)
I am glad that he is able to obtain help at home.  Austin SchultzSKAINS, MARK, MD

## 2013-07-20 ENCOUNTER — Ambulatory Visit: Payer: PRIVATE HEALTH INSURANCE | Admitting: Internal Medicine

## 2013-07-28 ENCOUNTER — Other Ambulatory Visit: Payer: Self-pay | Admitting: Cardiology

## 2013-07-29 ENCOUNTER — Ambulatory Visit: Payer: PRIVATE HEALTH INSURANCE | Admitting: Nurse Practitioner

## 2013-08-09 ENCOUNTER — Ambulatory Visit: Payer: PRIVATE HEALTH INSURANCE | Admitting: Physician Assistant

## 2013-08-23 ENCOUNTER — Ambulatory Visit: Payer: PRIVATE HEALTH INSURANCE | Admitting: Physician Assistant

## 2013-08-30 ENCOUNTER — Ambulatory Visit (INDEPENDENT_AMBULATORY_CARE_PROVIDER_SITE_OTHER): Payer: PRIVATE HEALTH INSURANCE | Admitting: Physician Assistant

## 2013-08-30 ENCOUNTER — Encounter: Payer: Self-pay | Admitting: Physician Assistant

## 2013-08-30 VITALS — BP 178/110 | HR 64 | Ht 66.0 in | Wt 203.0 lb

## 2013-08-30 DIAGNOSIS — R0602 Shortness of breath: Secondary | ICD-10-CM | POA: Diagnosis not present

## 2013-08-30 DIAGNOSIS — I2589 Other forms of chronic ischemic heart disease: Secondary | ICD-10-CM

## 2013-08-30 DIAGNOSIS — R55 Syncope and collapse: Secondary | ICD-10-CM

## 2013-08-30 DIAGNOSIS — F17200 Nicotine dependence, unspecified, uncomplicated: Secondary | ICD-10-CM

## 2013-08-30 DIAGNOSIS — I5022 Chronic systolic (congestive) heart failure: Secondary | ICD-10-CM

## 2013-08-30 DIAGNOSIS — E785 Hyperlipidemia, unspecified: Secondary | ICD-10-CM

## 2013-08-30 DIAGNOSIS — E038 Other specified hypothyroidism: Secondary | ICD-10-CM

## 2013-08-30 DIAGNOSIS — I255 Ischemic cardiomyopathy: Secondary | ICD-10-CM

## 2013-08-30 DIAGNOSIS — E039 Hypothyroidism, unspecified: Secondary | ICD-10-CM | POA: Insufficient documentation

## 2013-08-30 DIAGNOSIS — I2510 Atherosclerotic heart disease of native coronary artery without angina pectoris: Secondary | ICD-10-CM

## 2013-08-30 DIAGNOSIS — Z720 Tobacco use: Secondary | ICD-10-CM

## 2013-08-30 DIAGNOSIS — I10 Essential (primary) hypertension: Secondary | ICD-10-CM

## 2013-08-30 LAB — BRAIN NATRIURETIC PEPTIDE: Pro B Natriuretic peptide (BNP): 15 pg/mL (ref 0.0–100.0)

## 2013-08-30 LAB — BASIC METABOLIC PANEL
BUN: 19 mg/dL (ref 6–23)
CO2: 28 meq/L (ref 19–32)
CREATININE: 1.2 mg/dL (ref 0.4–1.5)
Calcium: 8.8 mg/dL (ref 8.4–10.5)
Chloride: 105 mEq/L (ref 96–112)
GFR: 69.2 mL/min (ref >60.00)
Glucose, Bld: 103 mg/dL — ABNORMAL HIGH (ref 70–99)
Potassium: 4.2 mEq/L (ref 3.5–5.1)
Sodium: 138 mEq/L (ref 135–145)

## 2013-08-30 MED ORDER — HYDRALAZINE HCL 25 MG PO TABS: 25.0000 mg | ORAL_TABLET | Freq: Three times a day (TID) | ORAL | Status: DC

## 2013-08-30 MED ORDER — CLONIDINE HCL 0.1 MG PO TABS
0.1000 mg | ORAL_TABLET | Freq: Once | ORAL | Status: AC
  Administered 2013-08-30: 0.1 mg via ORAL

## 2013-08-30 MED ORDER — HYDROCHLOROTHIAZIDE 25 MG PO TABS: 25.0000 mg | ORAL_TABLET | Freq: Three times a day (TID) | ORAL | Status: DC

## 2013-08-30 NOTE — Patient Instructions (Addendum)
Your physician has recommended you make the following change in your medication:   1. Start Hydralazine 25 mg three times a day.  Your physician recommends that you return for lab work today BNP and Bmet.  Your physician has recommended that you wear an event monitor. Event monitors are medical devices that record the heart's electrical activity. Doctors most often us these monitors to diagnose arrhythmias. Arrhythmias are problems with the speed or rhythm of the heartbeat. The monitor is a small, portable device. You can wear one while you do your normal daily activities. This is usually used to diagnose what is causing palpitations/syncope (passing out).  Your physician recommends that you schedule a follow-up appointment in: the end of the week or the beginning of next week for Nurse Visit with BP check and med bring medications to review.  Your physician recommends that you schedule a follow-up appointment in: 2 weeks with Tereso NewcomerScott Weaver, PA or Dr. Algis LimingSkains  [facility]. Please arrange follow up appointment. You have seen them in the past.

## 2013-08-30 NOTE — Progress Notes (Signed)
Cardiology Office Note   Date:  08/30/2013   ID:  Austin Simmons, DOB [birth year], MRN [id number]  PCP:  Doris Cheadle, MD  Cardiologist:  Dr. [name]     History of Present Illness: Austin Simmons is a 53 y.o. male with a prior hx of MI in the setting of cocaine use, HTN, tobacco abuse, CAD.  Admitted in 07/2012 with ongoing chest pain and CEs were negative but nuclear study demonstrated cardiomyopathy with an EF of 25% and inferior scar with peri-infarct ischemia.  LHC demonstrated high-grade disease in the mid LAD which was treated with a bare-metal stent.  Last seen here 02/2013. He was admitted in 05/2013 with left hemiparesis. MRI was negative for CVA. He did have cervical and lumbar spine degenerative disc and degenerative joint disease. There is no significant central canal narrowing. Patient admitted to noncompliance with medications.  TSH was markedly elevated and symptoms were likely felt related to untreated hypothyroidism. Of note, patient did have an echocardiogram in the hospital that demonstrated improved LV function with an EF of 40-45%.  He is here by himself.  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  He is a difficult historian.  He notes worsening DOE.  He denies orthopnea, weight gain, edema.  No chest pain.  He states that he passed out 3 times this summer.  He was walking out in the extreme heat.  He states he had no warning. He is not able to really provide a lot of details.  He tells me that he has a Charity fundraiser that is helping him and is with him every day.  I ultimately called the RN Sherre Lain [phone]).  She attends the same church and has been helping him pro bono.  She was able to tell me that he is taking all of his medications.  She notes that his BP remains high.  She states that he was out of Spironolactone but recently resumed this.  She was with him when he passed out.  But, there was no apparent decompensation or need to go to the ED.    Recent  Labs: 04/19/2013: HDL Cholesterol by NMR 29*; LDL (calc) NOT CALC  06/18/2013: ALT 17; Creatinine 1.06; Hemoglobin 12.3*; Potassium 4.2; TSH 53.840*   Wt Readings from Last 3 Encounters:  06/18/13 195 lb 14.4 oz (88.86 kg)  04/27/13 168 lb (76.204 kg)  04/19/13 198 lb (89.812 kg)     Past Medical History  Diagnosis Date  . Anxiety   . Hypertension   . Migraines     "I get them q few days" (08/11/2012)  . GERD (gastroesophageal reflux disease)   . Stroke 1990's    "memory problems since" (08/11/2012)  . Skull fracture 1974    "left me w/this stutter & migraine headaches; had to learn to walk, talk, everything again" (08/11/2012)  . Arthritis     "all over" (08/11/2012)  . Chronic lower back pain   . MI (myocardial infarction) 12/2010    cocaine induced/notes 01/18/2011   . Ischemic cardiomyopathy     a.  Myoview (07/2012): Inferior lateral infarct with minimal amount of peri-infarct ischemia, EF 25%.>>> s/p PCI to LAD 7/14 >>> b. Echo (5/15): EF 40-45%  . Coronary artery disease     a. LHC (08/11/12): Mid LAD 90, distal RCA 60-70, EF 30%. PCI: Veriflex (4 x 12 mm) BMS to the LAD.  RCA tx medically.   . Polysubstance abuse     cocaine and marijuana  .  Carotid stenosis     Carotid US (5/15): Bilateral ICA 1-39%  . Hx of echocardiogram     Echo (5/15): EF 40-45%, normal wall motion, mild LAE    Current Outpatient Prescriptions  Medication Sig Dispense Refill  . acetaminophen-codeine (TYLENOL #2) 300-15 MG per tablet Take 1 tablet by mouth every 4 (four) hours as needed for moderate pain.  45 tablet  0  . aspirin 81 MG chewable tablet Chew 81 mg by mouth daily.       Marland Kitchen. atorvastatin (LIPITOR) 80 MG tablet Take 1 tablet (80 mg total) by mouth daily at 6 PM.  30 tablet  4  . buPROPion (WELLBUTRIN) 100 MG tablet Take 1 tablet (100 mg total) by mouth 2 (two) times daily.  60 tablet  3  . cloNIDine (CATAPRES) 0.1 MG tablet Take 0.1 mg by mouth 2 (two) times daily.      . clopidogrel  (PLAVIX) 75 MG tablet TAKE 1 TABLET BY MOUTH DAILY WITH BREAKFAST  30 tablet  6  . levothyroxine (SYNTHROID, LEVOTHROID) 50 MCG tablet Take 1 tablet (50 mcg total) by mouth daily.  90 tablet  6  . losartan-hydrochlorothiazide (HYZAAR) 100-25 MG per tablet Take 1 tablet by mouth daily.  30 tablet  5  . metoprolol (LOPRESSOR) 100 MG tablet Take 1 tablet (100 mg total) by mouth 2 (two) times daily.  60 tablet  4  . nitroGLYCERIN (NITROSTAT) 0.4 MG SL tablet Place 1 tablet (0.4 mg total) under the tongue every 5 (five) minutes as needed. Chest pain  30 tablet  2  . pantoprazole (PROTONIX) 40 MG tablet Take 1 tablet (40 mg total) by mouth daily.  30 tablet  3  . spironolactone (ALDACTONE) 25 MG tablet Take 1 tablet (25 mg total) by mouth daily.  30 tablet  3   No current facility-administered medications for this visit.    Allergies:   Penicillins   Social History:  The patient  reports that he has been smoking Cigarettes.  He has a 30 pack-year smoking history. He has never used smokeless tobacco. He reports that he uses illicit drugs (Marijuana). He reports that he does not drink alcohol.   Family History:  The patient's family history includes Breast cancer in his mother; Ulcers in his father.   ROS:  Please see the history of present illness.    All other systems reviewed and negative.    PHYSICAL EXAM: VS:  BP 178/110  Pulse 64  Ht 5\' 6"  (1.676 m)  Wt 203 lb (92.08 kg)  BMI 32.78 kg/m2 Well nourished, well developed, in no acute distress HEENT: normal Neck: no JVD Cardiac:  normal S1, S2; RRR; no murmur Lungs:  clear to auscultation bilaterally, no wheezing, rhonchi or rales Abd: soft, nontender, no hepatomegaly Ext: no edema Skin: warm and dry Neuro:  CNs 2-12 intact, no focal abnormalities noted  EKG:  NSR, HR 64, normal axis, no ST changes      ASSESSMENT AND PLAN:  Syncope:  Etiology not entirely clear.  History is difficult to obtain.  Recent echo with improved EF (>  35%).  He does not drive.  I reviewed his case with Dr. Donato SchultzMark [name] by phone.  We feel it is reasonable to obtain an event monitor.  It is not clear if he will be able to tolerate this however.  I will try to obtain an event monitor to rule out arrhythmias.  Dyspnea:  Likely related to underlying COPD with ongoing smoking.  I will obtain a BMET and BNP. If BNP high, add Lasix.    Hypertension:  Uncontrolled.  We gave him Clonidine 0.1 x 1 in the office. His BP came down marginally. He denies any chest pain, HA, visual changes.  No evidence of end organ damage.  I will start him on Hydralazine 25 mg TID.  Return later this week with his medications to be reviewed by the RN (we need to see if he is really taking all of his medications appropriately) and to recheck his BP.   CAD:  No apparent angina. Continue aspirin, Plavix, statin.  Ischemic cardiomyopathy:  Continue beta blocker, ARB, spironolactone. Recent echo with improved LV function and EF of >35%.  Chronic Systolic CHF:  Volume appears stable.  He is more short of breath. Check BNP.  Hyperlipidemia:  Continue statin.  Tobacco abuse:  I stressed to him the importance of quitting.    Hypothyroidism:  F/u with PCP.  We will try to arrange follow up.    Disposition:   Follow up with Dr. Anne Fu or me in 2-3 weeks.  Of note, he asked me several times to contact his insurance company to get his RN paid for. I advised him to contact his insurance and ask them to fax [place] any orders they may need to sign for any services he may be getting.    Signed, Brynda Rim, MHS 08/30/2013 9:24 AM    [facility] [address] [place], [place], [place]  [address] Phone: [phone]; Fax: [phone]

## 2013-08-31 ENCOUNTER — Encounter: Payer: Self-pay | Admitting: *Deleted

## 2013-08-31 ENCOUNTER — Telehealth: Payer: Self-pay | Admitting: *Deleted

## 2013-08-31 ENCOUNTER — Other Ambulatory Visit: Payer: Self-pay | Admitting: Cardiology

## 2013-08-31 NOTE — Telephone Encounter (Signed)
lmptcb on phone of friend Austin Simmons. None of the phone #'s for pt are valid.

## 2013-08-31 NOTE — Telephone Encounter (Signed)
Pt needs OV, he is overdue. Nitro not on med list at [facility], therefore if pt makes an appt Dr. Anne FuSkains would need to ok refill.

## 2013-09-01 ENCOUNTER — Telehealth: Payer: Self-pay

## 2013-09-01 NOTE — Telephone Encounter (Signed)
This person is not in [facility]. I looked up DOB and NAME.

## 2013-09-01 NOTE — Telephone Encounter (Signed)
ptcb today and has been notified about lab results with verbal understanding. I verified a valid phone # for pt which he states is the [phone]. He said it was off last week but was back on. I said I could not lmom yesterday, I said thank you

## 2013-09-07 ENCOUNTER — Encounter (INDEPENDENT_AMBULATORY_CARE_PROVIDER_SITE_OTHER): Payer: PRIVATE HEALTH INSURANCE

## 2013-09-07 ENCOUNTER — Ambulatory Visit: Payer: PRIVATE HEALTH INSURANCE | Admitting: *Deleted

## 2013-09-07 ENCOUNTER — Encounter: Payer: Self-pay | Admitting: Radiology

## 2013-09-07 ENCOUNTER — Other Ambulatory Visit: Payer: Self-pay

## 2013-09-07 VITALS — BP 167/114 | HR 75 | Resp 20 | Ht 67.0 in | Wt 200.0 lb

## 2013-09-07 DIAGNOSIS — R55 Syncope and collapse: Secondary | ICD-10-CM

## 2013-09-07 DIAGNOSIS — I10 Essential (primary) hypertension: Secondary | ICD-10-CM

## 2013-09-07 MED ORDER — HYDRALAZINE HCL 50 MG PO TABS: 50.0000 mg | ORAL_TABLET | Freq: Three times a day (TID) | ORAL | Status: DC

## 2013-09-07 MED ORDER — LOSARTAN POTASSIUM-HCTZ 100-25 MG PO TABS: 1.0000 | ORAL_TABLET | Freq: Every day | ORAL | Status: DC

## 2013-09-07 MED ORDER — NITROGLYCERIN 0.4 MG SL SUBL: 0.4000 mg | SUBLINGUAL_TABLET | SUBLINGUAL | Status: DC | PRN

## 2013-09-07 NOTE — Progress Notes (Signed)
Patient ID: Austin Simmons, male   DOB: [birth year], 53 y.o.   MRN: [id number] [employer] 30 day monitor applied. EOS-10-09-13

## 2013-09-07 NOTE — Progress Notes (Signed)
1.) Reason for visit: BP check- recently started on Hydralazine 25 mg TID  2.) Name of MD requesting visit: Tereso NewcomerScott Weaver PA-C  3.) H&P: Pt has history of uncontrolled HTN and non-compliance with taking his prescribed meds. Pt recently seen by Tereso NewcomerScott Weaver PA-C on 8-10 and was given clonidine 0.1mg  x 1 in the office and was started on Hydralazine 25 mg TID, and scheduled to come in on 8/18 for BP recheck.  4.) ROS related to problem: Pt presents today for a BP recheck per Tereso NewcomerScott Weaver PA-C. Pt has recently been started on Hydralazine 25 mg TID at last OV with Tereso NewcomerScott Weaver PA-C on 8/10. Pt states he is being compliant with taking all meds prescribed. V/S today- Left arm 166/113- HR 75 Right Arm- BP 167/114 HR-73 Resp 20. Pt is asymptomatic and denies any cardiac complaints, HA, visual changes, stroke like symptoms. Pt continues to smoke cigarettes. Pt also received his holter monitor as ordered by Tereso NewcomerScott Weaver PA-C today. Went to DOD Dr Mayford Knifeurner about pts current BP readings and Scotts plan. Per Dr Mayford Knifeurner the pt should increase his hydralazine to 50 mg TID and continue with his scheduled f/u ov with Dr Anne FuSkains on 8/27. Updated pt on new orders and encouraged him to stop smoking. New med dose change sent to pharmacy of choice. Pt verbalized understanding and agrees with this plan.   5.) Assessment and plan per MD: Per DOD Dr Mayford Knifeurner the pt should increase his Hydralazine to 50 mg TID and continue with scheduled f/u OV with Dr Anne FuSkains on 09/16/13.   Above note reviewed and agree with plan as outlined above.  Armanda Magicraci TUrner, MD [facility]

## 2013-09-08 ENCOUNTER — Ambulatory Visit: Payer: PRIVATE HEALTH INSURANCE | Attending: Internal Medicine | Admitting: Internal Medicine

## 2013-09-08 ENCOUNTER — Encounter: Payer: Self-pay | Admitting: Internal Medicine

## 2013-09-08 VITALS — BP 142/90 | HR 74 | Temp 98.7°F | Resp 16 | Wt 203.8 lb

## 2013-09-08 DIAGNOSIS — M545 Low back pain, unspecified: Secondary | ICD-10-CM | POA: Diagnosis not present

## 2013-09-08 DIAGNOSIS — E039 Hypothyroidism, unspecified: Secondary | ICD-10-CM | POA: Insufficient documentation

## 2013-09-08 DIAGNOSIS — F17200 Nicotine dependence, unspecified, uncomplicated: Secondary | ICD-10-CM | POA: Diagnosis not present

## 2013-09-08 DIAGNOSIS — Z7982 Long term (current) use of aspirin: Secondary | ICD-10-CM | POA: Insufficient documentation

## 2013-09-08 DIAGNOSIS — Z79899 Other long term (current) drug therapy: Secondary | ICD-10-CM | POA: Diagnosis not present

## 2013-09-08 DIAGNOSIS — I2510 Atherosclerotic heart disease of native coronary artery without angina pectoris: Secondary | ICD-10-CM | POA: Insufficient documentation

## 2013-09-08 DIAGNOSIS — I10 Essential (primary) hypertension: Secondary | ICD-10-CM | POA: Diagnosis not present

## 2013-09-08 DIAGNOSIS — E038 Other specified hypothyroidism: Secondary | ICD-10-CM

## 2013-09-08 DIAGNOSIS — E785 Hyperlipidemia, unspecified: Secondary | ICD-10-CM | POA: Insufficient documentation

## 2013-09-08 DIAGNOSIS — G8929 Other chronic pain: Secondary | ICD-10-CM | POA: Diagnosis not present

## 2013-09-08 MED ORDER — ACETAMINOPHEN-CODEINE #2 300-15 MG PO TABS
1.0000 | ORAL_TABLET | Freq: Three times a day (TID) | ORAL | Status: DC | PRN
Start: 2013-09-08 — End: 2013-10-08

## 2013-09-08 MED ORDER — LEVOTHYROXINE SODIUM 50 MCG PO TABS: 50.0000 ug | ORAL_TABLET | Freq: Every day | ORAL | Status: DC

## 2013-09-08 MED ORDER — BUPROPION HCL 100 MG PO TABS: 100.0000 mg | ORAL_TABLET | Freq: Two times a day (BID) | ORAL | Status: DC

## 2013-09-08 NOTE — Patient Instructions (Signed)
DASH Eating Plan °DASH stands for "Dietary Approaches to Stop Hypertension." The DASH eating plan is a healthy eating plan that has been shown to reduce high blood pressure (hypertension). Additional health benefits may include reducing the risk of type 2 diabetes mellitus, heart disease, and stroke. The DASH eating plan may also help with weight loss. °WHAT DO I NEED TO KNOW ABOUT THE DASH EATING PLAN? °For the DASH eating plan, you will follow these general guidelines: °· Choose foods with a percent daily value for sodium of less than 5% (as listed on the food label). °· Use salt-free seasonings or herbs instead of table salt or sea salt. °· Check with your health care provider or pharmacist before using salt substitutes. °· Eat lower-sodium products, often labeled as "lower sodium" or "no salt added." °· Eat fresh foods. °· Eat more vegetables, fruits, and low-fat dairy products. °· Choose whole grains. Look for the word "whole" as the first word in the ingredient list. °· Choose fish and skinless chicken or turkey more often than red meat. Limit fish, poultry, and meat to 6 oz (170 g) each day. °· Limit sweets, desserts, sugars, and sugary drinks. °· Choose heart-healthy fats. °· Limit cheese to 1 oz (28 g) per day. °· Eat more home-cooked food and less restaurant, buffet, and fast food. °· Limit fried foods. °· Cook foods using methods other than frying. °· Limit canned vegetables. If you do use them, rinse them well to decrease the sodium. °· When eating at a restaurant, ask that your food be prepared with less salt, or no salt if possible. °WHAT FOODS CAN I EAT? °Seek help from a dietitian for individual calorie needs. °Grains °Whole grain or whole wheat bread. Brown rice. Whole grain or whole wheat pasta. Quinoa, bulgur, and whole grain cereals. Low-sodium cereals. Corn or whole wheat flour tortillas. Whole grain cornbread. Whole grain crackers. Low-sodium crackers. °Vegetables °Fresh or frozen vegetables  (raw, steamed, roasted, or grilled). Low-sodium or reduced-sodium tomato and vegetable juices. Low-sodium or reduced-sodium tomato sauce and paste. Low-sodium or reduced-sodium canned vegetables.  °Fruits °All fresh, canned (in natural juice), or frozen fruits. °Meat and Other Protein Products °Ground beef (85% or leaner), grass-fed beef, or beef trimmed of fat. Skinless chicken or turkey. Ground chicken or turkey. Pork trimmed of fat. All fish and seafood. Eggs. Dried beans, peas, or lentils. Unsalted nuts and seeds. Unsalted canned beans. °Dairy °Low-fat dairy products, such as skim or 1% milk, 2% or reduced-fat cheeses, low-fat ricotta or cottage cheese, or plain low-fat yogurt. Low-sodium or reduced-sodium cheeses. °Fats and Oils °Tub margarines without trans fats. Light or reduced-fat mayonnaise and salad dressings (reduced sodium). Avocado. Safflower, olive, or canola oils. Natural peanut or almond butter. °Other °Unsalted popcorn and pretzels. °The items listed above may not be a complete list of recommended foods or beverages. Contact your dietitian for more options. °WHAT FOODS ARE NOT RECOMMENDED? °Grains °White bread. White pasta. White rice. Refined cornbread. Bagels and croissants. Crackers that contain trans fat. °Vegetables °Creamed or fried vegetables. Vegetables in a cheese sauce. Regular canned vegetables. Regular canned tomato sauce and paste. Regular tomato and vegetable juices. °Fruits °Dried fruits. Canned fruit in light or heavy syrup. Fruit juice. °Meat and Other Protein Products °Fatty cuts of meat. Ribs, chicken wings, bacon, sausage, bologna, salami, chitterlings, fatback, hot dogs, bratwurst, and packaged luncheon meats. Salted nuts and seeds. Canned beans with salt. °Dairy °Whole or 2% milk, cream, half-and-half, and cream cheese. Whole-fat or sweetened yogurt. Full-fat   cheeses or blue cheese. Nondairy creamers and whipped toppings. Processed cheese, cheese spreads, or cheese  curds. °Condiments °Onion and garlic salt, seasoned salt, table salt, and sea salt. Canned and packaged gravies. Worcestershire sauce. Tartar sauce. Barbecue sauce. Teriyaki sauce. Soy sauce, including reduced sodium. Steak sauce. Fish sauce. Oyster sauce. Cocktail sauce. Horseradish. Ketchup and mustard. Meat flavorings and tenderizers. Bouillon cubes. Hot sauce. Tabasco sauce. Marinades. Taco seasonings. Relishes. °Fats and Oils °Butter, stick margarine, lard, shortening, ghee, and bacon fat. Coconut, palm kernel, or palm oils. Regular salad dressings. °Other °Pickles and olives. Salted popcorn and pretzels. °The items listed above may not be a complete list of foods and beverages to avoid. Contact your dietitian for more information. °WHERE CAN I FIND MORE INFORMATION? °National Heart, Lung, and Blood Institute: www.nhlbi.nih.gov/health/health-topics/topics/dash/ °Document Released: 12/27/2010 Document Revised: 05/24/2013 Document Reviewed: 11/11/2012 °ExitCare® Patient Information ©2015 [employer]. This information is not intended to replace advice given to you by your health care provider. Make sure you discuss any questions you have with your health care provider. ° °
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
## 2013-09-08 NOTE — Progress Notes (Signed)
Patient is here for follow up on his HTN and CAD Currently wearing a halter monitor for the next month

## 2013-09-10 NOTE — Telephone Encounter (Signed)
Should this be denied? Please advise. Thanks, MI

## 2013-09-10 NOTE — Telephone Encounter (Signed)
Yes until pt has OV with Dr Anne FuSkains

## 2013-09-13 NOTE — Progress Notes (Signed)
Agree Tereso Newcomer, PA-C   09/13/2013 5:23 PM

## 2013-09-16 ENCOUNTER — Encounter: Payer: Self-pay | Admitting: Cardiology

## 2013-09-16 ENCOUNTER — Ambulatory Visit (INDEPENDENT_AMBULATORY_CARE_PROVIDER_SITE_OTHER): Payer: PRIVATE HEALTH INSURANCE | Admitting: Cardiology

## 2013-09-16 VITALS — BP 110/78 | HR 62 | Ht 66.0 in | Wt 203.0 lb

## 2013-09-16 DIAGNOSIS — I255 Ischemic cardiomyopathy: Secondary | ICD-10-CM

## 2013-09-16 DIAGNOSIS — I639 Cerebral infarction, unspecified: Secondary | ICD-10-CM

## 2013-09-16 DIAGNOSIS — F17200 Nicotine dependence, unspecified, uncomplicated: Secondary | ICD-10-CM

## 2013-09-16 DIAGNOSIS — Z720 Tobacco use: Secondary | ICD-10-CM

## 2013-09-16 DIAGNOSIS — I10 Essential (primary) hypertension: Secondary | ICD-10-CM

## 2013-09-16 DIAGNOSIS — I2589 Other forms of chronic ischemic heart disease: Secondary | ICD-10-CM

## 2013-09-16 DIAGNOSIS — I6350 Cerebral infarction due to unspecified occlusion or stenosis of unspecified cerebral artery: Secondary | ICD-10-CM
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
## 2013-09-16 NOTE — Patient Instructions (Signed)
The current medical regimen is effective;  continue present plan and medications.  Follow up in 4 months with Dr [name]. 

## 2013-09-28 ENCOUNTER — Other Ambulatory Visit: Payer: Self-pay | Admitting: Cardiology

## 2013-09-28 NOTE — Telephone Encounter (Signed)
Call Documentation      Nita Sells, CMA at 09/01/2013 11:51 AM      Status: Signed            This person is not in [facility]. I looked up DOB and NAME.

## 2013-10-06 ENCOUNTER — Telehealth: Payer: Self-pay | Admitting: Internal Medicine

## 2013-10-06 NOTE — Telephone Encounter (Signed)
It's okay refill can be done.

## 2013-10-06 NOTE — Telephone Encounter (Signed)
Pt call requesting med refill for acetaminophen-codeine (TYLENOL #2) 300-15 MG per tablet [[id number]] Pt uses [facility] on [address]. Pls contact pt.

## 2013-10-06 NOTE — Telephone Encounter (Signed)
PT REQUESTING MEDICATION REFILL TYLENOL #3 PLEASE F/U LAST GIVEN 8/19

## 2013-10-08 ENCOUNTER — Telehealth: Payer: Self-pay

## 2013-10-08 DIAGNOSIS — M545 Low back pain: Principal | ICD-10-CM

## 2013-10-08 DIAGNOSIS — G8929 Other chronic pain: Secondary | ICD-10-CM

## 2013-10-08 MED ORDER — ACETAMINOPHEN-CODEINE #2 300-15 MG PO TABS: 1.0000 | ORAL_TABLET | Freq: Three times a day (TID) | ORAL | Status: DC | PRN

## 2013-10-08 NOTE — Telephone Encounter (Signed)
Patient here in office requesting refill on tylenol #2 Per Dr Orpah Cobb it is fine to refill Patient given prescription

## 2013-10-25 ENCOUNTER — Ambulatory Visit: Payer: PRIVATE HEALTH INSURANCE | Attending: Internal Medicine | Admitting: Internal Medicine

## 2013-10-25 ENCOUNTER — Encounter: Payer: Self-pay | Admitting: Internal Medicine

## 2013-10-25 VITALS — BP 149/87 | HR 72 | Temp 97.0°F | Resp 16 | Wt 205.0 lb

## 2013-10-25 DIAGNOSIS — I255 Ischemic cardiomyopathy: Secondary | ICD-10-CM | POA: Diagnosis not present

## 2013-10-25 DIAGNOSIS — Z87820 Personal history of traumatic brain injury: Secondary | ICD-10-CM | POA: Insufficient documentation

## 2013-10-25 DIAGNOSIS — M1990 Unspecified osteoarthritis, unspecified site: Secondary | ICD-10-CM | POA: Insufficient documentation

## 2013-10-25 DIAGNOSIS — G8929 Other chronic pain: Secondary | ICD-10-CM | POA: Insufficient documentation

## 2013-10-25 DIAGNOSIS — M62830 Muscle spasm of back: Secondary | ICD-10-CM | POA: Insufficient documentation

## 2013-10-25 DIAGNOSIS — R4782 Fluency disorder in conditions classified elsewhere: Secondary | ICD-10-CM | POA: Diagnosis not present

## 2013-10-25 DIAGNOSIS — E038 Other specified hypothyroidism: Secondary | ICD-10-CM

## 2013-10-25 DIAGNOSIS — F1410 Cocaine abuse, uncomplicated: Secondary | ICD-10-CM | POA: Insufficient documentation

## 2013-10-25 DIAGNOSIS — Z7982 Long term (current) use of aspirin: Secondary | ICD-10-CM | POA: Diagnosis not present

## 2013-10-25 DIAGNOSIS — Z7902 Long term (current) use of antithrombotics/antiplatelets: Secondary | ICD-10-CM | POA: Diagnosis not present

## 2013-10-25 DIAGNOSIS — Z79899 Other long term (current) drug therapy: Secondary | ICD-10-CM | POA: Diagnosis not present

## 2013-10-25 DIAGNOSIS — K219 Gastro-esophageal reflux disease without esophagitis: Secondary | ICD-10-CM | POA: Insufficient documentation

## 2013-10-25 DIAGNOSIS — F17210 Nicotine dependence, cigarettes, uncomplicated: Secondary | ICD-10-CM | POA: Insufficient documentation

## 2013-10-25 DIAGNOSIS — I2510 Atherosclerotic heart disease of native coronary artery without angina pectoris: Secondary | ICD-10-CM | POA: Insufficient documentation

## 2013-10-25 DIAGNOSIS — M545 Low back pain: Secondary | ICD-10-CM | POA: Diagnosis not present

## 2013-10-25 DIAGNOSIS — Z720 Tobacco use: Secondary | ICD-10-CM

## 2013-10-25 DIAGNOSIS — G43909 Migraine, unspecified, not intractable, without status migrainosus: Secondary | ICD-10-CM | POA: Diagnosis not present

## 2013-10-25 DIAGNOSIS — Z9889 Other specified postprocedural states: Secondary | ICD-10-CM | POA: Diagnosis not present

## 2013-10-25 DIAGNOSIS — Z8673 Personal history of transient ischemic attack (TIA), and cerebral infarction without residual deficits: Secondary | ICD-10-CM | POA: Insufficient documentation

## 2013-10-25 DIAGNOSIS — E781 Pure hyperglyceridemia: Secondary | ICD-10-CM | POA: Insufficient documentation

## 2013-10-25 DIAGNOSIS — I6529 Occlusion and stenosis of unspecified carotid artery: Secondary | ICD-10-CM | POA: Insufficient documentation

## 2013-10-25 DIAGNOSIS — E039 Hypothyroidism, unspecified: Secondary | ICD-10-CM | POA: Diagnosis not present

## 2013-10-25 DIAGNOSIS — Z8249 Family history of ischemic heart disease and other diseases of the circulatory system: Secondary | ICD-10-CM | POA: Diagnosis not present

## 2013-10-25 DIAGNOSIS — F419 Anxiety disorder, unspecified: Secondary | ICD-10-CM | POA: Insufficient documentation

## 2013-10-25 DIAGNOSIS — I10 Essential (primary) hypertension: Secondary | ICD-10-CM | POA: Insufficient documentation

## 2013-10-25 DIAGNOSIS — I252 Old myocardial infarction: Secondary | ICD-10-CM | POA: Diagnosis not present

## 2013-10-25 DIAGNOSIS — M549 Dorsalgia, unspecified: Secondary | ICD-10-CM | POA: Diagnosis present

## 2013-10-25 DIAGNOSIS — F1210 Cannabis abuse, uncomplicated: Secondary | ICD-10-CM | POA: Insufficient documentation

## 2013-10-25 DIAGNOSIS — F17200 Nicotine dependence, unspecified, uncomplicated: Secondary | ICD-10-CM | POA: Insufficient documentation

## 2013-10-25 MED ORDER — BACLOFEN 10 MG PO TABS: 10.0000 mg | ORAL_TABLET | Freq: Every day | ORAL | Status: DC

## 2013-10-25 MED ORDER — TRAMADOL HCL 50 MG PO TABS: 50.0000 mg | ORAL_TABLET | Freq: Three times a day (TID) | ORAL | Status: DC | PRN

## 2013-10-25 NOTE — Patient Instructions (Signed)
DASH Eating Plan °DASH stands for "Dietary Approaches to Stop Hypertension." The DASH eating plan is a healthy eating plan that has been shown to reduce high blood pressure (hypertension). Additional health benefits may include reducing the risk of type 2 diabetes mellitus, heart disease, and stroke. The DASH eating plan may also help with weight loss. °WHAT DO I NEED TO KNOW ABOUT THE DASH EATING PLAN? °For the DASH eating plan, you will follow these general guidelines: °· Choose foods with a percent daily value for sodium of less than 5% (as listed on the food label). °· Use salt-free seasonings or herbs instead of table salt or sea salt. °· Check with your health care provider or pharmacist before using salt substitutes. °· Eat lower-sodium products, often labeled as "lower sodium" or "no salt added." °· Eat fresh foods. °· Eat more vegetables, fruits, and low-fat dairy products. °· Choose whole grains. Look for the word "whole" as the first word in the ingredient list. °· Choose fish and skinless chicken or turkey more often than red meat. Limit fish, poultry, and meat to 6 oz (170 g) each day. °· Limit sweets, desserts, sugars, and sugary drinks. °· Choose heart-healthy fats. °· Limit cheese to 1 oz (28 g) per day. °· Eat more home-cooked food and less restaurant, buffet, and fast food. °· Limit fried foods. °· Cook foods using methods other than frying. °· Limit canned vegetables. If you do use them, rinse them well to decrease the sodium. °· When eating at a restaurant, ask that your food be prepared with less salt, or no salt if possible. °WHAT FOODS CAN I EAT? °Seek help from a dietitian for individual calorie needs. °Grains °Whole grain or whole wheat bread. Brown rice. Whole grain or whole wheat pasta. Quinoa, bulgur, and whole grain cereals. Low-sodium cereals. Corn or whole wheat flour tortillas. Whole grain cornbread. Whole grain crackers. Low-sodium crackers. °Vegetables °Fresh or frozen vegetables  (raw, steamed, roasted, or grilled). Low-sodium or reduced-sodium tomato and vegetable juices. Low-sodium or reduced-sodium tomato sauce and paste. Low-sodium or reduced-sodium canned vegetables.  °Fruits °All fresh, canned (in natural juice), or frozen fruits. °Meat and Other Protein Products °Ground beef (85% or leaner), grass-fed beef, or beef trimmed of fat. Skinless chicken or turkey. Ground chicken or turkey. Pork trimmed of fat. All fish and seafood. Eggs. Dried beans, peas, or lentils. Unsalted nuts and seeds. Unsalted canned beans. °Dairy °Low-fat dairy products, such as skim or 1% milk, 2% or reduced-fat cheeses, low-fat ricotta or cottage cheese, or plain low-fat yogurt. Low-sodium or reduced-sodium cheeses. °Fats and Oils °Tub margarines without trans fats. Light or reduced-fat mayonnaise and salad dressings (reduced sodium). Avocado. Safflower, olive, or canola oils. Natural peanut or almond butter. °Other °Unsalted popcorn and pretzels. °The items listed above may not be a complete list of recommended foods or beverages. Contact your dietitian for more options. °WHAT FOODS ARE NOT RECOMMENDED? °Grains °White bread. White pasta. White rice. Refined cornbread. Bagels and croissants. Crackers that contain trans fat. °Vegetables °Creamed or fried vegetables. Vegetables in a cheese sauce. Regular canned vegetables. Regular canned tomato sauce and paste. Regular tomato and vegetable juices. °Fruits °Dried fruits. Canned fruit in light or heavy syrup. Fruit juice. °Meat and Other Protein Products °Fatty cuts of meat. Ribs, chicken wings, bacon, sausage, bologna, salami, chitterlings, fatback, hot dogs, bratwurst, and packaged luncheon meats. Salted nuts and seeds. Canned beans with salt. °Dairy °Whole or 2% milk, cream, half-and-half, and cream cheese. Whole-fat or sweetened yogurt. Full-fat   cheeses or blue cheese. Nondairy creamers and whipped toppings. Processed cheese, cheese spreads, or cheese  curds. °Condiments °Onion and garlic salt, seasoned salt, table salt, and sea salt. Canned and packaged gravies. Worcestershire sauce. Tartar sauce. Barbecue sauce. Teriyaki sauce. Soy sauce, including reduced sodium. Steak sauce. Fish sauce. Oyster sauce. Cocktail sauce. Horseradish. Ketchup and mustard. Meat flavorings and tenderizers. Bouillon cubes. Hot sauce. Tabasco sauce. Marinades. Taco seasonings. Relishes. °Fats and Oils °Butter, stick margarine, lard, shortening, ghee, and bacon fat. Coconut, palm kernel, or palm oils. Regular salad dressings. °Other °Pickles and olives. Salted popcorn and pretzels. °The items listed above may not be a complete list of foods and beverages to avoid. Contact your dietitian for more information. °WHERE CAN I FIND MORE INFORMATION? °National Heart, Lung, and Blood Institute: www.nhlbi.nih.gov/health/health-topics/topics/dash/ °Document Released: 12/27/2010 Document Revised: 05/24/2013 Document Reviewed: 11/11/2012 °ExitCare® Patient Information ©2015 [employer]. This information is not intended to replace advice given to you by your health care provider. Make sure you discuss any questions you have with your health care provider. ° °
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
## 2013-10-25 NOTE — Progress Notes (Signed)
Patient here for follow up on his back pain Requesting a change in his pain medications

## 2013-10-26 ENCOUNTER — Telehealth: Payer: Self-pay | Admitting: *Deleted

## 2013-10-26 LAB — COMPLETE METABOLIC PANEL WITH GFR
ALBUMIN: 4.3 g/dL (ref 3.5–5.2)
ALT: 22 U/L (ref 0–53)
AST: 21 U/L (ref 0–37)
Alkaline Phosphatase: 54 U/L (ref 39–117)
BILIRUBIN TOTAL: 0.3 mg/dL (ref 0.2–1.2)
BUN: 24 mg/dL — ABNORMAL HIGH (ref 6–23)
CO2: 28 mEq/L (ref 19–32)
Calcium: 9.3 mg/dL (ref 8.4–10.5)
Chloride: 103 mEq/L (ref 96–112)
Creat: 1.25 mg/dL (ref 0.50–1.35)
GFR, Est African American: 77 mL/min
GFR, Est Non African American: 66 mL/min
GLUCOSE: 108 mg/dL — AB (ref 70–99)
Potassium: 5.5 mEq/L — ABNORMAL HIGH (ref 3.5–5.3)
Sodium: 139 mEq/L (ref 135–145)
TOTAL PROTEIN: 7.3 g/dL (ref 6.0–8.3)

## 2013-10-26 LAB — TSH: TSH: 30.825 u[IU]/mL — ABNORMAL HIGH (ref 0.350–4.500)

## 2013-10-26 NOTE — Telephone Encounter (Signed)
Message copied by Raynelle CharyWINFREE, Kamori Barbier R on Tue Oct 26, 2013  4:34 PM ------      Message from: Doris CheadleADVANI, DEEPAK      Created: Tue Oct 26, 2013  2:20 PM       Call and that the patient know that his TSH level is still abnormal, advise patient to increase the dose of levothyroxine to 75 mcg daily. Will repeat his TSH level on the following visit, Also noticed borderline elevated potassium, advise patient to  avoid  high potassium containing foods. ------

## 2013-10-26 NOTE — Telephone Encounter (Signed)
Pt is aware of his lab results and the med change. Pt will start a low potassium diet.

## 2013-10-27 ENCOUNTER — Telehealth: Payer: Self-pay | Admitting: Internal Medicine

## 2013-10-27 ENCOUNTER — Telehealth: Payer: Self-pay

## 2013-10-27 NOTE — Telephone Encounter (Signed)
Patient states he was told to call today to speak to nurse. Patient was unable to give me reason for the call. Please assist.

## 2013-10-27 NOTE — Telephone Encounter (Signed)
Patient states insurance co sent form for patient to get a electric chair i instructed patient i will  Need to look for  And locate form for dr. To fill out

## 2013-11-06 ENCOUNTER — Other Ambulatory Visit: Payer: Self-pay | Admitting: Cardiology

## 2013-11-08 ENCOUNTER — Encounter (HOSPITAL_BASED_OUTPATIENT_CLINIC_OR_DEPARTMENT_OTHER): Payer: Self-pay | Admitting: Emergency Medicine

## 2013-11-08 ENCOUNTER — Emergency Department (HOSPITAL_BASED_OUTPATIENT_CLINIC_OR_DEPARTMENT_OTHER)
Admission: EM | Admit: 2013-11-08 | Discharge: 2013-11-08 | Disposition: A | Discharge status: Home / Self Care | Payer: PRIVATE HEALTH INSURANCE | Attending: Emergency Medicine | Admitting: Emergency Medicine

## 2013-11-08 DIAGNOSIS — I10 Essential (primary) hypertension: Secondary | ICD-10-CM | POA: Insufficient documentation

## 2013-11-08 DIAGNOSIS — M5442 Lumbago with sciatica, left side: Secondary | ICD-10-CM | POA: Insufficient documentation

## 2013-11-08 DIAGNOSIS — F419 Anxiety disorder, unspecified: Secondary | ICD-10-CM | POA: Diagnosis not present

## 2013-11-08 DIAGNOSIS — I2510 Atherosclerotic heart disease of native coronary artery without angina pectoris: Secondary | ICD-10-CM | POA: Insufficient documentation

## 2013-11-08 DIAGNOSIS — G8929 Other chronic pain: Secondary | ICD-10-CM | POA: Diagnosis not present

## 2013-11-08 DIAGNOSIS — Z8673 Personal history of transient ischemic attack (TIA), and cerebral infarction without residual deficits: Secondary | ICD-10-CM | POA: Insufficient documentation

## 2013-11-08 DIAGNOSIS — Z8781 Personal history of (healed) traumatic fracture: Secondary | ICD-10-CM | POA: Insufficient documentation

## 2013-11-08 DIAGNOSIS — Z7982 Long term (current) use of aspirin: Secondary | ICD-10-CM | POA: Insufficient documentation

## 2013-11-08 DIAGNOSIS — G43909 Migraine, unspecified, not intractable, without status migrainosus: Secondary | ICD-10-CM | POA: Diagnosis not present

## 2013-11-08 DIAGNOSIS — M545 Low back pain: Secondary | ICD-10-CM | POA: Diagnosis present

## 2013-11-08 DIAGNOSIS — M5441 Lumbago with sciatica, right side: Secondary | ICD-10-CM | POA: Diagnosis not present

## 2013-11-08 DIAGNOSIS — K219 Gastro-esophageal reflux disease without esophagitis: Secondary | ICD-10-CM | POA: Insufficient documentation

## 2013-11-08 DIAGNOSIS — E079 Disorder of thyroid, unspecified: Secondary | ICD-10-CM | POA: Insufficient documentation

## 2013-11-08 DIAGNOSIS — Z880 Allergy status to penicillin: Secondary | ICD-10-CM | POA: Diagnosis not present

## 2013-11-08 DIAGNOSIS — M1990 Unspecified osteoarthritis, unspecified site: Secondary | ICD-10-CM | POA: Insufficient documentation

## 2013-11-08 DIAGNOSIS — Z79899 Other long term (current) drug therapy: Secondary | ICD-10-CM | POA: Diagnosis not present

## 2013-11-08 DIAGNOSIS — I252 Old myocardial infarction: Secondary | ICD-10-CM | POA: Insufficient documentation

## 2013-11-08 DIAGNOSIS — Z720 Tobacco use: Secondary | ICD-10-CM | POA: Insufficient documentation

## 2013-11-08 HISTORY — DX: Disorder of thyroid, unspecified: E07.9

## 2013-11-08 MED ORDER — HYDROCODONE-ACETAMINOPHEN 5-325 MG PO TABS: 1.0000 | ORAL_TABLET | ORAL | Status: DC | PRN

## 2013-11-08 NOTE — Discharge Instructions (Signed)
Back Pain, Adult °Back pain is very common. The pain often gets better over time. The cause of back pain is usually not dangerous. Most people can learn to manage their back pain on their own.  °HOME CARE  °· Stay active. Start with short walks on flat ground if you can. Try to walk farther each day. °· Do not sit, drive, or stand in one place for more than 30 minutes. Do not stay in bed. °· Do not avoid exercise or work. Activity can help your back heal faster. °· Be careful when you bend or lift an object. Bend at your knees, keep the object close to you, and do not twist. °· Sleep on a firm mattress. Lie on your side, and bend your knees. If you lie on your back, put a pillow under your knees. °· Only take medicines as told by your doctor. °· Put ice on the injured area. °¨ Put ice in a plastic bag. °¨ Place a towel between your skin and the bag. °¨ Leave the ice on for 15-20 minutes, 03-04 times a day for the first 2 to 3 days. After that, you can switch between ice and heat packs. °· Ask your doctor about back exercises or massage. °· Avoid feeling anxious or stressed. Find good ways to deal with stress, such as exercise. °GET HELP RIGHT AWAY IF:  °· Your pain does not go away with rest or medicine. °· Your pain does not go away in 1 week. °· You have new problems. °· You do not feel well. °· The pain spreads into your legs. °· You cannot control when you poop (bowel movement) or pee (urinate). °· Your arms or legs feel weak or lose feeling (numbness). °· You feel sick to your stomach (nauseous) or throw up (vomit). °· You have belly (abdominal) pain. °· You feel like you may pass out (faint). °MAKE SURE YOU:  °· Understand these instructions. °· Will watch your condition. °· Will get help right away if you are not doing well or get worse. °Document Released: 06/26/2007 Document Revised: 04/01/2011 Document Reviewed: 05/11/2013 °[employer]® Patient Information ©2015 [employer]. This information is not intended  to replace advice given to you by your health care provider. Make sure you discuss any questions you have with your health care provider. ° °
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
## 2013-11-08 NOTE — ED Notes (Signed)
Pt reports hx of back issues- states pain is worse since it got cold over the weekend- denies new injury

## 2013-11-15 ENCOUNTER — Telehealth: Payer: Self-pay

## 2013-11-15 ENCOUNTER — Other Ambulatory Visit: Payer: Self-pay

## 2013-11-15 DIAGNOSIS — G8929 Other chronic pain: Secondary | ICD-10-CM

## 2013-11-15 DIAGNOSIS — R55 Syncope and collapse: Secondary | ICD-10-CM

## 2013-11-15 DIAGNOSIS — M545 Low back pain: Secondary | ICD-10-CM

## 2013-11-15 MED ORDER — ACETAMINOPHEN-CODEINE #2 300-15 MG PO TABS: 1.0000 | ORAL_TABLET | Freq: Three times a day (TID) | ORAL | Status: DC | PRN

## 2013-11-15 NOTE — Telephone Encounter (Signed)
Patient came into office requesting a refill on his tylenol #2 Per Dr Orpah CobbAdvani prescription refilled

## 2013-11-15 NOTE — Telephone Encounter (Signed)
Patient came in with documents that he needs a prescription  To be faxed to [facility] for a motorized chair for his syncope on exertion Prescription faxed to [facility]

## 2013-11-19 ENCOUNTER — Other Ambulatory Visit: Payer: Self-pay | Admitting: Cardiology

## 2013-12-12 ENCOUNTER — Other Ambulatory Visit: Payer: Self-pay | Admitting: Cardiology

## 2013-12-30 ENCOUNTER — Encounter (HOSPITAL_COMMUNITY): Payer: Self-pay | Admitting: Interventional Cardiology

## 2014-01-03 ENCOUNTER — Ambulatory Visit (INDEPENDENT_AMBULATORY_CARE_PROVIDER_SITE_OTHER): Payer: PRIVATE HEALTH INSURANCE | Admitting: Cardiology

## 2014-01-03 ENCOUNTER — Encounter: Payer: Self-pay | Admitting: Cardiology

## 2014-01-03 VITALS — BP 134/86 | HR 88 | Ht 66.0 in | Wt 207.0 lb

## 2014-01-03 DIAGNOSIS — G458 Other transient cerebral ischemic attacks and related syndromes: Secondary | ICD-10-CM

## 2014-01-03 DIAGNOSIS — Z720 Tobacco use: Secondary | ICD-10-CM

## 2014-01-03 DIAGNOSIS — I255 Ischemic cardiomyopathy: Secondary | ICD-10-CM

## 2014-01-03 DIAGNOSIS — F17200 Nicotine dependence, unspecified, uncomplicated: Secondary | ICD-10-CM

## 2014-01-03 DIAGNOSIS — I10 Essential (primary) hypertension: Secondary | ICD-10-CM

## 2014-01-03 MED ORDER — NITROGLYCERIN 0.4 MG SL SUBL: 0.4000 mg | SUBLINGUAL_TABLET | SUBLINGUAL | Status: AC | PRN
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
## 2014-01-03 NOTE — Patient Instructions (Signed)
The current medical regimen is effective;  continue present plan and medications.  Follow up in 6 months with Dr. [name].  You will receive a letter in the mail 2 months before you are due.  Please call us when you receive this letter to schedule your follow up appointment.  

## 2014-01-18 ENCOUNTER — Ambulatory Visit: Payer: PRIVATE HEALTH INSURANCE | Admitting: Internal Medicine

## 2014-02-02 ENCOUNTER — Other Ambulatory Visit: Payer: Self-pay

## 2014-02-02 DIAGNOSIS — Z7409 Other reduced mobility: Secondary | ICD-10-CM

## 2014-02-02 NOTE — Progress Notes (Unsigned)
Patient in need of mobility examination Patient will require a PT evaluation Referral placed in epic

## 2014-02-17 ENCOUNTER — Telehealth: Payer: Self-pay | Admitting: Cardiology

## 2014-02-17 NOTE — Telephone Encounter (Signed)
Spoke with pt who states every time he reaches across his body with his left arm he has "big sharp pains" in his heart and he can't breathe.  He is very difficult to understand with rambling slurred speech.  He does state he has taken 7 SL Ntg with relief of his pain.  Advised if having active chest pain he should call 911 to be taken to the hospital for evaluation.  He states he is not going to do that and will just wait for the appt he has previously schedule on 2/4.  Again advised he needs evaluation at the hospital for his s/s.

## 2014-02-17 NOTE — Telephone Encounter (Signed)
Pain sounds MSK in origin. Continue to monitor.  Donato SchultzSKAINS, MARK, MD

## 2014-02-17 NOTE — Telephone Encounter (Signed)
New Message        Pt calling stating that every time he reaches his arm across his body it causes his heart to hurt. Pt is requesting a call back from a nurse to discuss.

## 2014-02-20 ENCOUNTER — Other Ambulatory Visit: Payer: Self-pay | Admitting: Cardiology

## 2014-02-24 ENCOUNTER — Ambulatory Visit (INDEPENDENT_AMBULATORY_CARE_PROVIDER_SITE_OTHER): Payer: PRIVATE HEALTH INSURANCE | Admitting: Physician Assistant

## 2014-02-24 ENCOUNTER — Encounter: Payer: Self-pay | Admitting: Physician Assistant

## 2014-02-24 ENCOUNTER — Telehealth: Payer: Self-pay | Admitting: Physician Assistant

## 2014-02-24 VITALS — BP 100/70 | HR 68 | Ht 66.0 in | Wt 209.0 lb

## 2014-02-24 DIAGNOSIS — R0789 Other chest pain: Secondary | ICD-10-CM | POA: Diagnosis not present

## 2014-02-24 DIAGNOSIS — E785 Hyperlipidemia, unspecified: Secondary | ICD-10-CM

## 2014-02-24 DIAGNOSIS — I5022 Chronic systolic (congestive) heart failure: Secondary | ICD-10-CM | POA: Diagnosis not present

## 2014-02-24 DIAGNOSIS — F17200 Nicotine dependence, unspecified, uncomplicated: Secondary | ICD-10-CM

## 2014-02-24 DIAGNOSIS — I255 Ischemic cardiomyopathy: Secondary | ICD-10-CM

## 2014-02-24 DIAGNOSIS — I10 Essential (primary) hypertension: Secondary | ICD-10-CM | POA: Diagnosis not present

## 2014-02-24 DIAGNOSIS — I25119 Atherosclerotic heart disease of native coronary artery with unspecified angina pectoris: Secondary | ICD-10-CM

## 2014-02-24 DIAGNOSIS — Z720 Tobacco use: Secondary | ICD-10-CM

## 2014-02-24 LAB — CBC WITH DIFFERENTIAL/PLATELET
Basophils Absolute: 0.1 10*3/uL (ref 0.0–0.1)
Basophils Relative: 0.6 % (ref 0.0–3.0)
Eosinophils Absolute: 0.3 10*3/uL (ref 0.0–0.7)
Eosinophils Relative: 2.7 % (ref 0.0–5.0)
HEMATOCRIT: 38.7 % — AB (ref 39.0–52.0)
HEMOGLOBIN: 13.1 g/dL (ref 13.0–17.0)
LYMPHS PCT: 26.3 % (ref 12.0–46.0)
Lymphs Abs: 2.6 10*3/uL (ref 0.7–4.0)
MCHC: 33.8 g/dL (ref 30.0–36.0)
MCV: 85.9 fl (ref 78.0–100.0)
MONO ABS: 1.2 10*3/uL — AB (ref 0.1–1.0)
Monocytes Relative: 12.3 % — ABNORMAL HIGH (ref 3.0–12.0)
NEUTROS ABS: 5.8 10*3/uL (ref 1.4–7.7)
Neutrophils Relative %: 58.1 % (ref 43.0–77.0)
Platelets: 407 10*3/uL — ABNORMAL HIGH (ref 150.0–400.0)
RBC: 4.5 Mil/uL (ref 4.22–5.81)
RDW: 14.1 % (ref 11.5–15.5)
WBC: 9.9 10*3/uL (ref 4.0–10.5)

## 2014-02-24 LAB — BASIC METABOLIC PANEL
BUN: 27 mg/dL — ABNORMAL HIGH (ref 6–23)
CALCIUM: 9.5 mg/dL (ref 8.4–10.5)
CO2: 26 mEq/L (ref 19–32)
Chloride: 103 mEq/L (ref 96–112)
Creatinine, Ser: 1.66 mg/dL — ABNORMAL HIGH (ref 0.40–1.50)
GFR: 46.58 mL/min — ABNORMAL LOW (ref >60.00)
Glucose, Bld: 95 mg/dL (ref 70–99)
Potassium: 4.1 mEq/L (ref 3.5–5.1)
SODIUM: 137 meq/L (ref 135–145)

## 2014-02-24 MED ORDER — HYDRALAZINE HCL 25 MG PO TABS: 25.0000 mg | ORAL_TABLET | Freq: Three times a day (TID) | ORAL | Status: DC
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
## 2014-02-24 NOTE — Telephone Encounter (Signed)
FYI  Patient refused to schedule Lexiscan at check- out.  Claudina LickChristy Clark [facility]

## 2014-02-24 NOTE — Patient Instructions (Signed)
Your physician has recommended you make the following change in your medication:  1. DECREASE HYDRALAZINE TO 25 MG 1 TABLET THREE TIMES DAILY; NEW RX SENT IN   LAB WORK TODAY; BMET, CBC W/DIFF  Your physician has requested that you have a lexiscan myoview. For further information please visit [email or web address]. Please follow instruction sheet, as given.  Your physician recommends that you schedule a follow-up appointment in: 2 WEEKS WITH DR. Anne FuSKAINS PER DR. Anne FuSKAINS

## 2014-02-25 ENCOUNTER — Other Ambulatory Visit: Payer: Self-pay | Admitting: *Deleted

## 2014-02-25 DIAGNOSIS — I2510 Atherosclerotic heart disease of native coronary artery without angina pectoris: Secondary | ICD-10-CM

## 2014-02-25 MED ORDER — LOSARTAN POTASSIUM 100 MG PO TABS: 100.0000 mg | ORAL_TABLET | Freq: Every day | ORAL | Status: DC

## 2014-03-04 ENCOUNTER — Other Ambulatory Visit: Payer: Medicaid Other

## 2014-03-11 ENCOUNTER — Ambulatory Visit: Payer: Medicaid Other | Admitting: Cardiology

## 2014-03-19 ENCOUNTER — Other Ambulatory Visit: Payer: Self-pay | Admitting: Cardiology

## 2014-03-22 ENCOUNTER — Encounter: Payer: Self-pay | Admitting: Cardiology

## 2014-03-24 ENCOUNTER — Telehealth: Payer: Self-pay | Admitting: Cardiology

## 2014-03-24 NOTE — Telephone Encounter (Signed)
Will forward to Dr [name] for his knowledge.  °

## 2014-03-24 NOTE — Telephone Encounter (Signed)
So sorry to hear this.  Donato SchultzSKAINS, Austin Buttery, MD

## 2014-03-24 NOTE — Telephone Encounter (Signed)
Called and left message on pts voicemail to call back to reschedule both his lab appt and his f/u with Dr Anne FuSkains (he no showed 03/11/14)
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
## 2014-04-07 ENCOUNTER — Encounter (HOSPITAL_COMMUNITY): Payer: Self-pay | Admitting: Emergency Medicine

## 2014-04-07 ENCOUNTER — Ambulatory Visit (HOSPITAL_COMMUNITY): Admit: 2014-04-07 | Payer: Self-pay | Admitting: Interventional Cardiology

## 2014-04-07 ENCOUNTER — Emergency Department (HOSPITAL_COMMUNITY): Payer: Medicare Other

## 2014-04-07 ENCOUNTER — Inpatient Hospital Stay (HOSPITAL_COMMUNITY)
Admission: EM | Admit: 2014-04-07 | Discharge: 2014-04-07 | DRG: 249 | Discharge status: Against Medical Advice | Payer: Medicare Other | Attending: Cardiology | Admitting: Cardiology

## 2014-04-07 ENCOUNTER — Encounter (HOSPITAL_COMMUNITY)
Admission: EM | Discharge status: Against Medical Advice | Payer: PRIVATE HEALTH INSURANCE | Source: Home / Self Care | Attending: Cardiology

## 2014-04-07 DIAGNOSIS — E785 Hyperlipidemia, unspecified: Secondary | ICD-10-CM | POA: Diagnosis not present

## 2014-04-07 DIAGNOSIS — I255 Ischemic cardiomyopathy: Secondary | ICD-10-CM | POA: Diagnosis not present

## 2014-04-07 DIAGNOSIS — F17210 Nicotine dependence, cigarettes, uncomplicated: Secondary | ICD-10-CM | POA: Diagnosis not present

## 2014-04-07 DIAGNOSIS — E039 Hypothyroidism, unspecified: Secondary | ICD-10-CM | POA: Diagnosis not present

## 2014-04-07 DIAGNOSIS — Z9861 Coronary angioplasty status: Secondary | ICD-10-CM

## 2014-04-07 DIAGNOSIS — M545 Low back pain: Secondary | ICD-10-CM | POA: Diagnosis present

## 2014-04-07 DIAGNOSIS — Z8673 Personal history of transient ischemic attack (TIA), and cerebral infarction without residual deficits: Secondary | ICD-10-CM | POA: Diagnosis not present

## 2014-04-07 DIAGNOSIS — G8929 Other chronic pain: Secondary | ICD-10-CM | POA: Diagnosis not present

## 2014-04-07 DIAGNOSIS — I200 Unstable angina: Secondary | ICD-10-CM | POA: Diagnosis present

## 2014-04-07 DIAGNOSIS — I10 Essential (primary) hypertension: Secondary | ICD-10-CM | POA: Diagnosis not present

## 2014-04-07 DIAGNOSIS — I2510 Atherosclerotic heart disease of native coronary artery without angina pectoris: Secondary | ICD-10-CM

## 2014-04-07 DIAGNOSIS — I2109 ST elevation (STEMI) myocardial infarction involving other coronary artery of anterior wall: Secondary | ICD-10-CM | POA: Diagnosis not present

## 2014-04-07 DIAGNOSIS — I214 Non-ST elevation (NSTEMI) myocardial infarction: Secondary | ICD-10-CM | POA: Diagnosis not present

## 2014-04-07 DIAGNOSIS — I25110 Atherosclerotic heart disease of native coronary artery with unstable angina pectoris: Secondary | ICD-10-CM | POA: Diagnosis not present

## 2014-04-07 DIAGNOSIS — I252 Old myocardial infarction: Secondary | ICD-10-CM | POA: Diagnosis not present

## 2014-04-07 DIAGNOSIS — I639 Cerebral infarction, unspecified: Secondary | ICD-10-CM | POA: Diagnosis present

## 2014-04-07 DIAGNOSIS — Z6832 Body mass index (BMI) 32.0-32.9, adult: Secondary | ICD-10-CM

## 2014-04-07 DIAGNOSIS — R079 Chest pain, unspecified: Secondary | ICD-10-CM | POA: Diagnosis not present

## 2014-04-07 DIAGNOSIS — N289 Disorder of kidney and ureter, unspecified: Secondary | ICD-10-CM

## 2014-04-07 DIAGNOSIS — F17200 Nicotine dependence, unspecified, uncomplicated: Secondary | ICD-10-CM | POA: Diagnosis present

## 2014-04-07 DIAGNOSIS — E669 Obesity, unspecified: Secondary | ICD-10-CM | POA: Diagnosis present

## 2014-04-07 DIAGNOSIS — Z720 Tobacco use: Secondary | ICD-10-CM

## 2014-04-07 DIAGNOSIS — F1410 Cocaine abuse, uncomplicated: Secondary | ICD-10-CM | POA: Diagnosis not present

## 2014-04-07 DIAGNOSIS — R0602 Shortness of breath: Secondary | ICD-10-CM | POA: Diagnosis not present

## 2014-04-07 HISTORY — PX: LEFT HEART CATHETERIZATION WITH CORONARY ANGIOGRAM: SHX5451

## 2014-04-07 HISTORY — PX: PERCUTANEOUS CORONARY STENT INTERVENTION (PCI-S): SHX5485

## 2014-04-07 LAB — I-STAT CHEM 8, ED
BUN: 24 mg/dL — ABNORMAL HIGH (ref 6–23)
CHLORIDE: 102 mmol/L (ref 96–112)
Calcium, Ion: 1.13 mmol/L (ref 1.12–1.23)
Creatinine, Ser: 1.4 mg/dL — ABNORMAL HIGH (ref 0.50–1.35)
Glucose, Bld: 120 mg/dL — ABNORMAL HIGH (ref 70–99)
HCT: 41 % (ref 39.0–52.0)
Hemoglobin: 13.9 g/dL (ref 13.0–17.0)
POTASSIUM: 4.2 mmol/L (ref 3.5–5.1)
Sodium: 138 mmol/L (ref 135–145)
TCO2: 22 mmol/L (ref 0–100)

## 2014-04-07 LAB — COMPREHENSIVE METABOLIC PANEL
ALK PHOS: 54 U/L (ref 39–117)
ALT: 21 U/L (ref 0–53)
ANION GAP: 9 (ref 5–15)
AST: 23 U/L (ref 0–37)
Albumin: 3.7 g/dL (ref 3.5–5.2)
BUN: 21 mg/dL (ref 6–23)
CALCIUM: 8.9 mg/dL (ref 8.4–10.5)
CO2: 26 mmol/L (ref 19–32)
CREATININE: 1.42 mg/dL — AB (ref 0.50–1.35)
Chloride: 101 mmol/L (ref 96–112)
GFR calc non Af Amer: 56 mL/min — ABNORMAL LOW (ref >90)
GFR, EST AFRICAN AMERICAN: 65 mL/min — AB (ref >90)
GLUCOSE: 118 mg/dL — AB (ref 70–99)
Potassium: 4.3 mmol/L (ref 3.5–5.1)
Sodium: 136 mmol/L (ref 135–145)
Total Bilirubin: 0.5 mg/dL (ref 0.3–1.2)
Total Protein: 7.4 g/dL (ref 6.0–8.3)

## 2014-04-07 LAB — CBC
HEMATOCRIT: 39.2 % (ref 39.0–52.0)
HEMOGLOBIN: 12.2 g/dL — AB (ref 13.0–17.0)
MCH: 28.3 pg (ref 26.0–34.0)
MCHC: 31.1 g/dL (ref 30.0–36.0)
MCV: 91 fL (ref 78.0–100.0)
Platelets: 341 10*3/uL (ref 150–400)
RBC: 4.31 MIL/uL (ref 4.22–5.81)
RDW: 13.6 % (ref 11.5–15.5)
WBC: 9.1 10*3/uL (ref 4.0–10.5)

## 2014-04-07 LAB — I-STAT TROPONIN, ED: TROPONIN I, POC: 0.01 ng/mL (ref 0.00–0.08)

## 2014-04-07 LAB — RAPID URINE DRUG SCREEN, HOSP PERFORMED
Amphetamines: NOT DETECTED
BARBITURATES: NOT DETECTED
Benzodiazepines: POSITIVE — AB
COCAINE: NOT DETECTED
OPIATES: POSITIVE — AB
TETRAHYDROCANNABINOL: NOT DETECTED

## 2014-04-07 LAB — APTT: aPTT: 31 seconds (ref 24–37)

## 2014-04-07 LAB — PROTIME-INR
INR: 0.89 (ref 0.00–1.49)
Prothrombin Time: 12.1 seconds (ref 11.6–15.2)

## 2014-04-07 LAB — MRSA PCR SCREENING: MRSA by PCR: POSITIVE — AB

## 2014-04-07 LAB — POCT ACTIVATED CLOTTING TIME: ACTIVATED CLOTTING TIME: 411 s

## 2014-04-07 SURGERY — LEFT HEART CATHETERIZATION WITH CORONARY ANGIOGRAM
Anesthesia: LOCAL

## 2014-04-07 MED ORDER — METOPROLOL TARTRATE 100 MG PO TABS
100.0000 mg | ORAL_TABLET | Freq: Two times a day (BID) | ORAL | Status: DC
  Filled 2014-04-07: qty 1

## 2014-04-07 MED ORDER — LOSARTAN POTASSIUM 50 MG PO TABS: 100.0000 mg | ORAL_TABLET | Freq: Every day | ORAL | Status: DC

## 2014-04-07 MED ORDER — CLONIDINE HCL 0.1 MG PO TABS
0.1000 mg | ORAL_TABLET | Freq: Two times a day (BID) | ORAL | Status: DC
  Administered 2014-04-07: 0.1 mg via ORAL
  Filled 2014-04-07 (×2): qty 1

## 2014-04-07 MED ORDER — MIDAZOLAM HCL 2 MG/2ML IJ SOLN
INTRAMUSCULAR | Status: AC
  Filled 2014-04-07: qty 2

## 2014-04-07 MED ORDER — NITROGLYCERIN IN D5W 200-5 MCG/ML-% IV SOLN
10.0000 ug/min | INTRAVENOUS | Status: DC
  Administered 2014-04-07: 33.333 ug/min via INTRAVENOUS
  Filled 2014-04-07: qty 250

## 2014-04-07 MED ORDER — CLOPIDOGREL BISULFATE 300 MG PO TABS
ORAL_TABLET | ORAL | Status: AC
  Filled 2014-04-07: qty 1

## 2014-04-07 MED ORDER — ACETAMINOPHEN-CODEINE #2 300-15 MG PO TABS: 1.0000 | ORAL_TABLET | Freq: Three times a day (TID) | ORAL | Status: DC | PRN

## 2014-04-07 MED ORDER — HYDROMORPHONE HCL 1 MG/ML IJ SOLN
1.0000 mg | INTRAMUSCULAR | Status: DC | PRN
  Administered 2014-04-07: 1 mg via INTRAVENOUS
  Filled 2014-04-07: qty 1

## 2014-04-07 MED ORDER — ONDANSETRON HCL 4 MG/2ML IJ SOLN
4.0000 mg | Freq: Once | INTRAMUSCULAR | Status: AC
  Administered 2014-04-07: 4 mg via INTRAVENOUS
  Filled 2014-04-07: qty 2

## 2014-04-07 MED ORDER — BIVALIRUDIN 250 MG IV SOLR
INTRAVENOUS | Status: AC
  Filled 2014-04-07: qty 250

## 2014-04-07 MED ORDER — ACETAMINOPHEN 325 MG PO TABS
650.0000 mg | ORAL_TABLET | ORAL | Status: DC | PRN
  Filled 2014-04-07: qty 2

## 2014-04-07 MED ORDER — HYDROCODONE-ACETAMINOPHEN 5-325 MG PO TABS
1.0000 | ORAL_TABLET | Freq: Four times a day (QID) | ORAL | Status: DC | PRN
  Administered 2014-04-07 (×2): 2 via ORAL
  Filled 2014-04-07 (×2): qty 2

## 2014-04-07 MED ORDER — SODIUM CHLORIDE 0.9 % IV SOLN
INTRAVENOUS | Status: AC
  Administered 2014-04-07: 11:00:00 via INTRAVENOUS

## 2014-04-07 MED ORDER — SODIUM CHLORIDE 0.9 % IV SOLN
0.2500 mg/kg/h | INTRAVENOUS | Status: AC
  Filled 2014-04-07: qty 250

## 2014-04-07 MED ORDER — CLOPIDOGREL BISULFATE 75 MG PO TABS: 75.0000 mg | ORAL_TABLET | Freq: Every day | ORAL | Status: DC

## 2014-04-07 MED ORDER — BUPROPION HCL 100 MG PO TABS
100.0000 mg | ORAL_TABLET | Freq: Two times a day (BID) | ORAL | Status: DC
  Administered 2014-04-07: 100 mg via ORAL
  Filled 2014-04-07 (×2): qty 1

## 2014-04-07 MED ORDER — CLOPIDOGREL BISULFATE 75 MG PO TABS
75.0000 mg | ORAL_TABLET | Freq: Every day | ORAL | Status: DC
Start: 2014-04-07 — End: 2016-12-07

## 2014-04-07 MED ORDER — PNEUMOCOCCAL VAC POLYVALENT 25 MCG/0.5ML IJ INJ: 0.5000 mL | INJECTION | INTRAMUSCULAR | Status: DC

## 2014-04-07 MED ORDER — LIDOCAINE HCL (PF) 1 % IJ SOLN
INTRAMUSCULAR | Status: AC
  Filled 2014-04-07: qty 30

## 2014-04-07 MED ORDER — NITROGLYCERIN 1 MG/10 ML FOR IR/CATH LAB
INTRA_ARTERIAL | Status: AC
  Filled 2014-04-07: qty 10

## 2014-04-07 MED ORDER — ASPIRIN 81 MG PO CHEW: 81.0000 mg | CHEWABLE_TABLET | Freq: Every day | ORAL | Status: DC

## 2014-04-07 MED ORDER — ATORVASTATIN CALCIUM 80 MG PO TABS
80.0000 mg | ORAL_TABLET | Freq: Every day | ORAL | Status: DC
  Administered 2014-04-07: 80 mg via ORAL
  Filled 2014-04-07: qty 1

## 2014-04-07 MED ORDER — SODIUM CHLORIDE 0.9 % IV SOLN
INTRAVENOUS | Status: DC
  Administered 2014-04-07: 07:00:00 via INTRAVENOUS

## 2014-04-07 MED ORDER — ONDANSETRON HCL 4 MG/2ML IJ SOLN: 4.0000 mg | Freq: Four times a day (QID) | INTRAMUSCULAR | Status: DC | PRN

## 2014-04-07 MED ORDER — HEPARIN (PORCINE) IN NACL 2-0.9 UNIT/ML-% IJ SOLN
INTRAMUSCULAR | Status: AC
  Filled 2014-04-07: qty 1000

## 2014-04-07 MED ORDER — HYDRALAZINE HCL 50 MG PO TABS
50.0000 mg | ORAL_TABLET | Freq: Three times a day (TID) | ORAL | Status: DC
  Administered 2014-04-07 (×2): 50 mg via ORAL
  Filled 2014-04-07 (×3): qty 1

## 2014-04-07 MED ORDER — HEPARIN SODIUM (PORCINE) 5000 UNIT/ML IJ SOLN
4000.0000 [IU] | INTRAMUSCULAR | Status: AC
  Administered 2014-04-07: 4000 [IU] via INTRAVENOUS
  Filled 2014-04-07: qty 1

## 2014-04-07 MED ORDER — PANTOPRAZOLE SODIUM 40 MG PO TBEC
40.0000 mg | DELAYED_RELEASE_TABLET | Freq: Every day | ORAL | Status: DC
  Administered 2014-04-07: 40 mg via ORAL
  Filled 2014-04-07: qty 1

## 2014-04-07 MED ORDER — ZOLPIDEM TARTRATE 5 MG PO TABS: 5.0000 mg | ORAL_TABLET | Freq: Every day | ORAL | Status: DC

## 2014-04-07 MED ORDER — FENTANYL CITRATE 0.05 MG/ML IJ SOLN
INTRAMUSCULAR | Status: AC
  Filled 2014-04-07: qty 2

## 2014-04-07 MED ORDER — LEVOTHYROXINE SODIUM 50 MCG PO TABS
50.0000 ug | ORAL_TABLET | Freq: Every day | ORAL | Status: DC
  Administered 2014-04-07: 50 ug via ORAL
  Filled 2014-04-07 (×2): qty 1

## 2014-04-07 MED ORDER — NITROGLYCERIN 0.4 MG SL SUBL: 0.4000 mg | SUBLINGUAL_TABLET | SUBLINGUAL | Status: DC | PRN

## 2014-04-07 NOTE — Progress Notes (Signed)
Paged by nurse as patient is leaving AMA. I talked with patient regarding risk of leaving AMA so soon after PCI. He insisted on leaving despite multiple attempt to convince him to reverse his decision given risk of MI, stroke, and death. As he has new DES placed in distal RCA, i have given him 1 yr paper Rx of plavix to take with him.  I have instructed to call cardiology for any discomfort.   Ramond DialSigned, Collette Pescador PA Pager: [phone]

## 2014-04-07 NOTE — ED Notes (Addendum)
Per EMS: Pt reports at about 0400 patient started having sharp/stabbing CP with radiation to L arm. Pt took NTG and tums, no relief with those medications. Patient did feel relief in a sitting position. Hx of multiple MIs, CVA, and PE. Pt did report some nausea, denied SOB. Per patient's wife, abdomen is normally distended. Pt was administered 324 asa, 4 NTG, 4 mg of morphine. Ax4 at this time.
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
## 2014-04-07 NOTE — CV Procedure (Addendum)
PROCEDURE:  Left heart catheterization with selective coronary angiography, PCI RCA.  INDICATIONS:  NSTEMI/unstable angina  The risks, benefits, and details of the procedure were explained to the patient.  The patient verbalized understanding and wanted to proceed. Emergency consent was obtained.  PROCEDURE TECHNIQUE:  After Xylocaine anesthesia a 85F sheath was placed in the right femoral artery with a single anterior needle wall stick.   Left coronary angiography was done using a Judkins L4 guide catheter.  Right coronary angiography was done using a Judkins R4 guide catheter.  Left heart cath was done using a pigtail catheter.  Manual compression for hemostasis.   CONTRAST:  Total of 125 cc.  COMPLICATIONS:  None.    HEMODYNAMICS:  Aortic pressure was 119/82; LV pressure was 118/2; LVEDP 14.  There was no gradient between the left ventricle and aorta.    ANGIOGRAPHIC DATA:   The left main coronary artery is is widely patent.  The left anterior descending artery is a large vessel which wraps around the apex. There is mild disease proximally.  The stent in the mid vessel is widely patent the first diagonal is medium-sized and patent. The mid to distal LAD is large and widely patent.  There are faint collaterals from the distal LAD to the PDA distribution.  The left circumflex artery is there is a small ramus vessel which is patent.  The circumflex is a large vessel. There is mild disease diffusely. The first obtuse marginal is large and widely patent. The second obtuse marginal is large and widely patent.  The right coronary artery is a large dominant vessel.  There is mild disease proximally. In the distal vessel, there is an area of ectasia. Just past the ectasia, there is a 70%, hazy stenosis which is new from prior. Just past that, there is an 80% stenosis which is also hazy. There is a small posterior descending artery which appears to be filling retrograde from right to right  collaterals.  In reviewing the prior pictures, this branch was not visualized at all. It looks like there are now collaterals filling this branch which were not present in 2014.  The posterior lateral artery is large and has mild disease.  LEFT VENTRICULOGRAM:  Left ventricular angiogram was not done.  LVEDP was  MmHg.  PCI NARRATIVE: A JR4 guide catheter was used to engage the RCA. A pro-water wire was placed across the area of disease in the distal RCA. A BMW wire was then advanced to try and wire into the PDA which was ghosting in. We were unable to enter the PDA with a BMW. It appeared that this was likely a chronic occlusion. A 2.5 x 15 balloon was then advanced and used to predilate the areas of disease in the distal RCA. A 3.0 x 28 Synergy drug-eluting stent was then deployed at high pressure. The stent was postdilated with a 4.0 x 15 noncompliant balloon in the more proximal and mid portion of the stent. IC nitroglycerin was given. There was an excellent angiographic result. There was no residual stenosis.  IMPRESSIONS:  1. Normal left main coronary artery. 2. Mild disease in the left anterior descending artery and its branches.  Patent stent in the mid LAD. 3. Mild disease in the left circumflex artery and its branches. 4. Severe disease in the distal right coronary artery which had progressed since the prior cath. Chronically occluded PDA branch which fills from left to right and right to right collaterals. Visualization of this  vessel is new from 2014. It appears that the PDA was occluded at that time without collaterals. 5. Successful PCI of the distal RCA with a 3.0 x 28 Synergy drug-eluting stent, postdilated to greater than 4 mm in diameter proximally. 6. Left ventricular systolic function not assessed.  LVEDP 14 mmHg.    RECOMMENDATION:  Continue dual antiplatelet therapy for at least a year. Will continue IV Angiomax at the reduced rate for 3 hours postprocedure. He will need  aggressive secondary prevention including high-dose statin and beta blocker. I discussed the case with Dr. Anne FuSkains, who is the patient's regular cardiologist. He felt the patient would be better served with a drug-eluting stent. He will need close follow-up and risk factor modification. He'll be watched in the ICU.  I don't think his ECG today was acute. It was very similar to prior ECG. He did not have any significant disease in his LAD. I suspect this is more of a non-STEMI although some of his pain may be noncardiac. His history is difficult to obtain. I expect he'll be in the hospital for several days.
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
## 2014-04-07 NOTE — ED Notes (Signed)
Cardiologist at bedside.  States will go to cath lab.

## 2014-04-07 NOTE — CV Procedure (Signed)
D/C 6 Fr Right Femoral sheath per MD orders and unit guidelines. No complaints from patient during or post sheath pull. Covered site with dry sterile gauze and secured with white medipore tape. Pedal Pulses palpable prior, during, and post sheath pull. Post instructions given to patient and patient repeated instructions verbally. 2900 RN made aware of site and will continue care. Patient vitals: B/P 115-143/80-90 HR 60s 02 via RM Air 96%. Patient tolerated procedure well. No signs of bleeding or swelling anywhere on or near the site.

## 2014-04-07 NOTE — Progress Notes (Signed)
Orthopedic Tech Progress Note Patient Details:  Austin Simmons 10/07/1960 [id number] Applied to RLE to keep patient from bending leg (sheath placed in leg) Ortho Devices Type of Ortho Device: Knee Immobilizer Ortho Device/Splint Location: RLE Ortho Device/Splint Interventions: Application   [name] 04/07/2014, 12:02 PM

## 2014-04-07 NOTE — ED Notes (Signed)
X-ray at bedside

## 2014-04-07 NOTE — ED Notes (Addendum)
Pt reports pain started at 0100 opposed to 0400 that EMS stated.

## 2014-04-07 NOTE — Progress Notes (Signed)
Called to see by RN, pt calling out in pain, says his Lt arm is swollen. When I walked in the room he was calm and in no distress. His arm is not swollen or discolored. EKG shows no acute changes. Difficult pt with possibly some psychiatric issues, prior stroke, and chronic pain. Pain medication has been ordered. Will make sure he has TSH ordered as he has a history of non compliance with Synthroid.  Austin ShelterLUKE KILROY PA-C 04/07/2014 5:28 PM  I have reviewed assessment and plan.  Agree with above as stated.  Patient has had these sx even during the cardiac cath.  Difficult to get a full history.  Continue to monitor.   Austin Simmons S.

## 2014-04-07 NOTE — Progress Notes (Signed)
Pt states that he is "getting out of here." Pt states he wants to leave. MD notified, PA Marlyne BeardsWeng is at the bedside. Pt was educated on the importance of taking his Plavix. Pt was given a year's worth prescription for Plavix. Pt was told by PA how dangerous is was for him to leave AMA. Pt is persistent and states that his son is coming to pick him up. IVs removed, and IV fluids and IV nitro stopped. AMA papered sign and in the chart.

## 2014-04-07 NOTE — Care Management Note (Signed)
    Page 1 of 1   04/07/2014     9:53:17 AM CARE MANAGEMENT NOTE 04/07/2014  Patient:  Austin Simmons,Austin Simmons   Account Number:  [id number]  Date Initiated:  04/07/2014  Documentation initiated by:  Junius CreamerWELL,DEBBIE  Subjective/Objective Assessment:   adm wnstemi     Action/Plan:   lives at home, pcp dr deepak Orpah Cobbadvani   Anticipated DC Date:     Anticipated DC Plan:  HOME/SELF CARE         Choice offered to / List presented to:             Status of service:   Medicare Important Message given?   (If response is "NO", the following Medicare IM given date fields will be blank) Date Medicare IM given:   Medicare IM given by:   Date Additional Medicare IM given:   Additional Medicare IM given by:    Discharge Disposition:    Per UR Regulation:  Reviewed for med. necessity/level of care/duration of stay  If discussed at Long Length of Stay Meetings, dates discussed:    Comments:

## 2014-04-08 MED FILL — Sodium Chloride IV Soln 0.9%: INTRAVENOUS | Qty: 50 | Status: AC

## 2014-04-11 ENCOUNTER — Encounter (HOSPITAL_COMMUNITY): Payer: Self-pay | Admitting: Physician Assistant

## 2014-04-11 NOTE — Discharge Summary (Signed)
Discharge Summary/Patient left AMA (Against Medical Advise)   Patient ID: Austin Simmons,  MRN: [id number], DOB/AGE: 1961-01-15 54 y.o.  Admit date: 04/07/2014 Discharge date: 04/11/2014  Primary Care Provider: Doris CheadleADVANI, DEEPAK Primary Cardiologist: Dr. Anne FuSkains  Discharge Diagnoses Principal Problem:   Unstable angina- urgent RCA DES 04/07/14 Active Problems:   Cocaine abuse   Hypertension   Ischemic cardiomyopathy   CAD S/P LAD BMS 2014   HLD (hyperlipidemia)   Stroke   Hypothyroidism   Smoking   Chronic lower back pain   Obesity (BMI 30.0-34.9)   Renal insufficiency- stage 3   Allergies Allergies  Allergen Reactions  . Penicillins Rash         Procedures  Cardiac catheterization 04/07/2014 IMPRESSIONS:  1. Normal left main coronary artery. 2. Mild disease in the left anterior descending artery and its branches. Patent stent in the mid LAD. 3. Mild disease in the left circumflex artery and its branches. 4. Severe disease in the distal right coronary artery which had progressed since the prior cath. Chronically occluded PDA branch which fills from left to right and right to right collaterals. Visualization of this vessel is new from 2014. It appears that the PDA was occluded at that time without collaterals. 5. Successful PCI of the distal RCA with a 3.0 x 28 Synergy drug-eluting stent, postdilated to greater than 4 mm in diameter proximally. 6. Left ventricular systolic function not assessed. LVEDP 14 mmHg.  RECOMMENDATION: Continue dual antiplatelet therapy for at least a year. Will continue IV Angiomax at the reduced rate for 3 hours postprocedure. He will need aggressive secondary prevention including high-dose statin and beta blocker. I discussed the case with Dr. Anne FuSkains, who is the patient's regular cardiologist. He felt the patient would be better served with a drug-eluting stent. He will need close follow-up and risk factor modification. He'll be watched in the  ICU.  I don't think his ECG today was acute. It was very similar to prior ECG. He did not have any significant disease in his LAD. I suspect this is more of a non-STEMI although some of his pain may be noncardiac. His history is difficult to obtain. I expect he'll be in the hospital for several days.    Hospital Course  54 yo male with h/o of previous MI in the setting of cocaine use, HTN, tobacco abuse, CAD, chronic back pain and prior CVA with residual dysphasia. His last myoview in 2014 showed EF 25% with inferior scar with peri-infarct ischemia. LHC demonstrated high grade disease in mid LAD which was treated with BMS. He has documented h/o noncompliance. He presented to ED on 04/07/2014 with chest pain. EKG showed LVH with suggestion of AS ST elevation. POC was negative. He was taken to cath lab for urgent cardiac catheterization.  Cath showed severe distal RCA disease with chronically occluded PDA branch with L to R and R to R collaterals. Distal RCA branch was treated with 3.0 x 28 Synergy DES postdilated to >484mm. According to Dr. Eldridge DaceVaranasi, his EKG was unlikely to be acute and felt to be very similar to prior EKG. Dr. Eldridge DaceVaranasi discussed the case with Dr. Anne FuSkains who is the patient's primary cardiologist who felt he is best served with DES. Post-cath, patient was transferred to CCU for recovery. Post cath, patient was calling out in pain saying his L arm was swollen, however upon provider interview, he was calm and in no distress. EKG showed no acute changes. He was treated with pain medication.   Overnight,  on call PA was paged by nurse as patient is insisting on leaving AMA. On my arrival of patient's room, he was already fully dressed and AMA form already signed prior to my arrival. I have discussed with patient the risk of premature departure without overnight observation after invasive procedure such as cardiac cath, include MI, stroke and possibly death. Despite multiple attempt, patient states  he had MI and strokes before, he wish to leave anyway. Since he just received DES, prior to his departure from the floor, I have written for him 1 year Rx of plavix which was listed as one of his previous home medication. I have discussed with him the absolute need to be compliant with medication esp DAPT given his new stent and he should contact cardiology if has any discomfort. I will send staff email to Dr. Minerva Fester office scheduler to arrange close followup. However, understandably this is a difficult patient with long standing h/o noncompliance.    Discharge Vitals Blood pressure 156/102, pulse 75, temperature 98.4 F (36.9 C), temperature source Oral, resp. rate 26, height  (1.702 m), weight 208 lb (94.348 kg), SpO2 96 %.  Filed Weights   04/07/14 0651  Weight: 208 lb (94.348 kg)    Labs CBC    Component Value Date/Time   WBC 9.1 04/07/2014 0650   RBC 4.31 04/07/2014 0650   HGB 13.9 04/07/2014 0652   HCT 41.0 04/07/2014 0652   PLT 341 04/07/2014 0650   MCV 91.0 04/07/2014 0650   MCH 28.3 04/07/2014 0650   MCHC 31.1 04/07/2014 0650   RDW 13.6 04/07/2014 0650   LYMPHSABS 2.6 02/24/2014 1106   MONOABS 1.2* 02/24/2014 1106   EOSABS 0.3 02/24/2014 1106   BASOSABS 0.1 02/24/2014 1106    BMET    Component Value Date/Time   NA 138 04/07/2014 0652   K 4.2 04/07/2014 0652   CL 102 04/07/2014 0652   CO2 26 04/07/2014 0650   GLUCOSE 120* 04/07/2014 0652   BUN 24* 04/07/2014 0652   CREATININE 1.40* 04/07/2014 0652   CREATININE 1.25 10/25/2013 1512   CALCIUM 8.9 04/07/2014 0650   GFRNONAA 56* 04/07/2014 0650   GFRNONAA 66 10/25/2013 1512   GFRAA 65* 04/07/2014 0650   GFRAA 77 10/25/2013 1512     Disposition  Patient left the hospital AMA despite multiple attempt at persuade him and discussing with him the risk of leaving AMA  Follow-up Plans & Appointments  Will contact scheduler who will contact patient to arrange followup  Discharge Medications  Given the  urgent nature and the way patient left AMA, only plavix  30 days with 11 refill was given to the patient.     Medication List    TAKE these medications        clopidogrel 75 MG tablet  Commonly known as:  PLAVIX  Take 1 tablet (75 mg total) by mouth daily with breakfast.      ASK your doctor about these medications        acetaminophen-codeine 300-15 MG per tablet  Commonly known as:  TYLENOL #2  Take 1 tablet by mouth every 8 (eight) hours as needed for moderate pain.     Amantadine HCl 100 MG tablet  Take 100 mg by mouth 2 (two) times daily.     aspirin 81 MG tablet  Take 81 mg by mouth daily.     atorvastatin 80 MG tablet  Commonly known as:  LIPITOR  TAKE 1 TABLET BY MOUTH DAILY AT 6  PM     baclofen 10 MG tablet  Commonly known as:  LIORESAL  Take 1 tablet (10 mg total) by mouth at bedtime.     buPROPion 100 MG tablet  Commonly known as:  WELLBUTRIN  Take 1 tablet (100 mg total) by mouth 2 (two) times daily.     cloNIDine 0.1 MG tablet  Commonly known as:  CATAPRES  TAKE 1 TABLET BY MOUTH TWICE DAILY     hydrALAZINE 25 MG tablet  Commonly known as:  APRESOLINE  Take 1 tablet (25 mg total) by mouth 3 (three) times daily.     hydrALAZINE 50 MG tablet  Commonly known as:  APRESOLINE  Take 50 mg by mouth 3 (three) times daily.     levothyroxine 50 MCG tablet  Commonly known as:  SYNTHROID, LEVOTHROID  Take 1 tablet (50 mcg total) by mouth daily.     losartan 100 MG tablet  Commonly known as:  COZAAR  Take 1 tablet (100 mg total) by mouth daily.     metoprolol 100 MG tablet  Commonly known as:  LOPRESSOR  TAKE 1 TABLET BY MOUTH TWICE DAILY     nitroGLYCERIN 0.4 MG SL tablet  Commonly known as:  NITROSTAT  Place 1 tablet (0.4 mg total) under the tongue every 5 (five) minutes as needed. Chest pain     pantoprazole 40 MG tablet  Commonly known as:  PROTONIX  Take 1 tablet (40 mg total) by mouth daily.     spironolactone 25 MG tablet  Commonly known  as:  ALDACTONE  TAKE 1 TABLET BY MOUTH EVERY DAY     traMADol 50 MG tablet  Commonly known as:  ULTRAM  Take 1 tablet (50 mg total) by mouth every 8 (eight) hours as needed for moderate pain.     zolpidem 10 MG tablet  Commonly known as:  AMBIEN        Duration of Discharge Encounter   Greater than 30 minutes including physician time.  Ramond Dial PA-C Pager: [phone] 04/11/2014, 7:28 PM   I have examined the patient and reviewed assessment and plan and discussed with patient.  Agree with above as stated.  I suspect his pain after the procedure was noncardiac given the normal ECG.  The nurse suspected that he was just trying to get more narcotics.  Despite giving Dilaudid, he decided to leave AMA.  We stressed the importance of Plavix.  He took Plavix in the past.  He was given a prescription.  WIll try to get f/u with Dr. Anne Fu who is aware that the patient had a DES placed today.    Kylor Valverde S.

## 2014-05-24 ENCOUNTER — Ambulatory Visit (INDEPENDENT_AMBULATORY_CARE_PROVIDER_SITE_OTHER): Payer: Medicare Other | Admitting: Cardiology

## 2014-05-24 ENCOUNTER — Encounter: Payer: Self-pay | Admitting: Cardiology

## 2014-05-24 VITALS — BP 126/90 | HR 62 | Ht 67.0 in | Wt 206.0 lb

## 2014-05-24 DIAGNOSIS — Z79899 Other long term (current) drug therapy: Secondary | ICD-10-CM | POA: Diagnosis not present

## 2014-05-24 DIAGNOSIS — I10 Essential (primary) hypertension: Secondary | ICD-10-CM

## 2014-05-24 LAB — BASIC METABOLIC PANEL
BUN: 16 mg/dL (ref 6–23)
CO2: 30 mEq/L (ref 19–32)
CREATININE: 1.37 mg/dL (ref 0.40–1.50)
Calcium: 9.5 mg/dL (ref 8.4–10.5)
Chloride: 101 mEq/L (ref 96–112)
GFR: 58.08 mL/min — AB (ref >60.00)
Glucose, Bld: 97 mg/dL (ref 70–99)
Potassium: 4.4 mEq/L (ref 3.5–5.1)
Sodium: 136 mEq/L (ref 135–145)

## 2014-05-24 NOTE — Patient Instructions (Signed)
Medication Instructions:  Your physician recommends that you continue on your current medications as directed. Please refer to the Current Medication list given to you today.  Labwork: Please have lab today (BMP)  Testing/Procedures: none  Follow-Up: Follow up in 4 months with Dr Anne FuSkains.  Thank you for choosing [facility]!!
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
## 2014-08-26 ENCOUNTER — Other Ambulatory Visit: Payer: Self-pay | Admitting: *Deleted

## 2014-08-26 DIAGNOSIS — I5022 Chronic systolic (congestive) heart failure: Secondary | ICD-10-CM

## 2014-08-26 MED ORDER — HYDRALAZINE HCL 25 MG PO TABS: 25.0000 mg | ORAL_TABLET | Freq: Three times a day (TID) | ORAL | Status: DC

## 2014-09-06 ENCOUNTER — Ambulatory Visit: Payer: Medicare Other | Admitting: Cardiology

## 2014-09-07 ENCOUNTER — Encounter: Payer: Self-pay | Admitting: Cardiology

## 2015-12-28 ENCOUNTER — Encounter (HOSPITAL_COMMUNITY): Payer: Self-pay

## 2015-12-28 ENCOUNTER — Emergency Department (HOSPITAL_COMMUNITY): Payer: Medicare Other

## 2015-12-28 ENCOUNTER — Emergency Department (HOSPITAL_COMMUNITY)
Admission: EM | Admit: 2015-12-28 | Discharge: 2015-12-29 | Disposition: A | Discharge status: Home / Self Care | Payer: Medicare Other | Attending: Emergency Medicine | Admitting: Emergency Medicine

## 2015-12-28 DIAGNOSIS — Z8673 Personal history of transient ischemic attack (TIA), and cerebral infarction without residual deficits: Secondary | ICD-10-CM | POA: Insufficient documentation

## 2015-12-28 DIAGNOSIS — Z87891 Personal history of nicotine dependence: Secondary | ICD-10-CM | POA: Insufficient documentation

## 2015-12-28 DIAGNOSIS — Z955 Presence of coronary angioplasty implant and graft: Secondary | ICD-10-CM | POA: Diagnosis not present

## 2015-12-28 DIAGNOSIS — R079 Chest pain, unspecified: Secondary | ICD-10-CM | POA: Diagnosis not present

## 2015-12-28 DIAGNOSIS — M79602 Pain in left arm: Secondary | ICD-10-CM | POA: Diagnosis not present

## 2015-12-28 DIAGNOSIS — E039 Hypothyroidism, unspecified: Secondary | ICD-10-CM | POA: Diagnosis not present

## 2015-12-28 DIAGNOSIS — I2510 Atherosclerotic heart disease of native coronary artery without angina pectoris: Secondary | ICD-10-CM | POA: Diagnosis not present

## 2015-12-28 DIAGNOSIS — I252 Old myocardial infarction: Secondary | ICD-10-CM | POA: Insufficient documentation

## 2015-12-28 DIAGNOSIS — I10 Essential (primary) hypertension: Secondary | ICD-10-CM | POA: Diagnosis not present

## 2015-12-28 DIAGNOSIS — Z79899 Other long term (current) drug therapy: Secondary | ICD-10-CM | POA: Diagnosis not present

## 2015-12-28 LAB — BASIC METABOLIC PANEL
Anion gap: 6 (ref 5–15)
BUN: 29 mg/dL — ABNORMAL HIGH (ref 6–20)
CALCIUM: 8.2 mg/dL — AB (ref 8.9–10.3)
CO2: 23 mmol/L (ref 22–32)
CREATININE: 1.84 mg/dL — AB (ref 0.61–1.24)
Chloride: 108 mmol/L (ref 101–111)
GFR, EST AFRICAN AMERICAN: 47 mL/min — AB (ref >60)
GFR, EST NON AFRICAN AMERICAN: 40 mL/min — AB (ref >60)
GLUCOSE: 101 mg/dL — AB (ref 65–99)
Potassium: 4.1 mmol/L (ref 3.5–5.1)
Sodium: 137 mmol/L (ref 135–145)

## 2015-12-28 LAB — CBC WITH DIFFERENTIAL/PLATELET
BASOS ABS: 0 10*3/uL (ref 0.0–0.1)
BASOS PCT: 0 %
EOS ABS: 0.1 10*3/uL (ref 0.0–0.7)
EOS PCT: 1 %
HCT: 35.5 % — ABNORMAL LOW (ref 39.0–52.0)
HEMOGLOBIN: 11 g/dL — AB (ref 13.0–17.0)
Lymphocytes Relative: 27 %
Lymphs Abs: 2.4 10*3/uL (ref 0.7–4.0)
MCH: 28.1 pg (ref 26.0–34.0)
MCHC: 31 g/dL (ref 30.0–36.0)
MCV: 90.8 fL (ref 78.0–100.0)
Monocytes Absolute: 0.9 10*3/uL (ref 0.1–1.0)
Monocytes Relative: 10 %
NEUTROS PCT: 62 %
Neutro Abs: 5.6 10*3/uL (ref 1.7–7.7)
PLATELETS: 233 10*3/uL (ref 150–400)
RBC: 3.91 MIL/uL — AB (ref 4.22–5.81)
RDW: 14.2 % (ref 11.5–15.5)
WBC: 9 10*3/uL (ref 4.0–10.5)

## 2015-12-28 LAB — ETHANOL: Alcohol, Ethyl (B): <5 mg/dL (ref <5)

## 2015-12-28 LAB — I-STAT TROPONIN, ED: TROPONIN I, POC: 0.01 ng/mL (ref 0.00–0.08)

## 2015-12-28 MED ORDER — ALPRAZOLAM 0.25 MG PO TABS
1.0000 mg | ORAL_TABLET | Freq: Once | ORAL | Status: AC
  Administered 2015-12-28: 1 mg via ORAL
  Filled 2015-12-28: qty 4

## 2015-12-28 NOTE — ED Triage Notes (Signed)
Pt arrives via EMS c/o Chest pain and SHOB. Pt states he is having left hand and finger pain. Pt currently yelling in the room and EMS reports he has been uncoorperative with them en route. EMS gave the pt 6mg  of morphine, 0.4 Nitro and 324mg  Aspirin en route. Pt has hx of cardiac cath with same presenting symptoms as today.

## 2015-12-28 NOTE — ED Notes (Signed)
RN  notified by radiology that pt was unable to stay still for exam and when asked what his name and DOB was he replied "i dont know" Pt returned from radiology and RN will notifiy radiology when pt has calmed down. Phlebotomy at bedside to attempt to get labs.

## 2015-12-28 NOTE — ED Notes (Signed)
Pt ambulatory to use the urinal. Pt very unsteady. Pt unable to void. RN assisted pt back into the bed

## 2015-12-28 NOTE — ED Notes (Addendum)
Unable to get a BP due to pt moving

## 2015-12-28 NOTE — ED Notes (Signed)
Pt continues to yell at staff. RN assisted pt back to bed. Pt refusing to cooperate with staff.

## 2015-12-28 NOTE — ED Notes (Signed)
Patient transported to X-ray 
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
## 2015-12-28 NOTE — ED Notes (Signed)
Pt attempted to use urinal. Unable to void. RN assisted pt back to bed.

## 2015-12-29 DIAGNOSIS — R079 Chest pain, unspecified: Secondary | ICD-10-CM | POA: Diagnosis not present

## 2015-12-29 LAB — RAPID URINE DRUG SCREEN, HOSP PERFORMED
Amphetamines: NOT DETECTED
BARBITURATES: NOT DETECTED
Benzodiazepines: NOT DETECTED
Cocaine: NOT DETECTED
Opiates: POSITIVE — AB
Tetrahydrocannabinol: NOT DETECTED

## 2015-12-29 NOTE — Discharge Instructions (Signed)

## 2015-12-29 NOTE — ED Notes (Signed)
Pt continues to get out of bed without assistance. RN to bedside to assist pt to bed.

## 2016-06-14 ENCOUNTER — Emergency Department (HOSPITAL_COMMUNITY): Payer: Medicare Other

## 2016-06-14 ENCOUNTER — Emergency Department (HOSPITAL_COMMUNITY)
Admission: EM | Admit: 2016-06-14 | Discharge: 2016-06-14 | Discharge status: Against Medical Advice | Payer: Medicare Other | Attending: Emergency Medicine | Admitting: Emergency Medicine

## 2016-06-14 ENCOUNTER — Encounter (HOSPITAL_COMMUNITY): Payer: Self-pay | Admitting: Emergency Medicine

## 2016-06-14 DIAGNOSIS — Z87891 Personal history of nicotine dependence: Secondary | ICD-10-CM | POA: Insufficient documentation

## 2016-06-14 DIAGNOSIS — R7989 Other specified abnormal findings of blood chemistry: Secondary | ICD-10-CM | POA: Diagnosis not present

## 2016-06-14 DIAGNOSIS — S0081XA Abrasion of other part of head, initial encounter: Secondary | ICD-10-CM | POA: Insufficient documentation

## 2016-06-14 DIAGNOSIS — Z79899 Other long term (current) drug therapy: Secondary | ICD-10-CM | POA: Diagnosis not present

## 2016-06-14 DIAGNOSIS — Z7982 Long term (current) use of aspirin: Secondary | ICD-10-CM | POA: Diagnosis not present

## 2016-06-14 DIAGNOSIS — Y929 Unspecified place or not applicable: Secondary | ICD-10-CM | POA: Insufficient documentation

## 2016-06-14 DIAGNOSIS — S0990XA Unspecified injury of head, initial encounter: Secondary | ICD-10-CM | POA: Diagnosis present

## 2016-06-14 DIAGNOSIS — I10 Essential (primary) hypertension: Secondary | ICD-10-CM | POA: Diagnosis not present

## 2016-06-14 DIAGNOSIS — Z9119 Patient's noncompliance with other medical treatment and regimen: Secondary | ICD-10-CM | POA: Insufficient documentation

## 2016-06-14 DIAGNOSIS — R402431 Glasgow coma scale score 3-8, in the field [EMT or ambulance]: Secondary | ICD-10-CM | POA: Diagnosis not present

## 2016-06-14 DIAGNOSIS — E039 Hypothyroidism, unspecified: Secondary | ICD-10-CM | POA: Diagnosis not present

## 2016-06-14 DIAGNOSIS — R748 Abnormal levels of other serum enzymes: Secondary | ICD-10-CM | POA: Insufficient documentation

## 2016-06-14 DIAGNOSIS — Y999 Unspecified external cause status: Secondary | ICD-10-CM | POA: Insufficient documentation

## 2016-06-14 DIAGNOSIS — I252 Old myocardial infarction: Secondary | ICD-10-CM | POA: Diagnosis not present

## 2016-06-14 DIAGNOSIS — Z5181 Encounter for therapeutic drug level monitoring: Secondary | ICD-10-CM | POA: Insufficient documentation

## 2016-06-14 DIAGNOSIS — R4182 Altered mental status, unspecified: Secondary | ICD-10-CM | POA: Insufficient documentation

## 2016-06-14 DIAGNOSIS — I2510 Atherosclerotic heart disease of native coronary artery without angina pectoris: Secondary | ICD-10-CM | POA: Insufficient documentation

## 2016-06-14 DIAGNOSIS — Z91199 Patient's noncompliance with other medical treatment and regimen due to unspecified reason: Secondary | ICD-10-CM

## 2016-06-14 DIAGNOSIS — Y939 Activity, unspecified: Secondary | ICD-10-CM | POA: Diagnosis not present

## 2016-06-14 DIAGNOSIS — Z8673 Personal history of transient ischemic attack (TIA), and cerebral infarction without residual deficits: Secondary | ICD-10-CM | POA: Diagnosis not present

## 2016-06-14 DIAGNOSIS — I639 Cerebral infarction, unspecified: Secondary | ICD-10-CM | POA: Diagnosis not present

## 2016-06-14 DIAGNOSIS — X58XXXA Exposure to other specified factors, initial encounter: Secondary | ICD-10-CM | POA: Diagnosis not present

## 2016-06-14 LAB — COMPREHENSIVE METABOLIC PANEL
ALK PHOS: 48 U/L (ref 38–126)
ALT: 16 U/L — ABNORMAL LOW (ref 17–63)
ANION GAP: 9 (ref 5–15)
AST: 26 U/L (ref 15–41)
Albumin: 3.5 g/dL (ref 3.5–5.0)
BUN: 22 mg/dL — ABNORMAL HIGH (ref 6–20)
CALCIUM: 8.4 mg/dL — AB (ref 8.9–10.3)
CO2: 22 mmol/L (ref 22–32)
Chloride: 108 mmol/L (ref 101–111)
Creatinine, Ser: 2.53 mg/dL — ABNORMAL HIGH (ref 0.61–1.24)
GFR calc non Af Amer: 27 mL/min — ABNORMAL LOW (ref >60)
GFR, EST AFRICAN AMERICAN: 32 mL/min — AB (ref >60)
Glucose, Bld: 179 mg/dL — ABNORMAL HIGH (ref 65–99)
Potassium: 3.6 mmol/L (ref 3.5–5.1)
SODIUM: 139 mmol/L (ref 135–145)
TOTAL PROTEIN: 6.5 g/dL (ref 6.5–8.1)
Total Bilirubin: 0.3 mg/dL (ref 0.3–1.2)

## 2016-06-14 LAB — CBC
HCT: 37.9 % — ABNORMAL LOW (ref 39.0–52.0)
Hemoglobin: 11.9 g/dL — ABNORMAL LOW (ref 13.0–17.0)
MCH: 27.9 pg (ref 26.0–34.0)
MCHC: 31.4 g/dL (ref 30.0–36.0)
MCV: 89 fL (ref 78.0–100.0)
PLATELETS: 212 10*3/uL (ref 150–400)
RBC: 4.26 MIL/uL (ref 4.22–5.81)
RDW: 14.7 % (ref 11.5–15.5)
WBC: 5.8 10*3/uL (ref 4.0–10.5)

## 2016-06-14 LAB — I-STAT TROPONIN, ED: Troponin i, poc: 0.03 ng/mL (ref 0.00–0.08)

## 2016-06-14 LAB — PROTIME-INR
INR: 0.96
PROTHROMBIN TIME: 12.7 s (ref 11.4–15.2)

## 2016-06-14 LAB — URINALYSIS, ROUTINE W REFLEX MICROSCOPIC
BILIRUBIN URINE: NEGATIVE
GLUCOSE, UA: 50 mg/dL — AB
HGB URINE DIPSTICK: NEGATIVE
KETONES UR: NEGATIVE mg/dL
NITRITE: NEGATIVE
PROTEIN: 100 mg/dL — AB
Specific Gravity, Urine: 1.017 (ref 1.005–1.030)
pH: 5 (ref 5.0–8.0)

## 2016-06-14 LAB — DIFFERENTIAL
Basophils Absolute: 0 10*3/uL (ref 0.0–0.1)
Basophils Relative: 0 %
EOS PCT: 3 %
Eosinophils Absolute: 0.2 10*3/uL (ref 0.0–0.7)
LYMPHS PCT: 14 %
Lymphs Abs: 0.8 10*3/uL (ref 0.7–4.0)
MONO ABS: 0.4 10*3/uL (ref 0.1–1.0)
Monocytes Relative: 8 %
Neutro Abs: 4.4 10*3/uL (ref 1.7–7.7)
Neutrophils Relative %: 75 %

## 2016-06-14 LAB — I-STAT CHEM 8, ED
BUN: 26 mg/dL — ABNORMAL HIGH (ref 6–20)
CALCIUM ION: 1.07 mmol/L — AB (ref 1.15–1.40)
Chloride: 109 mmol/L (ref 101–111)
Creatinine, Ser: 2.5 mg/dL — ABNORMAL HIGH (ref 0.61–1.24)
Glucose, Bld: 168 mg/dL — ABNORMAL HIGH (ref 65–99)
HCT: 37 % — ABNORMAL LOW (ref 39.0–52.0)
HEMOGLOBIN: 12.6 g/dL — AB (ref 13.0–17.0)
Potassium: 3.7 mmol/L (ref 3.5–5.1)
SODIUM: 142 mmol/L (ref 135–145)
TCO2: 23 mmol/L (ref 0–100)

## 2016-06-14 LAB — RAPID URINE DRUG SCREEN, HOSP PERFORMED
AMPHETAMINES: NOT DETECTED
Barbiturates: NOT DETECTED
Benzodiazepines: POSITIVE — AB
Cocaine: POSITIVE — AB
OPIATES: NOT DETECTED
Tetrahydrocannabinol: NOT DETECTED

## 2016-06-14 LAB — ETHANOL

## 2016-06-14 LAB — APTT: aPTT: 24 seconds (ref 24–36)

## 2016-06-14 MED ORDER — SODIUM CHLORIDE 0.9 % IV BOLUS (SEPSIS): 1000.0000 mL | Freq: Once | INTRAVENOUS | Status: DC
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
## 2016-06-14 NOTE — ED Triage Notes (Signed)
[facility] for unresponsiveness. Per EMS, pt was found by bystanders, cyanotic and breathing 4/min. EMS placed NPA, assisted ventilations and gave 2mg  Narcan, pt then became responsive. Hx of HTN, w/o meds x 2 wks. BP 227/138 on EMS arrival

## 2016-06-14 NOTE — ED Notes (Signed)
Pt leaving AMA; Dr.Ray spoke with pt. Pt verbalizes understanding.

## 2016-06-14 NOTE — Discharge Instructions (Signed)
You should stay in the hospital and receive IV fluids and further evaluation. If you choose to leave, please drink plenty of fluids. Follow-up with your doctor and get your prescriptions filled. Return if you decide you will allow further treatment and evaluation.

## 2016-06-14 NOTE — ED Notes (Signed)
Patient transported to CT 

## 2016-09-15 ENCOUNTER — Encounter (HOSPITAL_COMMUNITY): Payer: Self-pay | Admitting: *Deleted

## 2016-09-15 ENCOUNTER — Inpatient Hospital Stay (HOSPITAL_COMMUNITY)
Admission: EM | Admit: 2016-09-15 | Discharge: 2016-09-25 | DRG: 304 | Discharge status: Against Medical Advice | Payer: Medicare Other | Attending: Internal Medicine | Admitting: Internal Medicine

## 2016-09-15 ENCOUNTER — Emergency Department (HOSPITAL_COMMUNITY): Payer: Medicare Other

## 2016-09-15 DIAGNOSIS — Z23 Encounter for immunization: Secondary | ICD-10-CM

## 2016-09-15 DIAGNOSIS — Z880 Allergy status to penicillin: Secondary | ICD-10-CM

## 2016-09-15 DIAGNOSIS — R4182 Altered mental status, unspecified: Secondary | ICD-10-CM | POA: Diagnosis not present

## 2016-09-15 DIAGNOSIS — Z87891 Personal history of nicotine dependence: Secondary | ICD-10-CM

## 2016-09-15 DIAGNOSIS — J9811 Atelectasis: Secondary | ICD-10-CM | POA: Diagnosis not present

## 2016-09-15 DIAGNOSIS — N184 Chronic kidney disease, stage 4 (severe): Secondary | ICD-10-CM | POA: Diagnosis present

## 2016-09-15 DIAGNOSIS — I71012 Dissection of descending thoracic aorta: Secondary | ICD-10-CM | POA: Diagnosis present

## 2016-09-15 DIAGNOSIS — E876 Hypokalemia: Secondary | ICD-10-CM | POA: Diagnosis not present

## 2016-09-15 DIAGNOSIS — J811 Chronic pulmonary edema: Secondary | ICD-10-CM

## 2016-09-15 DIAGNOSIS — R7989 Other specified abnormal findings of blood chemistry: Secondary | ICD-10-CM

## 2016-09-15 DIAGNOSIS — I119 Hypertensive heart disease without heart failure: Secondary | ICD-10-CM | POA: Diagnosis present

## 2016-09-15 DIAGNOSIS — I43 Cardiomyopathy in diseases classified elsewhere: Secondary | ICD-10-CM | POA: Diagnosis not present

## 2016-09-15 DIAGNOSIS — N179 Acute kidney failure, unspecified: Secondary | ICD-10-CM | POA: Diagnosis present

## 2016-09-15 DIAGNOSIS — X58XXXA Exposure to other specified factors, initial encounter: Secondary | ICD-10-CM | POA: Diagnosis not present

## 2016-09-15 DIAGNOSIS — E8770 Fluid overload, unspecified: Secondary | ICD-10-CM | POA: Diagnosis not present

## 2016-09-15 DIAGNOSIS — G4700 Insomnia, unspecified: Secondary | ICD-10-CM | POA: Diagnosis present

## 2016-09-15 DIAGNOSIS — T508X5A Adverse effect of diagnostic agents, initial encounter: Secondary | ICD-10-CM | POA: Diagnosis not present

## 2016-09-15 DIAGNOSIS — I509 Heart failure, unspecified: Secondary | ICD-10-CM

## 2016-09-15 DIAGNOSIS — Y92239 Unspecified place in hospital as the place of occurrence of the external cause: Secondary | ICD-10-CM

## 2016-09-15 DIAGNOSIS — R109 Unspecified abdominal pain: Secondary | ICD-10-CM

## 2016-09-15 DIAGNOSIS — J90 Pleural effusion, not elsewhere classified: Secondary | ICD-10-CM | POA: Diagnosis not present

## 2016-09-15 DIAGNOSIS — I1310 Hypertensive heart and chronic kidney disease without heart failure, with stage 1 through stage 4 chronic kidney disease, or unspecified chronic kidney disease: Secondary | ICD-10-CM | POA: Diagnosis present

## 2016-09-15 DIAGNOSIS — I7102 Dissection of abdominal aorta: Secondary | ICD-10-CM | POA: Diagnosis not present

## 2016-09-15 DIAGNOSIS — I252 Old myocardial infarction: Secondary | ICD-10-CM

## 2016-09-15 DIAGNOSIS — R0902 Hypoxemia: Secondary | ICD-10-CM | POA: Diagnosis not present

## 2016-09-15 DIAGNOSIS — E871 Hypo-osmolality and hyponatremia: Secondary | ICD-10-CM | POA: Diagnosis present

## 2016-09-15 DIAGNOSIS — Z803 Family history of malignant neoplasm of breast: Secondary | ICD-10-CM

## 2016-09-15 DIAGNOSIS — N183 Chronic kidney disease, stage 3 unspecified: Secondary | ICD-10-CM | POA: Diagnosis present

## 2016-09-15 DIAGNOSIS — K567 Ileus, unspecified: Secondary | ICD-10-CM | POA: Diagnosis not present

## 2016-09-15 DIAGNOSIS — E079 Disorder of thyroid, unspecified: Secondary | ICD-10-CM | POA: Diagnosis present

## 2016-09-15 DIAGNOSIS — R778 Other specified abnormalities of plasma proteins: Secondary | ICD-10-CM

## 2016-09-15 DIAGNOSIS — Z781 Physical restraint status: Secondary | ICD-10-CM

## 2016-09-15 DIAGNOSIS — F410 Panic disorder [episodic paroxysmal anxiety] without agoraphobia: Secondary | ICD-10-CM | POA: Diagnosis present

## 2016-09-15 DIAGNOSIS — I255 Ischemic cardiomyopathy: Secondary | ICD-10-CM | POA: Diagnosis not present

## 2016-09-15 DIAGNOSIS — E785 Hyperlipidemia, unspecified: Secondary | ICD-10-CM | POA: Diagnosis not present

## 2016-09-15 DIAGNOSIS — G8929 Other chronic pain: Secondary | ICD-10-CM | POA: Diagnosis present

## 2016-09-15 DIAGNOSIS — N141 Nephropathy induced by other drugs, medicaments and biological substances: Secondary | ICD-10-CM | POA: Diagnosis not present

## 2016-09-15 DIAGNOSIS — I7101 Dissection of thoracic aorta: Secondary | ICD-10-CM | POA: Diagnosis not present

## 2016-09-15 DIAGNOSIS — K219 Gastro-esophageal reflux disease without esophagitis: Secondary | ICD-10-CM | POA: Diagnosis present

## 2016-09-15 DIAGNOSIS — Z955 Presence of coronary angioplasty implant and graft: Secondary | ICD-10-CM

## 2016-09-15 DIAGNOSIS — Z22322 Carrier or suspected carrier of Methicillin resistant Staphylococcus aureus: Secondary | ICD-10-CM

## 2016-09-15 DIAGNOSIS — M545 Low back pain: Secondary | ICD-10-CM | POA: Diagnosis present

## 2016-09-15 DIAGNOSIS — Z79899 Other long term (current) drug therapy: Secondary | ICD-10-CM

## 2016-09-15 DIAGNOSIS — R079 Chest pain, unspecified: Secondary | ICD-10-CM | POA: Diagnosis not present

## 2016-09-15 DIAGNOSIS — E039 Hypothyroidism, unspecified: Secondary | ICD-10-CM | POA: Diagnosis not present

## 2016-09-15 DIAGNOSIS — I161 Hypertensive emergency: Secondary | ICD-10-CM | POA: Diagnosis present

## 2016-09-15 DIAGNOSIS — I2510 Atherosclerotic heart disease of native coronary artery without angina pectoris: Secondary | ICD-10-CM | POA: Diagnosis present

## 2016-09-15 DIAGNOSIS — R0789 Other chest pain: Secondary | ICD-10-CM | POA: Diagnosis not present

## 2016-09-15 DIAGNOSIS — Z823 Family history of stroke: Secondary | ICD-10-CM

## 2016-09-15 DIAGNOSIS — I10 Essential (primary) hypertension: Secondary | ICD-10-CM | POA: Diagnosis present

## 2016-09-15 DIAGNOSIS — Z9119 Patient's noncompliance with other medical treatment and regimen: Secondary | ICD-10-CM

## 2016-09-15 DIAGNOSIS — F1410 Cocaine abuse, uncomplicated: Secondary | ICD-10-CM | POA: Diagnosis not present

## 2016-09-15 DIAGNOSIS — L8990 Pressure ulcer of unspecified site, unspecified stage: Secondary | ICD-10-CM | POA: Insufficient documentation

## 2016-09-15 DIAGNOSIS — N189 Chronic kidney disease, unspecified: Secondary | ICD-10-CM | POA: Diagnosis not present

## 2016-09-15 DIAGNOSIS — Z8249 Family history of ischemic heart disease and other diseases of the circulatory system: Secondary | ICD-10-CM

## 2016-09-15 DIAGNOSIS — Z8673 Personal history of transient ischemic attack (TIA), and cerebral infarction without residual deficits: Secondary | ICD-10-CM | POA: Diagnosis not present

## 2016-09-15 DIAGNOSIS — D509 Iron deficiency anemia, unspecified: Secondary | ICD-10-CM | POA: Diagnosis present

## 2016-09-15 DIAGNOSIS — Z6833 Body mass index (BMI) 33.0-33.9, adult: Secondary | ICD-10-CM

## 2016-09-15 DIAGNOSIS — R4781 Slurred speech: Secondary | ICD-10-CM | POA: Diagnosis present

## 2016-09-15 DIAGNOSIS — Z91199 Patient's noncompliance with other medical treatment and regimen due to unspecified reason: Secondary | ICD-10-CM

## 2016-09-15 DIAGNOSIS — R748 Abnormal levels of other serum enzymes: Secondary | ICD-10-CM | POA: Diagnosis present

## 2016-09-15 DIAGNOSIS — K5900 Constipation, unspecified: Secondary | ICD-10-CM | POA: Diagnosis present

## 2016-09-15 DIAGNOSIS — R072 Precordial pain: Secondary | ICD-10-CM | POA: Diagnosis not present

## 2016-09-15 DIAGNOSIS — E669 Obesity, unspecified: Secondary | ICD-10-CM | POA: Diagnosis present

## 2016-09-15 HISTORY — DX: Patient's noncompliance with other medical treatment and regimen due to unspecified reason: Z91.199

## 2016-09-15 HISTORY — DX: Dissection of thoracic aorta: I71.01

## 2016-09-15 HISTORY — DX: Cardiomyopathy in diseases classified elsewhere: I43

## 2016-09-15 HISTORY — DX: Hypertensive heart disease without heart failure: I11.9

## 2016-09-15 HISTORY — DX: Patient's noncompliance with other medical treatment and regimen: Z91.19

## 2016-09-15 LAB — CBC
HEMATOCRIT: 31.6 % — AB (ref 39.0–52.0)
HEMOGLOBIN: 9.7 g/dL — AB (ref 13.0–17.0)
MCH: 27.1 pg (ref 26.0–34.0)
MCHC: 30.7 g/dL (ref 30.0–36.0)
MCV: 88.3 fL (ref 78.0–100.0)
Platelets: 298 10*3/uL (ref 150–400)
RBC: 3.58 MIL/uL — ABNORMAL LOW (ref 4.22–5.81)
RDW: 16.2 % — ABNORMAL HIGH (ref 11.5–15.5)
WBC: 12.2 10*3/uL — ABNORMAL HIGH (ref 4.0–10.5)

## 2016-09-15 LAB — BASIC METABOLIC PANEL
ANION GAP: 11 (ref 5–15)
BUN: 54 mg/dL — ABNORMAL HIGH (ref 6–20)
CO2: 25 mmol/L (ref 22–32)
Calcium: 8 mg/dL — ABNORMAL LOW (ref 8.9–10.3)
Chloride: 92 mmol/L — ABNORMAL LOW (ref 101–111)
Creatinine, Ser: 4.02 mg/dL — ABNORMAL HIGH (ref 0.61–1.24)
GFR calc Af Amer: 18 mL/min — ABNORMAL LOW (ref >60)
GFR, EST NON AFRICAN AMERICAN: 16 mL/min — AB (ref >60)
GLUCOSE: 125 mg/dL — AB (ref 65–99)
POTASSIUM: 2.9 mmol/L — AB (ref 3.5–5.1)
Sodium: 128 mmol/L — ABNORMAL LOW (ref 135–145)

## 2016-09-15 LAB — TROPONIN I: TROPONIN I: 0.27 ng/mL — AB (ref <0.03)

## 2016-09-15 NOTE — ED Triage Notes (Signed)
The pt is c/o heart pain he is not  Sure how long  He has  Dizziness nausea and sob hx mi

## 2016-09-15 NOTE — ED Notes (Signed)
RN Shanda Bumps informed of pt BP

## 2016-09-16 ENCOUNTER — Encounter (HOSPITAL_COMMUNITY): Payer: Self-pay | Admitting: Thoracic Surgery (Cardiothoracic Vascular Surgery)

## 2016-09-16 ENCOUNTER — Inpatient Hospital Stay (HOSPITAL_COMMUNITY): Payer: Medicare Other

## 2016-09-16 DIAGNOSIS — R918 Other nonspecific abnormal finding of lung field: Secondary | ICD-10-CM | POA: Diagnosis not present

## 2016-09-16 DIAGNOSIS — I119 Hypertensive heart disease without heart failure: Secondary | ICD-10-CM | POA: Diagnosis present

## 2016-09-16 DIAGNOSIS — I43 Cardiomyopathy in diseases classified elsewhere: Secondary | ICD-10-CM | POA: Diagnosis present

## 2016-09-16 DIAGNOSIS — I10 Essential (primary) hypertension: Secondary | ICD-10-CM | POA: Diagnosis not present

## 2016-09-16 DIAGNOSIS — D509 Iron deficiency anemia, unspecified: Secondary | ICD-10-CM | POA: Diagnosis present

## 2016-09-16 DIAGNOSIS — N183 Chronic kidney disease, stage 3 unspecified: Secondary | ICD-10-CM | POA: Diagnosis present

## 2016-09-16 DIAGNOSIS — J90 Pleural effusion, not elsewhere classified: Secondary | ICD-10-CM | POA: Diagnosis present

## 2016-09-16 DIAGNOSIS — I161 Hypertensive emergency: Principal | ICD-10-CM

## 2016-09-16 DIAGNOSIS — Z23 Encounter for immunization: Secondary | ICD-10-CM | POA: Diagnosis not present

## 2016-09-16 DIAGNOSIS — D631 Anemia in chronic kidney disease: Secondary | ICD-10-CM | POA: Diagnosis not present

## 2016-09-16 DIAGNOSIS — I71012 Dissection of descending thoracic aorta: Secondary | ICD-10-CM

## 2016-09-16 DIAGNOSIS — R0902 Hypoxemia: Secondary | ICD-10-CM | POA: Diagnosis not present

## 2016-09-16 DIAGNOSIS — X58XXXA Exposure to other specified factors, initial encounter: Secondary | ICD-10-CM | POA: Diagnosis not present

## 2016-09-16 DIAGNOSIS — N179 Acute kidney failure, unspecified: Secondary | ICD-10-CM | POA: Diagnosis not present

## 2016-09-16 DIAGNOSIS — I1310 Hypertensive heart and chronic kidney disease without heart failure, with stage 1 through stage 4 chronic kidney disease, or unspecified chronic kidney disease: Secondary | ICD-10-CM | POA: Diagnosis present

## 2016-09-16 DIAGNOSIS — E871 Hypo-osmolality and hyponatremia: Secondary | ICD-10-CM | POA: Diagnosis present

## 2016-09-16 DIAGNOSIS — J9811 Atelectasis: Secondary | ICD-10-CM | POA: Diagnosis present

## 2016-09-16 DIAGNOSIS — K219 Gastro-esophageal reflux disease without esophagitis: Secondary | ICD-10-CM | POA: Diagnosis not present

## 2016-09-16 DIAGNOSIS — G8929 Other chronic pain: Secondary | ICD-10-CM | POA: Diagnosis present

## 2016-09-16 DIAGNOSIS — E785 Hyperlipidemia, unspecified: Secondary | ICD-10-CM | POA: Diagnosis present

## 2016-09-16 DIAGNOSIS — Y92239 Unspecified place in hospital as the place of occurrence of the external cause: Secondary | ICD-10-CM | POA: Diagnosis not present

## 2016-09-16 DIAGNOSIS — I509 Heart failure, unspecified: Secondary | ICD-10-CM | POA: Diagnosis not present

## 2016-09-16 DIAGNOSIS — Z91199 Patient's noncompliance with other medical treatment and regimen due to unspecified reason: Secondary | ICD-10-CM

## 2016-09-16 DIAGNOSIS — N189 Chronic kidney disease, unspecified: Secondary | ICD-10-CM | POA: Diagnosis not present

## 2016-09-16 DIAGNOSIS — G4700 Insomnia, unspecified: Secondary | ICD-10-CM | POA: Diagnosis present

## 2016-09-16 DIAGNOSIS — Z9119 Patient's noncompliance with other medical treatment and regimen: Secondary | ICD-10-CM

## 2016-09-16 DIAGNOSIS — L8990 Pressure ulcer of unspecified site, unspecified stage: Secondary | ICD-10-CM | POA: Insufficient documentation

## 2016-09-16 DIAGNOSIS — F410 Panic disorder [episodic paroxysmal anxiety] without agoraphobia: Secondary | ICD-10-CM | POA: Diagnosis present

## 2016-09-16 DIAGNOSIS — I7101 Dissection of thoracic aorta: Secondary | ICD-10-CM | POA: Diagnosis not present

## 2016-09-16 DIAGNOSIS — E8770 Fluid overload, unspecified: Secondary | ICD-10-CM | POA: Diagnosis present

## 2016-09-16 DIAGNOSIS — I2510 Atherosclerotic heart disease of native coronary artery without angina pectoris: Secondary | ICD-10-CM | POA: Diagnosis not present

## 2016-09-16 DIAGNOSIS — I7102 Dissection of abdominal aorta: Secondary | ICD-10-CM | POA: Diagnosis not present

## 2016-09-16 DIAGNOSIS — R109 Unspecified abdominal pain: Secondary | ICD-10-CM | POA: Diagnosis not present

## 2016-09-16 DIAGNOSIS — I255 Ischemic cardiomyopathy: Secondary | ICD-10-CM | POA: Diagnosis present

## 2016-09-16 DIAGNOSIS — R079 Chest pain, unspecified: Secondary | ICD-10-CM | POA: Diagnosis not present

## 2016-09-16 DIAGNOSIS — I129 Hypertensive chronic kidney disease with stage 1 through stage 4 chronic kidney disease, or unspecified chronic kidney disease: Secondary | ICD-10-CM | POA: Diagnosis not present

## 2016-09-16 DIAGNOSIS — E039 Hypothyroidism, unspecified: Secondary | ICD-10-CM | POA: Diagnosis present

## 2016-09-16 DIAGNOSIS — Z8673 Personal history of transient ischemic attack (TIA), and cerebral infarction without residual deficits: Secondary | ICD-10-CM | POA: Diagnosis not present

## 2016-09-16 DIAGNOSIS — I252 Old myocardial infarction: Secondary | ICD-10-CM | POA: Diagnosis not present

## 2016-09-16 DIAGNOSIS — R0789 Other chest pain: Secondary | ICD-10-CM | POA: Diagnosis present

## 2016-09-16 DIAGNOSIS — K567 Ileus, unspecified: Secondary | ICD-10-CM | POA: Diagnosis not present

## 2016-09-16 DIAGNOSIS — M545 Low back pain: Secondary | ICD-10-CM | POA: Diagnosis present

## 2016-09-16 DIAGNOSIS — F1410 Cocaine abuse, uncomplicated: Secondary | ICD-10-CM | POA: Diagnosis not present

## 2016-09-16 DIAGNOSIS — E876 Hypokalemia: Secondary | ICD-10-CM | POA: Diagnosis present

## 2016-09-16 DIAGNOSIS — K5900 Constipation, unspecified: Secondary | ICD-10-CM | POA: Diagnosis not present

## 2016-09-16 DIAGNOSIS — R072 Precordial pain: Secondary | ICD-10-CM | POA: Diagnosis not present

## 2016-09-16 DIAGNOSIS — N184 Chronic kidney disease, stage 4 (severe): Secondary | ICD-10-CM | POA: Diagnosis not present

## 2016-09-16 HISTORY — DX: Dissection of descending thoracic aorta: I71.012

## 2016-09-16 HISTORY — DX: Dissection of thoracic aorta: I71.01

## 2016-09-16 LAB — HIV ANTIBODY (ROUTINE TESTING W REFLEX): HIV Screen 4th Generation wRfx: NONREACTIVE

## 2016-09-16 LAB — URINALYSIS, ROUTINE W REFLEX MICROSCOPIC
Bilirubin Urine: NEGATIVE
GLUCOSE, UA: NEGATIVE mg/dL
Ketones, ur: NEGATIVE mg/dL
LEUKOCYTES UA: NEGATIVE
Nitrite: NEGATIVE
PH: 6 (ref 5.0–8.0)
Protein, ur: 100 mg/dL — AB
Specific Gravity, Urine: 1.011 (ref 1.005–1.030)
Squamous Epithelial / LPF: NONE SEEN

## 2016-09-16 LAB — RAPID URINE DRUG SCREEN, HOSP PERFORMED
AMPHETAMINES: NOT DETECTED
BARBITURATES: NOT DETECTED
BENZODIAZEPINES: NOT DETECTED
Cocaine: POSITIVE — AB
Opiates: POSITIVE — AB
TETRAHYDROCANNABINOL: NOT DETECTED

## 2016-09-16 LAB — BASIC METABOLIC PANEL
ANION GAP: 12 (ref 5–15)
BUN: 48 mg/dL — ABNORMAL HIGH (ref 6–20)
CALCIUM: 7.7 mg/dL — AB (ref 8.9–10.3)
CO2: 24 mmol/L (ref 22–32)
Chloride: 99 mmol/L — ABNORMAL LOW (ref 101–111)
Creatinine, Ser: 3.57 mg/dL — ABNORMAL HIGH (ref 0.61–1.24)
GFR, EST AFRICAN AMERICAN: 21 mL/min — AB (ref >60)
GFR, EST NON AFRICAN AMERICAN: 18 mL/min — AB (ref >60)
GLUCOSE: 119 mg/dL — AB (ref 65–99)
POTASSIUM: 3 mmol/L — AB (ref 3.5–5.1)
Sodium: 135 mmol/L (ref 135–145)

## 2016-09-16 LAB — CBC
HCT: 29 % — ABNORMAL LOW (ref 39.0–52.0)
HEMOGLOBIN: 9.2 g/dL — AB (ref 13.0–17.0)
MCH: 27.8 pg (ref 26.0–34.0)
MCHC: 31.7 g/dL (ref 30.0–36.0)
MCV: 87.6 fL (ref 78.0–100.0)
Platelets: 256 10*3/uL (ref 150–400)
RBC: 3.31 MIL/uL — ABNORMAL LOW (ref 4.22–5.81)
RDW: 16.6 % — ABNORMAL HIGH (ref 11.5–15.5)
WBC: 10.5 10*3/uL (ref 4.0–10.5)

## 2016-09-16 LAB — PHOSPHORUS: PHOSPHORUS: 3.2 mg/dL (ref 2.5–4.6)

## 2016-09-16 LAB — TROPONIN I
TROPONIN I: 0.24 ng/mL — AB (ref <0.03)
Troponin I: 0.51 ng/mL (ref <0.03)
Troponin I: 0.96 ng/mL (ref <0.03)

## 2016-09-16 LAB — MAGNESIUM: MAGNESIUM: 1.7 mg/dL (ref 1.7–2.4)

## 2016-09-16 LAB — MRSA PCR SCREENING: MRSA BY PCR: POSITIVE — AB

## 2016-09-16 MED ORDER — IOPAMIDOL (ISOVUE-370) INJECTION 76%
100.0000 mL | Freq: Once | INTRAVENOUS | Status: AC | PRN
  Administered 2016-09-16: 100 mL via INTRAVENOUS

## 2016-09-16 MED ORDER — LORAZEPAM 2 MG/ML IJ SOLN
1.0000 mg | INTRAMUSCULAR | Status: DC | PRN
  Administered 2016-09-17 – 2016-09-24 (×17): 1 mg via INTRAVENOUS
  Filled 2016-09-16 (×17): qty 1

## 2016-09-16 MED ORDER — CHLORHEXIDINE GLUCONATE CLOTH 2 % EX PADS
6.0000 | MEDICATED_PAD | Freq: Every day | CUTANEOUS | Status: AC
  Administered 2016-09-17 – 2016-09-20 (×4): 6 via TOPICAL

## 2016-09-16 MED ORDER — LABETALOL HCL 5 MG/ML IV SOLN
10.0000 mg | INTRAVENOUS | Status: DC | PRN
  Administered 2016-09-16 – 2016-09-17 (×3): 20 mg via INTRAVENOUS
  Administered 2016-09-17: 10 mg via INTRAVENOUS
  Administered 2016-09-18 – 2016-09-21 (×5): 20 mg via INTRAVENOUS
  Filled 2016-09-16 (×10): qty 4

## 2016-09-16 MED ORDER — SODIUM CHLORIDE 0.9 % IV SOLN
INTRAVENOUS | Status: AC
  Administered 2016-09-16: 08:00:00 via INTRAVENOUS

## 2016-09-16 MED ORDER — NICARDIPINE HCL IN NACL 20-0.86 MG/200ML-% IV SOLN
3.0000 mg/h | INTRAVENOUS | Status: DC
  Administered 2016-09-16: 4 mg/h via INTRAVENOUS
  Administered 2016-09-16: 10 mg/h via INTRAVENOUS
  Administered 2016-09-16: 15 mg/h via INTRAVENOUS
  Filled 2016-09-16: qty 200

## 2016-09-16 MED ORDER — PANTOPRAZOLE SODIUM 40 MG PO TBEC
40.0000 mg | DELAYED_RELEASE_TABLET | Freq: Every day | ORAL | Status: DC
  Administered 2016-09-16 – 2016-09-25 (×10): 40 mg via ORAL
  Filled 2016-09-16 (×9): qty 1

## 2016-09-16 MED ORDER — NICARDIPINE HCL IN NACL 20-0.86 MG/200ML-% IV SOLN
INTRAVENOUS | Status: AC
Start: 2016-09-16 — End: 2016-09-16
  Filled 2016-09-16: qty 200

## 2016-09-16 MED ORDER — NICARDIPINE HCL IN NACL 20-0.86 MG/200ML-% IV SOLN
3.0000 mg/h | Freq: Once | INTRAVENOUS | Status: AC
  Administered 2016-09-16: 15 mg/h via INTRAVENOUS
  Filled 2016-09-16 (×4): qty 200

## 2016-09-16 MED ORDER — ONDANSETRON HCL 4 MG/2ML IJ SOLN
4.0000 mg | Freq: Once | INTRAMUSCULAR | Status: AC
  Administered 2016-09-16: 4 mg via INTRAVENOUS
  Filled 2016-09-16: qty 2

## 2016-09-16 MED ORDER — LEVOTHYROXINE SODIUM 50 MCG PO TABS
50.0000 ug | ORAL_TABLET | Freq: Every day | ORAL | Status: DC
  Administered 2016-09-16 – 2016-09-25 (×10): 50 ug via ORAL
  Filled 2016-09-16 (×10): qty 1

## 2016-09-16 MED ORDER — SODIUM CHLORIDE 0.9 % IV BOLUS (SEPSIS)
1000.0000 mL | Freq: Once | INTRAVENOUS | Status: AC
  Administered 2016-09-16: 1000 mL via INTRAVENOUS

## 2016-09-16 MED ORDER — NICARDIPINE HCL IN NACL 20-0.86 MG/200ML-% IV SOLN
3.0000 mg/h | Freq: Once | INTRAVENOUS | Status: AC
  Administered 2016-09-16: 5 mg/h via INTRAVENOUS
  Filled 2016-09-16 (×2): qty 200

## 2016-09-16 MED ORDER — ESMOLOL HCL-SODIUM CHLORIDE 2000 MG/100ML IV SOLN
25.0000 ug/kg/min | INTRAVENOUS | Status: DC
  Filled 2016-09-16: qty 100

## 2016-09-16 MED ORDER — POTASSIUM CHLORIDE 10 MEQ/100ML IV SOLN
INTRAVENOUS | Status: AC
  Administered 2016-09-16: 10 meq
  Filled 2016-09-16: qty 100

## 2016-09-16 MED ORDER — MUPIROCIN 2 % EX OINT
1.0000 "application " | TOPICAL_OINTMENT | Freq: Two times a day (BID) | CUTANEOUS | Status: AC
  Administered 2016-09-16 – 2016-09-20 (×9): 1 via NASAL
  Filled 2016-09-16 (×2): qty 22

## 2016-09-16 MED ORDER — CLONIDINE HCL 0.1 MG PO TABS
0.1000 mg | ORAL_TABLET | Freq: Two times a day (BID) | ORAL | Status: DC
  Administered 2016-09-16 (×2): 0.1 mg via ORAL
  Filled 2016-09-16 (×2): qty 1

## 2016-09-16 MED ORDER — CLONIDINE HCL 0.1 MG PO TABS: 0.1000 mg | ORAL_TABLET | Freq: Two times a day (BID) | ORAL | Status: DC

## 2016-09-16 MED ORDER — ASPIRIN EC 81 MG PO TBEC
81.0000 mg | DELAYED_RELEASE_TABLET | Freq: Every day | ORAL | Status: DC
  Filled 2016-09-16: qty 1

## 2016-09-16 MED ORDER — ASPIRIN 81 MG PO CHEW
324.0000 mg | CHEWABLE_TABLET | Freq: Once | ORAL | Status: AC
  Administered 2016-09-16: 324 mg via ORAL
  Filled 2016-09-16: qty 4

## 2016-09-16 MED ORDER — HYDRALAZINE HCL 20 MG/ML IJ SOLN
10.0000 mg | INTRAMUSCULAR | Status: DC | PRN
  Administered 2016-09-16: 20 mg via INTRAVENOUS
  Administered 2016-09-16: 10 mg via INTRAVENOUS
  Administered 2016-09-17 (×2): 20 mg via INTRAVENOUS
  Administered 2016-09-17 – 2016-09-19 (×4): 40 mg via INTRAVENOUS
  Administered 2016-09-20: 20 mg via INTRAVENOUS
  Administered 2016-09-21: 40 mg via INTRAVENOUS
  Filled 2016-09-16 (×2): qty 2
  Filled 2016-09-16 (×2): qty 1
  Filled 2016-09-16: qty 2
  Filled 2016-09-16 (×2): qty 1
  Filled 2016-09-16: qty 2
  Filled 2016-09-16: qty 1
  Filled 2016-09-16 (×2): qty 2

## 2016-09-16 MED ORDER — POTASSIUM CHLORIDE CRYS ER 20 MEQ PO TBCR
40.0000 meq | EXTENDED_RELEASE_TABLET | Freq: Once | ORAL | Status: AC
  Administered 2016-09-16: 40 meq via ORAL
  Filled 2016-09-16: qty 2

## 2016-09-16 MED ORDER — SODIUM CHLORIDE 0.9 % IV SOLN
250.0000 mL | INTRAVENOUS | Status: DC | PRN
Start: 2016-09-16 — End: 2016-09-25

## 2016-09-16 MED ORDER — BISACODYL 10 MG RE SUPP
10.0000 mg | Freq: Every day | RECTAL | Status: DC | PRN
  Administered 2016-09-16 – 2016-09-18 (×2): 10 mg via RECTAL
  Filled 2016-09-16 (×2): qty 1

## 2016-09-16 MED ORDER — PNEUMOCOCCAL VAC POLYVALENT 25 MCG/0.5ML IJ INJ
0.5000 mL | INJECTION | INTRAMUSCULAR | Status: AC
  Administered 2016-09-17: 0.5 mL via INTRAMUSCULAR
  Filled 2016-09-16: qty 0.5

## 2016-09-16 MED ORDER — MORPHINE SULFATE (PF) 4 MG/ML IV SOLN
4.0000 mg | Freq: Once | INTRAVENOUS | Status: AC
  Administered 2016-09-16: 4 mg via INTRAVENOUS
  Filled 2016-09-16: qty 1

## 2016-09-16 MED ORDER — NITROGLYCERIN 0.4 MG SL SUBL
0.4000 mg | SUBLINGUAL_TABLET | SUBLINGUAL | Status: AC | PRN
  Administered 2016-09-16 (×3): 0.4 mg via SUBLINGUAL
  Filled 2016-09-16: qty 1

## 2016-09-16 MED ORDER — HEPARIN SODIUM (PORCINE) 5000 UNIT/ML IJ SOLN
5000.0000 [IU] | Freq: Three times a day (TID) | INTRAMUSCULAR | Status: DC
  Filled 2016-09-16: qty 1

## 2016-09-16 MED ORDER — LABETALOL HCL 200 MG PO TABS
200.0000 mg | ORAL_TABLET | Freq: Two times a day (BID) | ORAL | Status: DC
  Administered 2016-09-16 (×2): 200 mg via ORAL
  Filled 2016-09-16 (×2): qty 1

## 2016-09-16 MED ORDER — MORPHINE SULFATE (PF) 4 MG/ML IV SOLN
2.0000 mg | INTRAVENOUS | Status: DC | PRN
  Administered 2016-09-16 (×2): 4 mg via INTRAVENOUS
  Filled 2016-09-16 (×2): qty 1

## 2016-09-16 MED ORDER — POTASSIUM CHLORIDE 10 MEQ/100ML IV SOLN
10.0000 meq | INTRAVENOUS | Status: AC
  Administered 2016-09-16 (×3): 10 meq via INTRAVENOUS
  Filled 2016-09-16 (×3): qty 100

## 2016-09-16 MED ORDER — HYDRALAZINE HCL 25 MG PO TABS
25.0000 mg | ORAL_TABLET | Freq: Three times a day (TID) | ORAL | Status: DC
  Administered 2016-09-16 (×3): 25 mg via ORAL
  Filled 2016-09-16 (×3): qty 1

## 2016-09-16 MED ORDER — FENTANYL CITRATE (PF) 100 MCG/2ML IJ SOLN
25.0000 ug | INTRAMUSCULAR | Status: DC | PRN
  Administered 2016-09-16 (×2): 50 ug via INTRAVENOUS
  Administered 2016-09-17 (×3): 100 ug via INTRAVENOUS
  Administered 2016-09-17: 75 ug via INTRAVENOUS
  Administered 2016-09-17: 50 ug via INTRAVENOUS
  Administered 2016-09-18 (×3): 100 ug via INTRAVENOUS
  Filled 2016-09-16 (×10): qty 2

## 2016-09-16 MED ORDER — LABETALOL HCL 5 MG/ML IV SOLN
0.5000 mg/min | INTRAVENOUS | Status: DC
  Administered 2016-09-16: 0.5 mg/min via INTRAVENOUS
  Filled 2016-09-16: qty 100

## 2016-09-16 NOTE — ED Provider Notes (Addendum)
TIME SEEN: 12:06 AM  CHIEF COMPLAINT: chest pain  HPI: Patient is a 56 year old male with known history of coronary artery disease status post 5 stents, ischemic cardiomyopathy, hypertension, previous CVA, history of cocaine use who presents to  the emergency department with complaints of chest pain. Patient is a very poor historian. He reports he has had diffuse chest tightness and sharp left lower chest pain that has been constant for 3 days. Has had associated shortness of breath, diaphoresis, dizziness. Reports also having nausea and vomiting and some intermittent lower abdominal pain. No abdominal pain or back pain currently. He is extremely hypertensive in the emergency department and states he has been off of his medication for the past 2-3 years. He does not have a primary care provider.   Patient reports that he is a full code.  ROS: See HPI Constitutional: no fever  Eyes: no drainage  ENT: no runny nose   Cardiovascular:   chest pain  Resp:  SOB  GI:  vomiting GU: no dysuria Integumentary: no rash  Allergy: no hives  Musculoskeletal: no leg swelling  Neurological: no slurred speech ROS otherwise negative  PAST MEDICAL HISTORY/PAST SURGICAL HISTORY:  Past Medical History:  Diagnosis Date  . Anxiety   . Arthritis    "all over" (08/11/2012)  . Carotid stenosis    Carotid US (5/15): Bilateral ICA 1-39%  . Chronic lower back pain   . Coronary artery disease    a. LHC (08/11/12): Mid LAD 90, distal RCA 60-70, EF 30%. PCI: Veriflex (4 x 12 mm) BMS to the LAD.  RCA tx medically. b. cath 04/07/2014 DES to distal RCA, patient left AMA on the same night  . GERD (gastroesophageal reflux disease)   . Hx of echocardiogram    Echo (5/15): EF 40-45%, normal wall motion, mild LAE  . Hypertension   . Ischemic cardiomyopathy    a.  Myoview (07/2012): Inferior lateral infarct with minimal amount of peri-infarct ischemia, EF 25%.>>> s/p PCI to LAD 7/14 >>> b. Echo (5/15): EF 40-45%  . MI  (myocardial infarction) (HCC) 12/2010   cocaine induced/notes 01/18/2011   . Migraines    "I get them q few days" (08/11/2012)  . Polysubstance abuse    cocaine and marijuana  . Skull fracture (HCC) 1974   "left me w/this stutter & migraine headaches; had to learn to walk, talk, everything again" (08/11/2012)  . Stroke [facility]) [phone]   "memory problems since" (08/11/2012)  . Thyroid disease     MEDICATIONS:  Prior to Admission medications   Medication Sig Start Date End Date Taking? Authorizing Provider  acetaminophen-codeine (TYLENOL #2) 300-15 MG per tablet Take 1 tablet by mouth every 8 (eight) hours as needed for moderate pain. 11/15/13   Doris Cheadle, MD  Amantadine HCl 100 MG tablet Take 100 mg by mouth 2 (two) times daily.  06/16/13   [provider]  aspirin 81 MG tablet Take 81 mg by mouth daily.    [provider]  atorvastatin (LIPITOR) 80 MG tablet TAKE 1 TABLET BY MOUTH DAILY AT 6 PM 11/22/13   Jake Bathe, MD  baclofen (LIORESAL) 10 MG tablet Take 1 tablet (10 mg total) by mouth at bedtime. 10/25/13   Doris Cheadle, MD  buPROPion (WELLBUTRIN) 100 MG tablet Take 1 tablet (100 mg total) by mouth 2 (two) times daily. 09/08/13   Doris Cheadle, MD  cloNIDine (CATAPRES) 0.1 MG tablet TAKE 1 TABLET BY MOUTH TWICE DAILY 02/22/14   [name]  C, MD  clopidogrel (PLAVIX) 75 MG tablet Take 1 tablet (75 mg total) by mouth daily with breakfast. 04/07/14   Azalee Course, PA  hydrALAZINE (APRESOLINE) 25 MG tablet Take 1 tablet (25 mg total) by mouth 3 (three) times daily. 08/26/14   Jake Bathe, MD  levothyroxine (SYNTHROID, LEVOTHROID) 50 MCG tablet Take 1 tablet (50 mcg total) by mouth daily. 09/08/13   Doris Cheadle, MD  losartan (COZAAR) 100 MG tablet Take 1 tablet (100 mg total) by mouth daily. 02/25/14   Tereso Newcomer T, PA-C  metoprolol (LOPRESSOR) 100 MG tablet TAKE 1 TABLET BY MOUTH TWICE DAILY 03/21/14   Jake Bathe, MD  nitroGLYCERIN (NITROSTAT) 0.4 MG SL tablet  Place 1 tablet (0.4 mg total) under the tongue every 5 (five) minutes as needed. Chest pain 01/03/14   Jake Bathe, MD  pantoprazole (PROTONIX) 40 MG tablet Take 1 tablet (40 mg total) by mouth daily. 04/19/13   Richarda Overlie, MD  spironolactone (ALDACTONE) 25 MG tablet TAKE 1 TABLET BY MOUTH EVERY DAY 03/21/14   Jake Bathe, MD  traMADol (ULTRAM) 50 MG tablet Take 1 tablet (50 mg total) by mouth every 8 (eight) hours as needed for moderate pain. 10/25/13   Doris Cheadle, MD  zolpidem (AMBIEN) 10 MG tablet  10/04/13   [provider]    ALLERGIES:  Allergies  Allergen Reactions  . Penicillins Rash         SOCIAL HISTORY:  Social History  Substance Use Topics  . Smoking status: Former Smoker    Packs/day: 1.00    Years: 30.00    Types: Cigarettes    Start date: 12/30/2013  . Smokeless tobacco: Never Used  . Alcohol use No    FAMILY HISTORY: Family History  Problem Relation Age of Onset  . Breast cancer Mother   . Ulcers Father        GI bleed  . Heart attack Father   . Hypertension Father   . Stroke Father     EXAM: BP (!) 243/147 (BP Location: Right Arm)   Pulse (!) 102   Temp 97.7 F (36.5 C) (Oral)   Resp 20   Ht 5\' 5"  (1.651 m)   Wt 79.4 kg (175 lb)   SpO2 95%   BMI 29.12 kg/m  CONSTITUTIONAL: Alert and oriented 3. Patient is a very poor historian. He appears uncomfortable and is very hypertensive. Chronically ill-appearing. HEAD: Normocephalic EYES: Conjunctivae clear, pupils appear equal, EOMI ENT: normal nose; moist mucous membranes NECK: Supple, no meningismus, no nuchal rigidity, no LAD  CARD: RRR; S1 and S2 appreciated; no murmurs, no clicks, no rubs, no gallops RESP: Normal chest excursion without splinting or tachypnea; breath sounds clear and equal bilaterally; no wheezes, no rhonchi, no rales, no hypoxia or respiratory distress, speaking full sentences ABD/GI: Normal bowel sounds; non-distended; soft, non-tender, no rebound, no  guarding, no peritoneal signs, no hepatosplenomegaly BACK:  The back appears normal and is non-tender to palpation, there is no CVA tenderness EXT: Normal ROM in all joints; non-tender to palpation; no edema; normal capillary refill; no cyanosis, no calf tenderness or swelling, all extremities are warm and well-perfused, 2+ radial and DP pulses bilaterally, no sign of compartment syndrome    SKIN: Normal color for age and race; warm; no rash NEURO: Moves all extremities equally, sensation to light touch intact diffusely, cranial nerves II through XII intact, slightly slurred speech which he reports is his baseline PSYCH: The patient's mood and manner  are appropriate. Grooming and personal hygiene are appropriate.  MEDICAL DECISION MAKING: Patient here with hypertensive emergency. Labs in triage show elevated troponin likely from demand ischemia, acute on chronic renal failure. EKG does not show any significant changes compared to previous. We'll give aspirin, nitroglycerin. He is at risk for dissection but his chest x-ray shows no widened mediastinum and shows stable cardiomegaly. No sign of volume overload at this time but he does have a history of ischemic cardiomyopathy. At this time I do not feel he needs a CT of his chest, abdomen and pelvis but will monitor him closely. He would be at risk for worsening renal failure if he received IV contrast for a CT scan. At this time I feel these risk outweigh benefits but if he were to develop worsening symptoms, abdominal or back pain, we would have a discussion with him on proceeding with imaging in the risk of dialysis in the future. We'll start Cardene drip. I feel patient will need admission. Will obtain urinalysis and also a urine drug screen given his history of previous cocaine use.  ED PROGRESS: 12:50 AM  Patient's blood pressure improving somewhat with nitroglycerin. His pain came from a 10/10 to a 7/10. We'll give IV morphine.  Cardene drip has been  started.   12:58 AM  D/w Dr. Marchelle Gearing with critical care. They will see patient in the emergency department for admission.   I reviewed all nursing notes, vitals, pertinent previous records, EKGs, lab and urine results, imaging (as available).     EKG Interpretation  Date/Time:  Sunday September 15 2016 22:28:52 EDT Ventricular Rate:  105 PR Interval:  158 QRS Duration: 106 QT Interval:  346 QTC Calculation: 457 R Axis:   16 Text Interpretation:  Sinus tachycardia Biatrial enlargement Left ventricular hypertrophy with repolarization abnormality Cannot rule out Septal infarct , age undetermined Abnormal ECG no significant change since May 2018 Confirmed by Pricilla Loveless [phone]) on 09/15/2016 10:33:58 PM        CRITICAL CARE Performed by: Raelyn Number   Total critical care time: 55 minutes  Critical care time was exclusive of separately billable procedures and treating other patients.  Critical care was necessary to treat or prevent imminent or life-threatening deterioration.  Critical care was time spent personally by me on the following activities: development of treatment plan with patient and/or surrogate as well as nursing, discussions with consultants, evaluation of patient's response to treatment, examination of patient, obtaining history from patient or surrogate, ordering and performing treatments and interventions, ordering and review of laboratory studies, ordering and review of radiographic studies, pulse oximetry and re-evaluation of patient's condition.     Solenne Manwarren, Layla Maw, DO 09/16/16 0200    Zakiyyah Savannah, Layla Maw, DO 09/16/16 0212    3:40 AM  Pt's blood pressure is improving on Cardene. He is now complaining of more abdominal pain. Patient seen by Dr. Jillene Bucks with critical care. Appreciate his help.  Given the complaints of abdominal pain and chest pain in the setting of hypertensive emergency both CCM and I am concerned about a possible dissection. Patient  has been counseled about the risk and benefits of CT imaging with IV contrast. He agrees to imaging as he understands that dissection, AAA, PE can be life-threatening illnesses. He is also aware that he may need dialysis after CT imaging and that this may be temporary or could be permanent. We will hydrate patient paced on critical care's recommendations.   4:35 AM  Pt had an  emergent CT scan to rule out dissection. It does appear he has acute aortic dissection from the left distal subclavian to right above the SMA. Discussed with Deatra Robinson with radiology. Dr. Jillene Bucks with critical care also aware of these results. Patient is currently in the ICU and on a Cardene drip. He is not anticoagulated. Dr. Lyman Bishop with ICU will discuss with cardiothoracic surgery.      Millianna Szymborski, Layla Maw, DO 09/16/16 [phone]

## 2016-09-16 NOTE — Consult Note (Signed)
Patient name: Austin Simmons MRN: [id number] DOB: 1960-09-17 Sex: male  REASON FOR CONSULT: aortic dissection, consult is from Dr. Cornelius Moras  HPI:   Austin Simmons is a 56 y.o. male, who presented with severe chest pain, abdominal pain and back pain early this am. He was hypertensive at 243/147. CTA was not initially performed due to elevated serum creatinine of 4.0. Critical care was consulted for admission and ultimately a CTA was ordered revealing type acute type B aortic dissection. Cardiothoracic surgery was consulted.  The patient currently only reports back pain. Denies any pain or numbness in his extremities.  The patient has a past medical history of poorly controlled hypertension, CAD with acute MI in 2014, has DES to distal RCA in 2016, CKD with creatinine of 2.5 in May 2018, history of CVA and history of cocaine abuse. He is a former smoker.  Past Medical History:  Diagnosis Date  . Anxiety   . Arthritis    "all over" (08/11/2012)  . Carotid stenosis    Carotid US (5/15): Bilateral ICA 1-39%  . Chronic lower back pain   . Coronary artery disease    a. LHC (08/11/12): Mid LAD 90, distal RCA 60-70, EF 30%. PCI: Veriflex (4 x 12 mm) BMS to the LAD.  RCA tx medically. b. cath 04/07/2014 DES to distal RCA, patient left AMA on the same night  . 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 GERD (gastroesophageal reflux disease)   . H/O noncompliance with medical treatment, presenting hazards to health   . Hx of echocardiogram    Echo (5/15): EF 40-45%, normal wall motion, mild LAE  . Hypertension   . Hypertensive cardiomyopathy (HCC)   . Ischemic cardiomyopathy    a.  Myoview (07/2012): Inferior lateral infarct with minimal amount of peri-infarct ischemia, EF 25%.>>> s/p PCI to LAD 7/14 >>> b. Echo (5/15): EF 40-45%  . MI (myocardial infarction) (HCC) 12/2010   cocaine induced/notes 01/18/2011   . Migraines    "I get them q few days" (08/11/2012)  . Polysubstance  abuse    cocaine and marijuana  . Skull fracture (HCC) 1974   "left me w/this stutter & migraine headaches; had to learn to walk, talk, everything again" (08/11/2012)  . Stroke [facility]) [phone]   "memory problems since" (08/11/2012)  . Thyroid disease     Family History  Problem Relation Age of Onset  . Breast cancer Mother   . Ulcers Father        GI bleed  . Heart attack Father   . Hypertension Father   . Stroke Father      SOCIAL HISTORY:   Social History   Social History  . Marital status: Married    Spouse name: N/A  . Number of children: 2  . Years of education: 42 th   Occupational History  . Disability    Social History Main Topics  . Smoking status: Former Smoker    Packs/day: 1.00    Years: 30.00    Types: Cigarettes    Start date: 12/30/2013  . Smokeless tobacco: Never Used  . Alcohol use No  . Drug use: Yes    Types: Marijuana, Cocaine     Comment: 08/11/2012 "smoke marijuana twice/month"  . Sexual activity: No   Other Topics Concern  . Not on file   Social History Narrative   ** Merged History Encounter **        Allergies  Allergen Reactions  .  Penicillins Rash         MEDICATIONS:    Current Facility-Administered Medications  Medication Dose Route Frequency Provider Last Rate Last Dose  . 0.9 %  sodium chloride infusion  250 mL Intravenous PRN Caro Laroche, MD      . 0.9 %  sodium chloride infusion   Intravenous Continuous Caro Laroche, MD      . fentaNYL (SUBLIMAZE) injection 25-100 mcg  25-100 mcg Intravenous Q1H PRN Caro Laroche, MD      . labetalol (NORMODYNE,TRANDATE) 500 mg in dextrose 5 % 125 mL (4 mg/mL) infusion  0.5-3 mg/min Intravenous Titrated Caro Laroche, MD 15 mL/hr at 09/16/16 1610 1 mg/min at 09/16/16 0608  . LORazepam (ATIVAN) injection 1 mg  1 mg Intravenous Q4H PRN Caro Laroche, MD      . nicardipine (CARDENE) 20mg  in 0.86% saline IV infusion (0.1 mg/ml)  3-15 mg/hr Intravenous  Continuous Caro Laroche, MD      . potassium chloride 10 mEq in 100 mL IVPB  10 mEq Intravenous Q1 Hr x 4 Caro Laroche, MD        REVIEW OF SYSTEMS:    REVIEW OF SYSTEMS (negative unless checked):   Cardiac:  [x]  Chest pain or chest pressure? []  Shortness of breath upon activity? []  Shortness of breath when lying flat? []  Irregular heart rhythm?  Vascular:  []  Pain in calf, thigh, or hip brought on by walking? []  Pain in feet at night that wakes you up from your sleep? []  Blood clot in your veins? []  Leg swelling?  Pulmonary:  []  Oxygen at home? []  Productive cough? []  Wheezing?  Neurologic:  []  Sudden weakness in arms or legs? []  Sudden numbness in arms or legs? []  Sudden onset of difficult speaking or slurred speech? []  Temporary loss of vision in one eye? []  Problems with dizziness?  Gastrointestinal:  []  Blood in stool? []  Vomited blood?  Genitourinary:  []  Burning when urinating? []  Blood in urine?  Psychiatric:  []  Major depression  Hematologic:  []  Bleeding problems? []  Problems with blood clotting?  Dermatologic:  []  Rashes or ulcers?  Constitutional:  []  Fever or chills?  Ear/Nose/Throat:  []  Change in hearing? []  Nose bleeds? []  Sore throat?  Musculoskeletal:  []  Back pain? []  Joint pain? []  Muscle pain?  PHYSICAL EXAM:    Vitals:   09/16/16 0630 09/16/16 0645 09/16/16 0700 09/16/16 0715  BP:      Pulse: 71 70 69 69  Resp: 14 16 16  (!) 23  Temp:      TempSrc:      SpO2: 95% 96% 96% 95%  Weight:      Height:        GENERAL: The patient is a well-nourished male, in no acute distress. The vital signs are documented above. HEENT: normocephalic, atraumatic, no abnormalities noted.  CARDIAC: There is a regular rate and rhythm. No carotid bruits.  VASCULAR: Palpable radial pulses, femoral pulses, faintly palpable dorsalis pedis pulses.  PULMONARY: Non labored breathing effort.  ABDOMEN: Soft and non-tender, no distension  with normal pitched bowel sounds.  MUSCULOSKELETAL: There are no major deformities or cyanosis. NEUROLOGIC: No focal weakness or paresthesias are detected. SKIN: There are no ulcers or rashes noted. PSYCHIATRIC: The patient has a normal affect. LYMPH:  No Cervical, Axillary, or Inguinal lymphadenopathy    DATA:    CT ANGIOGRAPHY CHEST, ABDOMEN AND PELVIS 09/16/2016  TECHNIQUE: Multidetector CT imaging through the chest, abdomen  and pelvis was performed using the standard protocol during bolus administration of intravenous contrast. Multiplanar reconstructed images and MIPs were obtained and reviewed to evaluate the vascular anatomy.  CONTRAST: 80 mL Isovue 370  COMPARISON: None.  FINDINGS: CTA CHEST FINDINGS  Cardiovascular: Precontrast imaging of the chest shows intramural hematoma within the distal aortic arch extending to the midthoracic descending aorta. There is a dissection flap that begins distal to the left subclavian artery origin and extends inferiorly to the level of the superior mesenteric artery. There is no extension into the arch vessels, the celiac axis or the superior mesenteric artery. The central pulmonary arteries are normal. There is atherosclerotic calcification within the coronary arteries. No pericardial effusion. The heart is enlarged.  Mediastinum/Nodes: No enlarged mediastinal, hilar, or axillary lymph nodes. Thyroid gland, trachea, and esophagus demonstrate no significant findings.  Lungs/Pleura: Small left pleural effusion and associated atelectasis.  Musculoskeletal: No chest wall abnormality. No acute or significant osseous findings.  Review of the MIP images confirms the above findings.  CTA ABDOMEN AND PELVIS FINDINGS  VASCULAR  Aorta: As stated above, aortic dissection extends to the level of the celiac axis and superior mesenteric artery without extension into the major abdominal branches.  Celiac: Patent without  evidence of aneurysm, dissection, vasculitis or significant stenosis.  SMA: Patent without evidence of aneurysm, dissection, vasculitis or significant stenosis.  Renals: Both renal arteries are patent without evidence of aneurysm, dissection, vasculitis, fibromuscular dysplasia or significant stenosis.  IMA: Patent without evidence of aneurysm, dissection, vasculitis or significant stenosis.  Inflow: Patent without evidence of aneurysm, dissection, vasculitis or significant stenosis.  Veins: No obvious venous abnormality within the limitations of this arterial phase study.  Review of the MIP images confirms the above findings.  NON-VASCULAR  Hepatobiliary: No focal liver abnormality is seen. No gallstones, gallbladder wall thickening, or biliary dilatation.  Pancreas: Unremarkable. No pancreatic ductal dilatation or surrounding inflammatory changes.  Spleen: Normal in size without focal abnormality.  Adrenals/Urinary Tract: Adrenal glands are unremarkable. Kidneys are normal, without renal calculi, focal lesion, or hydronephrosis. Bladder is unremarkable.  Stomach/Bowel: Stomach is within normal limits. Appendix appears normal. No evidence of bowel wall thickening, distention, or inflammatory changes. Mild edema/fluid adjacent to the ascending colon. No wall thickening.  Lymphatic: Numerous subcentimeter retroperitoneal lymph nodes.  Reproductive: Normal prostate and seminal vesicles.  Other: No abdominal wall hernia or abnormality. No abdominopelvic ascites.  Musculoskeletal: No acute or significant osseous findings.  Review of the MIP images confirms the above findings.  IMPRESSION: 1. Acute aortic dissection (Stanford type B) extending from just distal to the left subclavian artery origin to the level of the superior mesenteric artery. No extension into the branch vessels of the aorta. 2. Associated acute intramural hematoma within the  distal aortic arch and proximal descending thoracic aorta. 3. Small left pleural effusion and associated atelectasis. 4. Cardiomegaly and coronary artery calcification. Critical Value/emergent results were called by telephone at the time of interpretation on 09/16/2016 at 4:35 am to Dr. Rochele Raring , who verbally acknowledged these results.  ASSESSMENT/PLAN:     1. Hypertensive Emergency leading to 2. Active Cocaine Abuse leading to 3. Acute type B aortic dissection 4. CAD s/p PCI for MI 5. Prior CVA 6. Chronic kidney disease stage III-IV  -Patient with history of poorly controlled hypertension, CAD with MI (2014), chronic kidney disease, cocaine abuse and previous stroke -Currently no evidence of mesenteric ischemia or limb ischemia -Continue strict blood pressure control. No current plans for surgical  intervention. -Acute on chronic CKD: Creatinine is elevated. Did receive contrast from CTA. Continue hydration.   Maris Berger, PA-C [facility] [phone]  Addendum  I have independently interviewed and examined the patient, and I agree with the physician assistant's findings.  Pt found to be +Cocaine on UDS with uncontrolled HTN.  CTA sub-optimal with more granularity that usual.  I can still see a dissection from distal L SCA to level of CA evident.  I don't see obvious extension of dissection past level of CA and I don't see extension into SMA or renal arteries.    Pt had no abd tenderness on palpation with easily palpable pulses in both femoral and radial arteries.  I don't see any evidence of mesenteric or extremity ischemia at this point, thus this patient has an uncomplicated acute aortic dissection.  - At this point, maximal medical mgmt with impulse control, currently on labetolol drip. - Eventually pt will need to be titrated on an aggressive oral anti-HTN regimen.   - Pt needs to be engage in drug counseling as this is likely contributory  to his severe HTN - Unfortunately, I suspect with continued drug use and poor medical compliance, the aortic dissection is likely to extend further, so pt may need TEVAR placement to seal proximal tear if any complications develop from the aortic dissection - Will continue to follow with you   Leonides Sake, MD, FACS [facility] Office: [phone] Pager: [phone]  09/16/2016, 10:13 AM

## 2016-09-16 NOTE — Progress Notes (Signed)
Patient came from ED with 1 PIV.  MD ordered cardene and labetalol.  RN attempted to gain another PIV wit no success. Two more attempts were made -- one by RN and one by an MD with no success.  IV team consult placed.
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
## 2016-09-16 NOTE — Progress Notes (Addendum)
PULMONARY / CRITICAL CARE MEDICINE   Name: Austin Simmons MRN: [id number] DOB: [birth year]    ADMISSION DATE:  09/15/2016  CHIEF COMPLAINT:  Chest pain 2-3 days  Brief patient summary:   56 year old male with PMH of CAD s/p MI 2014, ischemic cardiomyopathy, CVA unknown deficits, CKD, medical noncompliance and cocaine use presented to the emergency department on 8/26 hypertensive at 243/147 with severe chest pain, 9 out of 10, located mid chest near the xiphoid radiating into his abdomen is continuous is minimally better with opioids. The pain has been going on for 2-3 days he hasn't had pain like this before. He denies use of cocaine. He says he has not taken antihypertensive medications in quite some time.  CTA shows showed acute type B aortic dissection.  SUBJECTIVE:  Per RN, no acute events Patient continues to complain of chest pain labetalol gtt off  Cardene at 8 mg/hr   VITAL SIGNS: BP 108/66   Pulse 65   Temp 98.4 F (36.9 C) (Oral)   Resp 19   Ht 5\' 5"  (1.651 m)   Wt 175 lb 14.8 oz (79.8 kg)   SpO2 90%   BMI 29.28 kg/m   HEMODYNAMICS:   VENTILATOR SETTINGS:   INTAKE / OUTPUT: I/O last 3 completed shifts: In: 900 [I.V.:900] Out: 110 [Urine:110]  PHYSICAL EXAMINATION: General:  Adult male sitting up in bed in NAD HEENT: MM pink/moist, PERRL, scleral anicteric  Neuro: Sleepy, awakens easily, oriented to person and place, slurred speech, MAE CV: SR 60's rrr, no m/r/g PULM: even/non-labored, lungs bilaterally clear, diminished in bases, on RA 98% GI: protuberant, soft, bs active  Extremities: warm/dry, no edema  Skin: no rashes or lesions  LABS:  BMET  Recent Labs Lab 09/15/16 2232 09/16/16 0607  NA 128* 135  K 2.9* 3.0*  CL 92* 99*  CO2 25 24  BUN 54* 48*  CREATININE 4.02* 3.57*  GLUCOSE 125* 119*    Electrolytes  Recent Labs Lab 09/15/16 2232 09/16/16 0607  CALCIUM 8.0* 7.7*  MG  --  1.7  PHOS  --  3.2    CBC  Recent Labs Lab  09/15/16 2232 09/16/16 0607  WBC 12.2* 10.5  HGB 9.7* 9.2*  HCT 31.6* 29.0*  PLT 298 256    Coag's No results for input(s): APTT, INR in the last 168 hours.  Sepsis Markers No results for input(s): LATICACIDVEN, PROCALCITON, O2SATVEN in the last 168 hours.  ABG No results for input(s): PHART, PCO2ART, PO2ART in the last 168 hours.  Liver Enzymes No results for input(s): AST, ALT, ALKPHOS, BILITOT, ALBUMIN in the last 168 hours.  Cardiac Enzymes  Recent Labs Lab 09/15/16 2232 09/16/16 0607  TROPONINI 0.27* 0.24*    Glucose No results for input(s): GLUCAP in the last 168 hours.  Imaging Dg Chest 2 View  Result Date: 09/15/2016 CLINICAL DATA:  Chest pain for 2 days.  History of hypertension. EXAM: CHEST  2 VIEW COMPARISON:  Chest radiograph December 28, 2015 FINDINGS: Cardiac silhouette is moderately enlarged and unchanged. Mediastinal silhouette is nonsuspicious. Mild bronchitic changes. Small LEFT pleural effusion. No pneumothorax. Soft tissue planes and included osseous structures are nonsuspicious. IMPRESSION: Stable cardiomegaly. Mild bronchitic changes and small LEFT pleural effusion. Electronically Signed   By: Awilda Metro M.D.   On: 09/15/2016 23:43   Ct Angio Chest/abd/pel For Dissection W And/or Wo Contrast  Result Date: 09/16/2016 CLINICAL DATA:  Chest pain radiating to the back. EXAM: CT ANGIOGRAPHY CHEST, ABDOMEN AND PELVIS TECHNIQUE: Multidetector  CT imaging through the chest, abdomen and pelvis was performed using the standard protocol during bolus administration of intravenous contrast. Multiplanar reconstructed images and MIPs were obtained and reviewed to evaluate the vascular anatomy. CONTRAST:  80 mL Isovue 370 COMPARISON:  None. FINDINGS: CTA CHEST FINDINGS Cardiovascular: Precontrast imaging of the chest shows intramural hematoma within the distal aortic arch extending to the midthoracic descending aorta. There is a dissection flap that begins distal to  the left subclavian artery origin and extends inferiorly to the level of the superior mesenteric artery. There is no extension into the arch vessels, the celiac axis or the superior mesenteric artery. The central pulmonary arteries are normal. There is atherosclerotic calcification within the coronary arteries. No pericardial effusion. The heart is enlarged. Mediastinum/Nodes: No enlarged mediastinal, hilar, or axillary lymph nodes. Thyroid gland, trachea, and esophagus demonstrate no significant findings. Lungs/Pleura: Small left pleural effusion and associated atelectasis. Musculoskeletal: No chest wall abnormality. No acute or significant osseous findings. Review of the MIP images confirms the above findings. CTA ABDOMEN AND PELVIS FINDINGS VASCULAR Aorta: As stated above, aortic dissection extends to the level of the celiac axis and superior mesenteric artery without extension into the major abdominal branches. Celiac: Patent without evidence of aneurysm, dissection, vasculitis or significant stenosis. SMA: Patent without evidence of aneurysm, dissection, vasculitis or significant stenosis. Renals: Both renal arteries are patent without evidence of aneurysm, dissection, vasculitis, fibromuscular dysplasia or significant stenosis. IMA: Patent without evidence of aneurysm, dissection, vasculitis or significant stenosis. Inflow: Patent without evidence of aneurysm, dissection, vasculitis or significant stenosis. Veins: No obvious venous abnormality within the limitations of this arterial phase study. Review of the MIP images confirms the above findings. NON-VASCULAR Hepatobiliary: No focal liver abnormality is seen. No gallstones, gallbladder wall thickening, or biliary dilatation. Pancreas: Unremarkable. No pancreatic ductal dilatation or surrounding inflammatory changes. Spleen: Normal in size without focal abnormality. Adrenals/Urinary Tract: Adrenal glands are unremarkable. Kidneys are normal, without renal  calculi, focal lesion, or hydronephrosis. Bladder is unremarkable. Stomach/Bowel: Stomach is within normal limits. Appendix appears normal. No evidence of bowel wall thickening, distention, or inflammatory changes. Mild edema/fluid adjacent to the ascending colon. No wall thickening. Lymphatic: Numerous subcentimeter retroperitoneal lymph nodes. Reproductive: Normal prostate and seminal vesicles. Other: No abdominal wall hernia or abnormality. No abdominopelvic ascites. Musculoskeletal: No acute or significant osseous findings. Review of the MIP images confirms the above findings. IMPRESSION: 1. Acute aortic dissection (Stanford type B) extending from just distal to the left subclavian artery origin to the level of the superior mesenteric artery. No extension into the branch vessels of the aorta. 2. Associated acute intramural hematoma within the distal aortic arch and proximal descending thoracic aorta. 3. Small left pleural effusion and associated atelectasis. 4. Cardiomegaly and coronary artery calcification. Critical Value/emergent results were called by telephone at the time of interpretation on 09/16/2016 at 4:35 am to Dr. Rochele Raring , who verbally acknowledged these results. Electronically Signed   By: Deatra Robinson M.D.   On: 09/16/2016 04:48   STUDIES:  CTA 8/27>> acute aortic dissection type B extending from just distal to the left subclavian artery origin to the level of superior mesenteric artery; no extension into the branch vessels of the aorta.  Associated acute intramural hematoma w/in the distal aortic arch and proximal descending thoracic aorta; small left pleural effusion and associated atelectasis.  Cardiomegaly and coronary artery calcification TTE >>  CULTURES: MRSA 8/27 >> positive  ANTIBIOTICS: None  SIGNIFICANT EVENTS: Admitted to the ICU 8/27  LINES/TUBES: Left radial A-line 8/26 >>  DISCUSSION: 56 year old man presented with severe chest pain and abdominal pain with  past medical history of multivessel coronary artery disease, untreated hypertension, cocaine use admitted to the ICU with acute type B aortic dissection and hypertensive emergency  ASSESSMENT / PLAN:  PULMONARY A: No acute problem P:   Monitor respiratory status  CARDIOVASCULAR A:  Hypertensive emergency secondary to medical noncompliance and UDS + cocaine Type B aortic dissection- r//t to above Elevated troponins Hx CAD, MI w/PCI, uncontrolled HTN, ICM w/EF 40-45 5/15, HLD P:  TCTS, Vascular, and Cards following, appreciate recs Surgery not indicated at this time Continue with aggressive BP control- goal SBP ~120 Continue Aline  Restart clonidine 0.1 mg BID , labetalol 200 mg BID, hydralazine 25 mg TID D/c Labetalol drip Wean Nicardipine drip  PRN labetalol and apresoline  TTE pending Trending troponin Hold on ACE or ARB given renal fxn  RENAL A:   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- baseline sCr 2.5 Risk of acute contrast nephropathy s/p CTA Hyponatremia, hypokalemia P:   KCL x 4 runs 8/27 Continue with normal saline 100 mL per hour for 16 hours Trend BMP / strict I/O Replace electrolytes as indicated Consider Nephrology consult if AKI progresses, although given his hx of noncompliance, unsure if he would be a dialysis candidate   GASTROINTESTINAL A:   No active issue P:   Renal diet PPI for SUP  HEMATOLOGIC A:   DVT prophylaxis P:     SCDs  INFECTIOUS A:   No active issue P:   Monitor   ENDOCRINE A:   Hx Hypothyroid P:   Monitor glucose on BMP Start levothyroxine 50 mcg daily  NEUROLOGIC A:   Pain and anxiety Hx CVA P:   PRN fentanyl and ativan   FAMILY  - Updates: none available.  - Inter-disciplinary family meet or Palliative Care meeting due by:  09/24/26  CCT: 35 mins  Posey Boyer, AGACNP-BC [facility] Pgr: [phone] or if no answer [phone] 09/16/2016, 11:05 AM  Independently examined pt, evaluated data  & formulated above care plan with NP   56 year old man with CAD, history cardiomyopathy and CK D admitted with hypertensive crisis and type B aortic dissection distal to left subclavian and above celiac axis in the setting of cocaine use. Blood pressure has been controlled with labetalol and Cardene drips. On exam, alert and awake, clear lungs, no S3, no edema or JVD, clear lungs  Labs reviewed and imaging Recommend Hypertensive crisis- resume oral clonidine, hydralazine. Instead of home on metoprolol, use labetalol 200 mg by mouth twice a day and taper off labetalol and Cardene drips  Aortic dissection-conservative management with blood pressure control If he develops abdominal pain consider CT abdomen and vascular consult  CKD , at risk AK I due to contrast-hydrated gently  The patient is critically ill with multiple organ systems failure and requires high complexity decision making for assessment and support, frequent evaluation and titration of therapies, application of advanced monitoring technologies and extensive interpretation of multiple databases. Critical Care Time devoted to patient care services described in this note independent of APP time is 32 minutes.    Oretha Milch MD
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
## 2016-09-16 NOTE — Progress Notes (Signed)
Left VM for [place] at [phone] per patient's request.

## 2016-09-16 NOTE — ED Notes (Signed)
[phone] 

## 2016-09-16 NOTE — Progress Notes (Signed)
eLink Physician-Brief Progress Note Patient Name: Austin Simmons DOB: May 27, 1960 MRN: [id number]   Date of Service  09/16/2016  HPI/Events of Note  constipation  eICU Interventions  Dulcolax prn     Intervention Category Minor Interventions: Routine modifications to care plan (e.g. PRN medications for pain, fever)  Max Fickle 09/16/2016, 9:06 PM

## 2016-09-16 NOTE — ED Notes (Signed)
Patient transported to CT 

## 2016-09-16 NOTE — Progress Notes (Signed)
CT surgery p.m. Rounds  Status post Stanford-type be thoracic aortic dissection Blood pressure stable with prny IV hydralazine, labetalol Moves all extremities Patient denies pain but is concerned over visiting his family Taking liquids satisfactorily

## 2016-09-16 NOTE — Procedures (Signed)
Arterial Catheter Insertion Procedure Note Austin Simmons [id number] [birth year]  Procedure: Insertion of Arterial Catheter  Indications: Blood pressure monitoring  Procedure Details Consent: Risks of procedure as well as the alternatives and risks of each were explained to the (patient/caregiver).  Consent for procedure obtained. Time Out: Verified patient identification, verified procedure, site/side was marked, verified correct patient position, special equipment/implants available, medications/allergies/relevent history reviewed, required imaging and test results available.  Performed  Maximum sterile technique was used including antiseptics, cap, gloves, gown, hand hygiene, mask and sheet. Skin prep: Chlorhexidine; local anesthetic administered 20 gauge catheter was inserted into left radial artery using the Seldinger technique.  Evaluation Blood flow good; BP tracing good. Complications: No apparent complications.   Caro Laroche 09/16/2016

## 2016-09-16 NOTE — ED Notes (Signed)
CCM at bedside 

## 2016-09-16 NOTE — H&P (Signed)
PULMONARY / CRITICAL CARE MEDICINE   Name: Austin Simmons MRN: [id number] DOB: 1960-08-23    ADMISSION DATE:  09/15/2016  CHIEF COMPLAINT:  Chest pain 2-3 days  HISTORY OF PRESENT ILLNESS:   56 year old man coronary artery disease history ischemic cardiomyopathy, history of CVA unknown deficits history of cocaine use presented to the emergency department with severe chest pain he says it is a 9 out of 10 is located mid chest near the xiphoid it radiates into his abdomen is continuous is minimally better with opioids. The pain has been going on for 2-3 days he hasn't had pain like this before. He denies use of cocaine. He says he has not taken antihypertensive medications in quite some time.  PAST MEDICAL HISTORY :  He  has a past medical history of Anxiety; Arthritis; Carotid stenosis; Chronic lower back pain; Coronary artery disease; GERD (gastroesophageal reflux disease); echocardiogram; Hypertension; Ischemic cardiomyopathy; MI (myocardial infarction) (HCC) (12/2010); Migraines; Polysubstance abuse; Skull fracture (HCC) (1974); Stroke [facility]) (1990's); and Thyroid disease.  PAST SURGICAL HISTORY: He  has a past surgical history that includes Shoulder surgery (Left, 1974); Finger fracture surgery (Left, 1974); Back surgery (1989); Patella fracture surgery (Bilateral, 1989); Cystoscopy/retrograde/ureteroscopy/stone extraction with basket (10/20/2009); left heart catheterization with coronary angiogram (N/A, 08/13/2012); percutaneous coronary stent intervention (pci-s) (08/13/2012); left heart catheterization with coronary angiogram (N/A, 04/07/2014); and percutaneous coronary stent intervention (pci-s) (04/07/2014).  Allergies  Allergen Reactions  . Penicillins Rash         No current facility-administered medications on file prior to encounter.    Current Outpatient Prescriptions on File Prior to Encounter  Medication Sig  . nitroGLYCERIN (NITROSTAT) 0.4 MG SL tablet Place 1 tablet (0.4 mg  total) under the tongue every 5 (five) minutes as needed. Chest pain  . acetaminophen-codeine (TYLENOL #2) 300-15 MG per tablet Take 1 tablet by mouth every 8 (eight) hours as needed for moderate pain. (Patient not taking: Reported on 09/16/2016)  . atorvastatin (LIPITOR) 80 MG tablet TAKE 1 TABLET BY MOUTH DAILY AT 6 PM (Patient not taking: Reported on 09/16/2016)  . baclofen (LIORESAL) 10 MG tablet Take 1 tablet (10 mg total) by mouth at bedtime. (Patient not taking: Reported on 09/16/2016)  . buPROPion (WELLBUTRIN) 100 MG tablet Take 1 tablet (100 mg total) by mouth 2 (two) times daily. (Patient not taking: Reported on 09/16/2016)  . cloNIDine (CATAPRES) 0.1 MG tablet TAKE 1 TABLET BY MOUTH TWICE DAILY (Patient not taking: Reported on 09/16/2016)  . clopidogrel (PLAVIX) 75 MG tablet Take 1 tablet (75 mg total) by mouth daily with breakfast. (Patient not taking: Reported on 09/16/2016)  . hydrALAZINE (APRESOLINE) 25 MG tablet Take 1 tablet (25 mg total) by mouth 3 (three) times daily. (Patient not taking: Reported on 09/16/2016)  . levothyroxine (SYNTHROID, LEVOTHROID) 50 MCG tablet Take 1 tablet (50 mcg total) by mouth daily. (Patient not taking: Reported on 09/16/2016)  . losartan (COZAAR) 100 MG tablet Take 1 tablet (100 mg total) by mouth daily. (Patient not taking: Reported on 09/16/2016)  . metoprolol (LOPRESSOR) 100 MG tablet TAKE 1 TABLET BY MOUTH TWICE DAILY (Patient not taking: Reported on 09/16/2016)  . pantoprazole (PROTONIX) 40 MG tablet Take 1 tablet (40 mg total) by mouth daily. (Patient not taking: Reported on 09/16/2016)  . spironolactone (ALDACTONE) 25 MG tablet TAKE 1 TABLET BY MOUTH EVERY DAY (Patient not taking: Reported on 09/16/2016)  . traMADol (ULTRAM) 50 MG tablet Take 1 tablet (50 mg total) by mouth every 8 (eight) hours as  needed for moderate pain. (Patient not taking: Reported on 09/16/2016)    FAMILY HISTORY:  His indicated that his mother is deceased. He indicated that his father  is deceased. He indicated that all of his five sisters are alive. He indicated that three of his five brothers are alive.    SOCIAL HISTORY: He  reports that he has quit smoking. His smoking use included Cigarettes. He started smoking about 2 years ago. He has a 30.00 pack-year smoking history. He has never used smokeless tobacco. He reports that he uses drugs, including Marijuana. He reports that he does not drink alcohol.  REVIEW OF SYSTEMS:   Endorses anxiety, nausea, lower extremity edema, left foot pain. No palpitations no syncopeno vomiting no diarrhea No orthopnea no PND no fevers no chills. All other systems reviewed and negative  VITAL SIGNS: BP (!) 203/88   Pulse (!) 103   Temp 97.7 F (36.5 C) (Oral)   Resp (!) 25   Ht 5\' 5"  (1.651 m)   Wt 175 lb (79.4 kg)   SpO2 96%   BMI 29.12 kg/m   HEMODYNAMICS:    VENTILATOR SETTINGS:    INTAKE / OUTPUT: No intake/output data recorded.  PHYSICAL EXAMINATION: General:  Appears uncomfortable and restless in bed older than stated age, unkempt Neuro:  Conscious alert and oriented 3 was Ultram is no focal deficits HEENT:  EOMI, PERRLA, ATNC, MO MM Cardiovascular:  Tachycardic regular no murmur no clubbing no cyanosis, left lower extremity edema 2+ Lungs:  Clear to auscultation no wheezing no rales Abdomen:  Protuberant distended and soft guarding not rigid no hepatosplenomegaly no masses Musculoskeletal:  No red warm swollen tender or deformed joints Skin: Warm dry no rash  LABS:  BMET  Recent Labs Lab 09/15/16 2232  NA 128*  K 2.9*  CL 92*  CO2 25  BUN 54*  CREATININE 4.02*  GLUCOSE 125*    Electrolytes  Recent Labs Lab 09/15/16 2232  CALCIUM 8.0*    CBC  Recent Labs Lab 09/15/16 2232  WBC 12.2*  HGB 9.7*  HCT 31.6*  PLT 298    Coag's No results for input(s): APTT, INR in the last 168 hours.  Sepsis Markers No results for input(s): LATICACIDVEN, PROCALCITON, O2SATVEN in the last 168  hours.  ABG No results for input(s): PHART, PCO2ART, PO2ART in the last 168 hours.  Liver Enzymes No results for input(s): AST, ALT, ALKPHOS, BILITOT, ALBUMIN in the last 168 hours.  Cardiac Enzymes  Recent Labs Lab 09/15/16 2232  TROPONINI 0.27*    Glucose No results for input(s): GLUCAP in the last 168 hours.  Imaging Dg Chest 2 View  Result Date: 09/15/2016 CLINICAL DATA:  Chest pain for 2 days.  History of hypertension. EXAM: CHEST  2 VIEW COMPARISON:  Chest radiograph December 28, 2015 FINDINGS: Cardiac silhouette is moderately enlarged and unchanged. Mediastinal silhouette is nonsuspicious. Mild bronchitic changes. Small LEFT pleural effusion. No pneumothorax. Soft tissue planes and included osseous structures are nonsuspicious. IMPRESSION: Stable cardiomegaly. Mild bronchitic changes and small LEFT pleural effusion. Electronically Signed   By: Awilda Metro M.D.   On: 09/15/2016 23:43   Ct Angio Chest/abd/pel For Dissection W And/or Wo Contrast  Result Date: 09/16/2016 CLINICAL DATA:  Chest pain radiating to the back. EXAM: CT ANGIOGRAPHY CHEST, ABDOMEN AND PELVIS TECHNIQUE: Multidetector CT imaging through the chest, abdomen and pelvis was performed using the standard protocol during bolus administration of intravenous contrast. Multiplanar reconstructed images and MIPs were obtained and reviewed to  evaluate the vascular anatomy. CONTRAST:  80 mL Isovue 370 COMPARISON:  None. FINDINGS: CTA CHEST FINDINGS Cardiovascular: Precontrast imaging of the chest shows intramural hematoma within the distal aortic arch extending to the midthoracic descending aorta. There is a dissection flap that begins distal to the left subclavian artery origin and extends inferiorly to the level of the superior mesenteric artery. There is no extension into the arch vessels, the celiac axis or the superior mesenteric artery. The central pulmonary arteries are normal. There is atherosclerotic calcification  within the coronary arteries. No pericardial effusion. The heart is enlarged. Mediastinum/Nodes: No enlarged mediastinal, hilar, or axillary lymph nodes. Thyroid gland, trachea, and esophagus demonstrate no significant findings. Lungs/Pleura: Small left pleural effusion and associated atelectasis. Musculoskeletal: No chest wall abnormality. No acute or significant osseous findings. Review of the MIP images confirms the above findings. CTA ABDOMEN AND PELVIS FINDINGS VASCULAR Aorta: As stated above, aortic dissection extends to the level of the celiac axis and superior mesenteric artery without extension into the major abdominal branches. Celiac: Patent without evidence of aneurysm, dissection, vasculitis or significant stenosis. SMA: Patent without evidence of aneurysm, dissection, vasculitis or significant stenosis. Renals: Both renal arteries are patent without evidence of aneurysm, dissection, vasculitis, fibromuscular dysplasia or significant stenosis. IMA: Patent without evidence of aneurysm, dissection, vasculitis or significant stenosis. Inflow: Patent without evidence of aneurysm, dissection, vasculitis or significant stenosis. Veins: No obvious venous abnormality within the limitations of this arterial phase study. Review of the MIP images confirms the above findings. NON-VASCULAR Hepatobiliary: No focal liver abnormality is seen. No gallstones, gallbladder wall thickening, or biliary dilatation. Pancreas: Unremarkable. No pancreatic ductal dilatation or surrounding inflammatory changes. Spleen: Normal in size without focal abnormality. Adrenals/Urinary Tract: Adrenal glands are unremarkable. Kidneys are normal, without renal calculi, focal lesion, or hydronephrosis. Bladder is unremarkable. Stomach/Bowel: Stomach is within normal limits. Appendix appears normal. No evidence of bowel wall thickening, distention, or inflammatory changes. Mild edema/fluid adjacent to the ascending colon. No wall thickening.  Lymphatic: Numerous subcentimeter retroperitoneal lymph nodes. Reproductive: Normal prostate and seminal vesicles. Other: No abdominal wall hernia or abnormality. No abdominopelvic ascites. Musculoskeletal: No acute or significant osseous findings. Review of the MIP images confirms the above findings. IMPRESSION: 1. Acute aortic dissection (Stanford type B) extending from just distal to the left subclavian artery origin to the level of the superior mesenteric artery. No extension into the branch vessels of the aorta. 2. Associated acute intramural hematoma within the distal aortic arch and proximal descending thoracic aorta. 3. Small left pleural effusion and associated atelectasis. 4. Cardiomegaly and coronary artery calcification. Critical Value/emergent results were called by telephone at the time of interpretation on 09/16/2016 at 4:35 am to Dr. Rochele Raring , who verbally acknowledged these results. Electronically Signed   By: Deatra Robinson M.D.   On: 09/16/2016 04:48     STUDIES:  CTA 8/27>> acute aortic dissection type B  CULTURES: None  ANTIBIOTICS: None  SIGNIFICANT EVENTS: Admitted to the ICU 8/27  LINES/TUBES: Left radial A-line  DISCUSSION: 56 year old man presented with severe chest pain and abdominal pain with past medical history of multivessel coronary artery disease, untreated hypertension, cocaine use admitted to the medical ICU with diagnosis of acute type B aortic dissection and hypertensive emergency  ASSESSMENT / PLAN:  PULMONARY A: No acute problem P:   Monitor respiratory status  CARDIOVASCULAR A:  Hypertensive emergency Type B aortic dissection P:  Cardiothoracic surgery Dr. Cornelius Moras consulted Vascular surgery Dr. 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 Risk of acute contrast nephropathy Hyponatremia, hypokalemia P:   Continue with normal saline 100 mL per hour for 12 hours Monitor renal  function Foley catheter for strict I/O Replace lytes  GASTROINTESTINAL A:   No active issue P:    Nothing by mouth  HEMATOLOGIC A:   DVT prophylaxis P:     SCDs  INFECTIOUS A:   No active issue P:   No ABX  ENDOCRINE A:   No active issue    P:   Monitor glucose on BMP  NEUROLOGIC A:   Pain and anxiety P:   RASS goal: 0 when necessary fentanyl and Ativan  FAMILY  - Updates: none available  - Inter-disciplinary family meet or Palliative Care meeting due by:  09/24/26  Upon my evaluation, this patient had a high probability of imminent or life-threatening deterioratiohypertensive emergency aortic dissection  high complexity decision making to assess, manipulate, and support vital organ system failure including invasive blood pressure monitoring  I have personally provided 60 minutes of critical care time exclusive of time spent on separately billable procedures and education. Time includes review and summation of previous medical record, laboratory data, radiology results, independent review of CT, CXR, coordination of care with ED-MD, RN, thoracic surgeon, vascular surgeon, and monitoring for potential decompensation. Interventions were performed as documented above.  Condition: Critical  Prognosis: Guarded  Code Status: Full  Caro Laroche, MD Critical Care Medicine [facility] Pager: [phone]  09/16/2016, 2:49 AM

## 2016-09-16 NOTE — Care Management Note (Addendum)
Case Management Note  Patient Details  Name: Austin Simmons MRN: [id number] Date of Birth: 1960-09-27  Subjective/Objective:  From home presents with chest pain and hypertensive crisis with hx of  cocaine use and medical non-compliance. Cardiothoracic consulted for aortic dissection. Patient is very dysarthric, he states he has two sons, [name] and [name].  He states a friend brought him to the hospital.  He may need transportation assistance at discharge.  He has no PCP, looks like he has been to the [facility] before, [facility].                    Action/Plan: NCM will follow for dc needs.   Expected Discharge Date:                  Expected Discharge Plan:     In-House Referral:     Discharge planning Services  CM Consult  Post Acute Care Choice:    Choice offered to:     DME Arranged:    DME Agency:     HH Arranged:    HH Agency:     Status of Service:  In process, will continue to follow  If discussed at Long Length of Stay Meetings, dates discussed:    Additional Comments:  Leone Haven, RN 09/16/2016, 10:36 AM

## 2016-09-16 NOTE — ED Notes (Signed)
Pt requesting pain medication. Provider notified.

## 2016-09-17 ENCOUNTER — Inpatient Hospital Stay (HOSPITAL_COMMUNITY): Payer: Medicare Other

## 2016-09-17 DIAGNOSIS — R072 Precordial pain: Secondary | ICD-10-CM

## 2016-09-17 DIAGNOSIS — N179 Acute kidney failure, unspecified: Secondary | ICD-10-CM

## 2016-09-17 DIAGNOSIS — N189 Chronic kidney disease, unspecified: Secondary | ICD-10-CM

## 2016-09-17 LAB — ECHOCARDIOGRAM COMPLETE
AVLVOTPG: 8 mmHg
EERAT: 17.86
EWDT: 215 ms
FS: 22 % — AB (ref 28–44)
Height: 65 in
IVS/LV PW RATIO, ED: 1.02
LA ID, A-P, ES: 43 mm
LA diam end sys: 43 mm
LA vol A4C: 110 ml
LA vol index: 58.2 mL/m2
LA vol: 116 mL
LADIAMINDEX: 2.16 cm/m2
LV TDI E'LATERAL: 4.87
LV TDI E'MEDIAL: 5.27
LV dias vol index: 71 mL/m2
LV e' LATERAL: 4.87 cm/s
LV sys vol: 70 mL — AB (ref 21–61)
LVDIAVOL: 141 mL (ref 62–150)
LVEEAVG: 17.86
LVEEMED: 17.86
LVOT SV: 95 mL
LVOT VTI: 22.8 cm
LVOT area: 4.15 cm2
LVOT diameter: 23 mm
LVOTPV: 142 cm/s
LVSYSVOLIN: 35 mL/m2
MV Dec: 215
MVPG: 3 mmHg
MVPKAVEL: 79.3 m/s
MVPKEVEL: 87 m/s
PW: 17.1 mm — AB (ref 0.6–1.1)
RV LATERAL S' VELOCITY: 16.3 cm/s
RV TAPSE: 23.1 mm
Simpson's disk: 50
Stroke v: 71 ml
Weight: 2973.56 oz

## 2016-09-17 LAB — BASIC METABOLIC PANEL
ANION GAP: 10 (ref 5–15)
BUN: 53 mg/dL — ABNORMAL HIGH (ref 6–20)
CHLORIDE: 99 mmol/L — AB (ref 101–111)
CO2: 22 mmol/L (ref 22–32)
CREATININE: 4.5 mg/dL — AB (ref 0.61–1.24)
Calcium: 7.7 mg/dL — ABNORMAL LOW (ref 8.9–10.3)
GFR calc non Af Amer: 14 mL/min — ABNORMAL LOW (ref >60)
GFR, EST AFRICAN AMERICAN: 16 mL/min — AB (ref >60)
Glucose, Bld: 122 mg/dL — ABNORMAL HIGH (ref 65–99)
Potassium: 3.5 mmol/L (ref 3.5–5.1)
SODIUM: 131 mmol/L — AB (ref 135–145)

## 2016-09-17 LAB — CBC
HCT: 27 % — ABNORMAL LOW (ref 39.0–52.0)
HEMOGLOBIN: 8.4 g/dL — AB (ref 13.0–17.0)
MCH: 27.4 pg (ref 26.0–34.0)
MCHC: 31.1 g/dL (ref 30.0–36.0)
MCV: 87.9 fL (ref 78.0–100.0)
Platelets: 212 10*3/uL (ref 150–400)
RBC: 3.07 MIL/uL — AB (ref 4.22–5.81)
RDW: 16.5 % — ABNORMAL HIGH (ref 11.5–15.5)
WBC: 8 10*3/uL (ref 4.0–10.5)

## 2016-09-17 LAB — PROTEIN / CREATININE RATIO, URINE
CREATININE, URINE: 86.74 mg/dL
Protein Creatinine Ratio: 1.29 mg/mg{Cre} — ABNORMAL HIGH (ref 0.00–0.15)
TOTAL PROTEIN, URINE: 112 mg/dL

## 2016-09-17 LAB — IRON AND TIBC
Iron: 18 ug/dL — ABNORMAL LOW (ref 45–182)
SATURATION RATIOS: 4 % — AB (ref 17.9–39.5)
TIBC: 438 ug/dL (ref 250–450)
UIBC: 420 ug/dL

## 2016-09-17 LAB — MAGNESIUM: MAGNESIUM: 1.9 mg/dL (ref 1.7–2.4)

## 2016-09-17 LAB — FERRITIN: Ferritin: 50 ng/mL (ref 24–336)

## 2016-09-17 LAB — SODIUM, URINE, RANDOM: Sodium, Ur: 26 mmol/L

## 2016-09-17 LAB — CREATININE, URINE, RANDOM: Creatinine, Urine: 84.82 mg/dL

## 2016-09-17 MED ORDER — NICARDIPINE HCL IN NACL 20-0.86 MG/200ML-% IV SOLN
3.0000 mg/h | INTRAVENOUS | Status: DC
  Administered 2016-09-17 (×2): 15 mg/h via INTRAVENOUS
  Administered 2016-09-17: 7.5 mg/h via INTRAVENOUS
  Administered 2016-09-17: 15 mg/h via INTRAVENOUS
  Administered 2016-09-17: 5 mg/h via INTRAVENOUS
  Administered 2016-09-17 – 2016-09-18 (×3): 15 mg/h via INTRAVENOUS
  Administered 2016-09-18: 10 mg/h via INTRAVENOUS
  Filled 2016-09-17 (×14): qty 200

## 2016-09-17 MED ORDER — HYDRALAZINE HCL 50 MG PO TABS
50.0000 mg | ORAL_TABLET | Freq: Three times a day (TID) | ORAL | Status: DC
  Administered 2016-09-17 (×3): 50 mg via ORAL
  Filled 2016-09-17 (×3): qty 1

## 2016-09-17 MED ORDER — LABETALOL HCL 300 MG PO TABS
300.0000 mg | ORAL_TABLET | Freq: Two times a day (BID) | ORAL | Status: DC
  Administered 2016-09-17 – 2016-09-21 (×9): 300 mg via ORAL
  Filled 2016-09-17 (×9): qty 1

## 2016-09-17 NOTE — Progress Notes (Signed)
PULMONARY / CRITICAL CARE MEDICINE   Name: Austin Simmons MRN: [id number] DOB: 12-30-1960    ADMISSION DATE:  09/15/2016  CHIEF COMPLAINT:  Chest pain 2-3 days  Brief patient summary:   56 year old male with PMH of CAD s/p MI 2014, ischemic cardiomyopathy, CVA unknown deficits, CKD, medical noncompliance and cocaine use presented to the emergency department on 8/26 hypertensive at 243/147 with severe chest pain, 9 out of 10, located mid chest near the xiphoid radiating into his abdomen is continuous is minimally better with opioids. The pain has been going on for 2-3 days he hasn't had pain like this before. He denies use of cocaine. He says he has not taken antihypertensive medications in quite some time.  CTA shows showed acute type B aortic dissection.  SUBJECTIVE:  Cardene gtt off since noon 8/27 w/ transition to PO antihypertensives RN reports ongoing hypertension and needing frequent PRNs to control BP Tolerating diet Patient has been complaining of ongoing CP but is now s/p prn ativan and denies any CP  VITAL SIGNS: BP 124/69   Pulse 76   Temp 98.7 F (37.1 C) (Oral)   Resp 13   Ht 5\' 5"  (1.651 m)   Wt 185 lb 13.6 oz (84.3 kg)   SpO2 98%   BMI 30.93 kg/m   HEMODYNAMICS:   VENTILATOR SETTINGS:   INTAKE / OUTPUT: I/O last 3 completed shifts: In: 4352.7 [P.O.:1320; I.V.:2632.7; IV Piggyback:400] Out: 535 [Urine:535]  PHYSICAL EXAMINATION: General:  Adult male sitting in recliner, in NAD HEENT: MM pink/dry, PERRL, scleral anicteric  Neuro: Sleepy- post ativan, awakens to verbal, slurred speech, follows commands, MAE, left slightly weaker than right CV: rrr, no m/r/g PULM: even/non-labored, lungs bilaterally clear, diminished in bases GI: protuberant, soft, bs active, NT Extremities: warm/dry, +2 distal pulses, trace BLE edema  Skin: no rashes or lesions  LABS:  BMET  Recent Labs Lab 09/15/16 2232 09/16/16 0607 09/17/16 0328  NA 128* 135 131*  K 2.9* 3.0*  3.5  CL 92* 99* 99*  CO2 25 24 22   BUN 54* 48* 53*  CREATININE 4.02* 3.57* 4.50*  GLUCOSE 125* 119* 122*    Electrolytes  Recent Labs Lab 09/15/16 2232 09/16/16 0607 09/17/16 0328  CALCIUM 8.0* 7.7* 7.7*  MG  --  1.7  --   PHOS  --  3.2  --     CBC  Recent Labs Lab 09/15/16 2232 09/16/16 0607 09/17/16 0328  WBC 12.2* 10.5 8.0  HGB 9.7* 9.2* 8.4*  HCT 31.6* 29.0* 27.0*  PLT 298 256 212    Coag's No results for input(s): APTT, INR in the last 168 hours.  Sepsis Markers No results for input(s): LATICACIDVEN, PROCALCITON, O2SATVEN in the last 168 hours.  ABG No results for input(s): PHART, PCO2ART, PO2ART in the last 168 hours.  Liver Enzymes No results for input(s): AST, ALT, ALKPHOS, BILITOT, ALBUMIN in the last 168 hours.  Cardiac Enzymes  Recent Labs Lab 09/16/16 0607 09/16/16 1024 09/16/16 1617  TROPONINI 0.24* 0.51* 0.96*    Glucose No results for input(s): GLUCAP in the last 168 hours.  Imaging No results found. STUDIES:  CTA 8/27>> acute aortic dissection type B extending from just distal to the left subclavian artery origin to the level of superior mesenteric artery; no extension into the branch vessels of the aorta.  Associated acute intramural hematoma w/in the distal aortic arch and proximal descending thoracic aorta; small left pleural effusion and associated atelectasis.  Cardiomegaly and coronary artery calcification TTE >>  CULTURES: MRSA PCR 8/27 >> positive  ANTIBIOTICS: None  SIGNIFICANT EVENTS: Admitted to the ICU 8/27  LINES/TUBES: Left radial A-line 8/26 >>8/27  DISCUSSION: 56 year old man presented with severe chest pain and abdominal pain with past medical history of multivessel coronary artery disease, untreated hypertension, cocaine use admitted to the ICU with acute type B aortic dissection and hypertensive emergency  ASSESSMENT / PLAN:  PULMONARY A: No acute problem P:   Monitor   CARDIOVASCULAR A:   Hypertensive emergency secondary to medical noncompliance and cocaine abuse Type B aortic dissection- r//t to above Elevated troponins Hx CAD, MI w/PCI, uncontrolled HTN, ICM w/EF 40-45% 5/15, HLD P:  TCTS, Vascular, and Cards following, appreciate recs Surgery not indicated at this time with no mesenteric or limb ischemia  Continue with aggressive BP control- goal SBP ~120 D/c clonidine  Increase labetalol to 300 mg BID Increase hydralazine to 50 mg TID Continue PRN hydralazine and labetalol TTE pending Hold on ACE or ARB given renal fxn If CP or abd pain worsens, consider repeat CT  RENAL A:   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- baseline sCr 2.5 Risk of acute contrast nephropathy s/p CTA Hyponatremia, hypokalemia - UO slowing improving, sCr worse 8/28 P:   Will consult Nephrology  Trend BMP /mag/ phos Strict I/O, daily wts Replace electrolytes as indicated  GASTROINTESTINAL A:   Mild consitpation P:   Renal diet- tolerating dulcolax prn  PPI for SUP  HEMATOLOGIC A:   DVT prophylaxis P:     SCDs  INFECTIOUS A:   No active issue MRSA PCR positive P:   Monitor  Contact precautions per protocol and continue nasal bactroban  ENDOCRINE A:   Hx Hypothyroid P:   Monitor glucose on BMP Continue levothyroxine 50 mcg daily  NEUROLOGIC A:   Pain and anxiety Hx CVA P:   PRN fentanyl and ativan  PT following   FAMILY  - Updates: No family at bedside.   - Inter-disciplinary family meet or Palliative Care meeting due by:  09/24/26  CCT: 35 mins  Posey Boyer, AGACNP-BC [facility] Pgr: [phone] or if no answer [phone] 09/17/2016, 10:06 AM

## 2016-09-17 NOTE — Evaluation (Signed)
Physical Therapy Evaluation Patient Details Name: Austin Simmons MRN: [id number] DOB: 1960/04/11 Today's Date: 09/17/2016   History of Present Illness  56 year old male with PMH of CAD s/p MI 2014, ischemic cardiomyopathy, CVA unknown deficits, CKD, medical noncompliance and cocaine use presented to the emergency department on 8/26 hypertensive at 243/147 with severe chest pain, 9 out of 10, located mid chest near the xiphoid radiating into his abdomen is continuous is minimally better with opioids. The pain has been going on for 2-3 days he hasn't had pain like this before. He denies use of cocaine. He says he has not taken antihypertensive medications in quite some time.  CTA shows showed acute type B aortic dissection.  Clinical Impression  Pt admitted with above diagnosis. Pt currently with functional limitations due to the deficits listed below (see PT Problem List). Pt was able to ambulate with RW with cues for safety due to impulsivity.  States hehas help at home therefore should be able to go home with HHPT f//u and assist by caregivers.   Pt will benefit from skilled PT to increase their independence and safety with mobility to allow discharge to the venue listed below.      Follow Up Recommendations Home health PT;Supervision/Assistance - 24 hour    Equipment Recommendations  Rolling walker with 5" wheels    Recommendations for Other Services       Precautions / Restrictions Precautions Precautions: Fall Restrictions Weight Bearing Restrictions: No      Mobility  Bed Mobility               General bed mobility comments: Pt in chair on arrival  Transfers Overall transfer level: Needs assistance Equipment used: Rolling walker (2 wheeled) Transfers: Sit to/from Stand Sit to Stand: Min guard         General transfer comment: Pt impulsive with movement. Guard assist and cues for safe transitions.  Ambulation/Gait Ambulation/Gait assistance: Min guard;Min  assist;+2 safety/equipment Ambulation Distance (Feet): 290 Feet Assistive device: Rolling walker (2 wheeled) Gait Pattern/deviations: Step-through pattern;Decreased stride length;Drifts right/left   Gait velocity interpretation: at or above normal speed for age/gender General Gait Details: Pt was able to ambulate with RW with impulsivity at times.  Pt needed cues for safety especially in distracting environment.  Pt will benefitfrom use of RW as it does steady pt but pt just needs to slow down.   Stairs            Wheelchair Mobility    Modified Rankin (Stroke Patients Only)       Balance Overall balance assessment: Needs assistance         Standing balance support: Bilateral upper extremity supported;During functional activity Standing balance-Leahy Scale: Poor Standing balance comment: relies on RW for balance              High level balance activites: Direction changes;Turns;Sudden stops High Level Balance Comments: min guard assist with challenges              Pertinent Vitals/Pain Pain Assessment: No/denies pain   VSS Home Living Family/patient expects to be discharged to:: Private residence Living Arrangements: Spouse/significant other;Children (lives with friend who is a Engineer, civil (consulting) and her two sons) Available Help at Discharge: Friend(s);Available 24 hours/day (states between the three, he will have 24 hour care) Type of Home: Mobile home Home Access: Stairs to enter Entrance Stairs-Rails: Right;Left;Can reach both Entrance Stairs-Number of Steps: 6 Home Layout: One level Home Equipment: Walker - 2 wheels;Shower seat;Bedside commode;Hand held  shower head;Grab bars - tub/shower;Cane - quad      Prior Function Level of Independence: Independent               Hand Dominance   Dominant Hand: Right    Extremity/Trunk Assessment   Upper Extremity Assessment Upper Extremity Assessment: Defer to OT evaluation    Lower Extremity  Assessment Lower Extremity Assessment: Generalized weakness    Cervical / Trunk Assessment Cervical / Trunk Assessment: Normal  Communication   Communication: Expressive difficulties  Cognition Arousal/Alertness: Awake/alert Behavior During Therapy: WFL for tasks assessed/performed Overall Cognitive Status: History of cognitive impairments - at baseline                                 General Comments: Impulsivity noted with issues with safety awareness.       General Comments      Exercises General Exercises - Lower Extremity Ankle Circles/Pumps: AROM;Both;10 reps;Seated Long Arc Quad: AROM;Both;10 reps;Seated   Assessment/Plan    PT Assessment Patient needs continued PT services  PT Problem List Decreased activity tolerance;Decreased balance;Decreased mobility;Decreased knowledge of use of DME;Decreased safety awareness;Decreased knowledge of precautions;Cardiopulmonary status limiting activity;Decreased strength       PT Treatment Interventions DME instruction;Gait training;Functional mobility training;Therapeutic exercise;Therapeutic activities;Balance training;Patient/family education;Stair training    PT Goals (Current goals can be found in the Care Plan section)  Acute Rehab PT Goals Patient Stated Goal: to go home PT Goal Formulation: With patient Time For Goal Achievement: 10/01/16 Potential to Achieve Goals: Good    Frequency Min 3X/week   Barriers to discharge        Co-evaluation               AM-PAC PT "6 Clicks" Daily Activity  Outcome Measure Difficulty turning over in bed (including adjusting bedclothes, sheets and blankets)?: None Difficulty moving from lying on back to sitting on the side of the bed? : None Difficulty sitting down on and standing up from a chair with arms (e.g., wheelchair, bedside commode, etc,.)?: A Little Help needed moving to and from a bed to chair (including a wheelchair)?: A Little Help needed walking  in hospital room?: A Little Help needed climbing 3-5 steps with a railing? : Total 6 Click Score: 18    End of Session Equipment Utilized During Treatment: Gait belt Activity Tolerance: Patient limited by fatigue Patient left: in chair;with call bell/phone within reach;with chair alarm set Nurse Communication: Mobility status PT Visit Diagnosis: Unsteadiness on feet (R26.81);Muscle weakness (generalized) (M62.81)    Time: 1610-9604 PT Time Calculation (min) (ACUTE ONLY): 17 min   Charges:   PT Evaluation $PT Eval Moderate Complexity: 1 Mod     PT G Codes:        Fatimah Sundquist,PT Acute Rehabilitation [phone] (pager)   Berline Lopes 09/17/2016, 11:01 AM

## 2016-09-17 NOTE — Progress Notes (Signed)
OT Cancellation Note  Patient Details Name: MARTIAL STATT MRN: [id number] DOB: 06-06-1960   Cancelled Treatment:    Reason Eval/Treat Not Completed: Medical issues which prohibited therapy.  Pt lethargic due to receiving Ativan for anxiety/pain.  Will try back.  Reshonda Koerber [place], OTR/L [phone]   Jeani Hawking M 09/17/2016, 12:05 PM

## 2016-09-17 NOTE — Progress Notes (Signed)
Progress Note  Patient Name: Austin Simmons Date of Encounter: 09/17/2016  Primary Cardiologist: Dr Anne Fu  Subjective   Mild dyspnea; complains of chest and abdominal pain  Inpatient Medications    Scheduled Meds: . Chlorhexidine Gluconate Cloth  6 each Topical Q0600  . cloNIDine  0.1 mg Oral BID  . hydrALAZINE  25 mg Oral TID  . labetalol  200 mg Oral BID  . levothyroxine  50 mcg Oral QAC breakfast  . mupirocin ointment  1 application Nasal BID  . pantoprazole  40 mg Oral Daily  . pneumococcal 23 valent vaccine  0.5 mL Intramuscular Tomorrow-1000   Continuous Infusions: . sodium chloride    . niCARDipine Stopped (09/16/16 1207)   PRN Meds: sodium chloride, bisacodyl, fentaNYL (SUBLIMAZE) injection, hydrALAZINE, labetalol, LORazepam   Vital Signs    Vitals:   09/17/16 0530 09/17/16 0600 09/17/16 0630 09/17/16 0700  BP:  (!) 159/87 (!) 158/79 (!) 151/83  Pulse: 77 77 79 77  Resp: 14 20 19 15   Temp:      TempSrc:      SpO2: 96% 96% 96% 97%  Weight:      Height:        Intake/Output Summary (Last 24 hours) at 09/17/16 0721 Last data filed at 09/17/16 0700  Gross per 24 hour  Intake          3452.66 ml  Output              425 ml  Net          3027.66 ml   Filed Weights   09/15/16 2233 09/16/16 0400 09/17/16 0500  Weight: 79.4 kg (175 lb) 79.8 kg (175 lb 14.8 oz) 84.3 kg (185 lb 13.6 oz)    Telemetry    Sinus- Personally Reviewed    Physical Exam   GEN: WD/WN No acute distress.   Neck: Supple Cardiac: RRR, no murmurs, rubs, or gallops. 2+ carotid, radial and femoral pulses Respiratory: Clear to auscultation bilaterally. GI: Soft, nontender, non-distended  MS: No edema Neuro:  Nonfocal  Psych: Normal affect   Labs    Chemistry Recent Labs Lab 09/15/16 2232 09/16/16 0607 09/17/16 0328  NA 128* 135 131*  K 2.9* 3.0* 3.5  CL 92* 99* 99*  CO2 25 24 22   GLUCOSE 125* 119* 122*  BUN 54* 48* 53*  CREATININE 4.02* 3.57* 4.50*  CALCIUM  8.0* 7.7* 7.7*  GFRNONAA 16* 18* 14*  GFRAA 18* 21* 16*  ANIONGAP 11 12 10      Hematology Recent Labs Lab 09/15/16 2232 09/16/16 0607 09/17/16 0328  WBC 12.2* 10.5 8.0  RBC 3.58* 3.31* 3.07*  HGB 9.7* 9.2* 8.4*  HCT 31.6* 29.0* 27.0*  MCV 88.3 87.6 87.9  MCH 27.1 27.8 27.4  MCHC 30.7 31.7 31.1  RDW 16.2* 16.6* 16.5*  PLT 298 256 212    Cardiac Enzymes Recent Labs Lab 09/15/16 2232 09/16/16 0607 09/16/16 1024 09/16/16 1617  TROPONINI 0.27* 0.24* 0.51* 0.96*    Radiology    Dg Chest 2 View  Result Date: 09/15/2016 CLINICAL DATA:  Chest pain for 2 days.  History of hypertension. EXAM: CHEST  2 VIEW COMPARISON:  Chest radiograph December 28, 2015 FINDINGS: Cardiac silhouette is moderately enlarged and unchanged. Mediastinal silhouette is nonsuspicious. Mild bronchitic changes. Small LEFT pleural effusion. No pneumothorax. Soft tissue planes and included osseous structures are nonsuspicious. IMPRESSION: Stable cardiomegaly. Mild bronchitic changes and small LEFT pleural effusion. Electronically Signed   By: Awilda Metro  M.D.   On: 09/15/2016 23:43   Ct Angio Chest/abd/pel For Dissection W And/or Wo Contrast  Result Date: 09/16/2016 CLINICAL DATA:  Chest pain radiating to the back. EXAM: CT ANGIOGRAPHY CHEST, ABDOMEN AND PELVIS TECHNIQUE: Multidetector CT imaging through the chest, abdomen and pelvis was performed using the standard protocol during bolus administration of intravenous contrast. Multiplanar reconstructed images and MIPs were obtained and reviewed to evaluate the vascular anatomy. CONTRAST:  80 mL Isovue 370 COMPARISON:  None. FINDINGS: CTA CHEST FINDINGS Cardiovascular: Precontrast imaging of the chest shows intramural hematoma within the distal aortic arch extending to the midthoracic descending aorta. There is a dissection flap that begins distal to the left subclavian artery origin and extends inferiorly to the level of the superior mesenteric artery. There is  no extension into the arch vessels, the celiac axis or the superior mesenteric artery. The central pulmonary arteries are normal. There is atherosclerotic calcification within the coronary arteries. No pericardial effusion. The heart is enlarged. Mediastinum/Nodes: No enlarged mediastinal, hilar, or axillary lymph nodes. Thyroid gland, trachea, and esophagus demonstrate no significant findings. Lungs/Pleura: Small left pleural effusion and associated atelectasis. Musculoskeletal: No chest wall abnormality. No acute or significant osseous findings. Review of the MIP images confirms the above findings. CTA ABDOMEN AND PELVIS FINDINGS VASCULAR Aorta: As stated above, aortic dissection extends to the level of the celiac axis and superior mesenteric artery without extension into the major abdominal branches. Celiac: Patent without evidence of aneurysm, dissection, vasculitis or significant stenosis. SMA: Patent without evidence of aneurysm, dissection, vasculitis or significant stenosis. Renals: Both renal arteries are patent without evidence of aneurysm, dissection, vasculitis, fibromuscular dysplasia or significant stenosis. IMA: Patent without evidence of aneurysm, dissection, vasculitis or significant stenosis. Inflow: Patent without evidence of aneurysm, dissection, vasculitis or significant stenosis. Veins: No obvious venous abnormality within the limitations of this arterial phase study. Review of the MIP images confirms the above findings. NON-VASCULAR Hepatobiliary: No focal liver abnormality is seen. No gallstones, gallbladder wall thickening, or biliary dilatation. Pancreas: Unremarkable. No pancreatic ductal dilatation or surrounding inflammatory changes. Spleen: Normal in size without focal abnormality. Adrenals/Urinary Tract: Adrenal glands are unremarkable. Kidneys are normal, without renal calculi, focal lesion, or hydronephrosis. Bladder is unremarkable. Stomach/Bowel: Stomach is within normal limits.  Appendix appears normal. No evidence of bowel wall thickening, distention, or inflammatory changes. Mild edema/fluid adjacent to the ascending colon. No wall thickening. Lymphatic: Numerous subcentimeter retroperitoneal lymph nodes. Reproductive: Normal prostate and seminal vesicles. Other: No abdominal wall hernia or abnormality. No abdominopelvic ascites. Musculoskeletal: No acute or significant osseous findings. Review of the MIP images confirms the above findings. IMPRESSION: 1. Acute aortic dissection (Stanford type B) extending from just distal to the left subclavian artery origin to the level of the superior mesenteric artery. No extension into the branch vessels of the aorta. 2. Associated acute intramural hematoma within the distal aortic arch and proximal descending thoracic aorta. 3. Small left pleural effusion and associated atelectasis. 4. Cardiomegaly and coronary artery calcification. Critical Value/emergent results were called by telephone at the time of interpretation on 09/16/2016 at 4:35 am to Dr. Rochele Raring , who verbally acknowledged these results. Electronically Signed   By: Deatra Robinson M.D.   On: 09/16/2016 04:48     Patient Profile     56 year old male with past medical history of coronary artery disease, ischemic cardiomyopathy, prior CVA, hypertension, cocaine abuse, noncompliance admitted with hypertensive emergency, type B aortic dissection who we are asked to evaluate for dissection and hypertensive  emergency. Patient presented with complaints of 2-3 days of chest, epigastric and abdominal pain. Initial blood pressure 243/147. Patient had not been taking medications at home. Drug screen positive for cocaine. CTA reveals type B aortic dissection distal to the left subclavian.   Assessment & Plan     1 type B aortic dissection-Plan medical therapy; femoral and radial pulses palable. BP improved but mildly elevated. Discontinue clonidine given history of noncompliance and  potential for rebound hypertension if he discontinues this medication. Increase hydralazine to 50 mg by mouth 3 times a day. Increase labetalol to 300 mg twice a day. Titrate medications as needed.  2 coronary artery disease-begin aspirin 81 mg daily and Lipitor 40 mg daily at discharge.  3 history of ischemic cardiomyopathy-await echocardiogram. If LV function significantly reduced will change labetalol to coreg and add nitrates to hydralazine. He would not be a candidate for ARB or ACE inhibitor given severity of renal insufficiency.  4 cocaine abuse-previously counseled on discontinuing.  5 noncompliance-I again discussed the life threatening nature of his disease. I explained the importance of compliance with medications.  6 acute on chronic stage IV kidney disease-renal function worse. Probable contribution from contrast nephropathy. Would ask nephrology to see.  7 hypertensive emergency-blood pressure improved. Would adjust regimen as outlined above and follow blood pressure.  Signed, Olga Millers, MD  09/17/2016, 7:21 AM

## 2016-09-17 NOTE — Progress Notes (Signed)
  Echocardiogram 2D Echocardiogram has been performed.  Austin Simmons 09/17/2016, 11:47 AM

## 2016-09-17 NOTE — Progress Notes (Addendum)
  Progress Note  SUBJECTIVE:    Chest pain still present but better. Still having some back pain and abdominal pain. No pain with eating.   OBJECTIVE:   Vitals:   09/17/16 0803 09/17/16 0815  BP: (!) 166/88 (!) 168/88  Pulse: 80 80  Resp:  20  Temp:    SpO2:  96%    Intake/Output Summary (Last 24 hours) at 09/17/16 0849 Last data filed at 09/17/16 0800  Gross per 24 hour  Intake          3279.33 ml  Output              400 ml  Net          2879.33 ml   Abdomen is soft without distension. Mild tenderness to RUQ. Feet are warm and well perfused. No numbness. Motor and sensory function intact to feet.   ASSESSMENT/PLAN:   56 y.o. male with acute type B aortic dissection  Continue aggressive blood pressure control. Creatinine is worse this am.  Does not have any evidence of mesenteric or limb ischemia.  Will continue to follow as patient may need future TEVAR if dissection extends.   Austin Simmons 09/17/2016 8:49 AM -- LABS:   CBC    Component Value Date/Time   WBC 8.0 09/17/2016 0328   HGB 8.4 (L) 09/17/2016 0328   HCT 27.0 (L) 09/17/2016 0328   PLT 212 09/17/2016 0328    BMET    Component Value Date/Time   NA 131 (L) 09/17/2016 0328   K 3.5 09/17/2016 0328   CL 99 (L) 09/17/2016 0328   CO2 22 09/17/2016 0328   GLUCOSE 122 (H) 09/17/2016 0328   BUN 53 (H) 09/17/2016 0328   CREATININE 4.50 (H) 09/17/2016 0328   CREATININE 1.25 10/25/2013 1512   CALCIUM 7.7 (L) 09/17/2016 0328   GFRNONAA 14 (L) 09/17/2016 0328   GFRNONAA 66 10/25/2013 1512   GFRAA 16 (L) 09/17/2016 0328   GFRAA 77 10/25/2013 1512    COAG Lab Results  Component Value Date   INR 0.96 06/14/2016   INR 0.89 04/07/2014   INR 0.88 06/17/2013   No results found for: PTT  ANTIBIOTICS:   Anti-infectives    None       Maris Berger, PA-C [facility] Office: [phone] Pager: [phone] 09/17/2016 8:49 AM   Addendum  I have independently  interviewed and examined the patient, and I agree with the physician assistant's findings.  When I talked to this patient he had NO pain.  On exam, he demonstrates no evidence of visceral or extremity malperfusion.    - strict BP (120-130/80-90) essential to avoid extension of dissection. - No intervention necessary at this point.   - Will continue to follow.   Leonides Sake, MD, FACS [facility] [place] Office: [phone] Pager: [phone]  09/17/2016, 10:45 AM

## 2016-09-17 NOTE — Progress Notes (Signed)
      [address]       [place] [address]             [phone]        CARDIOTHORACIC SURGERY PROGRESS NOTE  Subjective: Still reports epigastric chest and abdominal pain.  Looks very comfortable.  Sitting up in chair  Objective: Vital signs: BP Readings from Last 1 Encounters:  09/17/16 (!) 168/88   Pulse Readings from Last 1 Encounters:  09/17/16 80   Resp Readings from Last 1 Encounters:  09/17/16 20   Temp Readings from Last 1 Encounters:  09/17/16 98.7 F (37.1 C) (Oral)    Hemodynamics:    Physical Exam:  Rhythm:   sinus  Breath sounds: clear  Heart sounds:  RRR  Incisions:  n/a  Abdomen:  Soft, non-distended, non-tender  Extremities:  Warm, well-perfused   Intake/Output from previous day: 08/27 0701 - 08/28 0700 In: 3452.7 [P.O.:1320; I.V.:1732.7; IV Piggyback:400] Out: 425 [Urine:425] Intake/Output this shift: Total I/O In: 120 [P.O.:120] Out: 50 [Urine:50]  Lab Results:  CBC: Recent Labs  09/16/16 0607 09/17/16 0328  WBC 10.5 8.0  HGB 9.2* 8.4*  HCT 29.0* 27.0*  PLT 256 212    BMET:  Recent Labs  09/16/16 0607 09/17/16 0328  NA 135 131*  K 3.0* 3.5  CL 99* 99*  CO2 24 22  GLUCOSE 119* 122*  BUN 48* 53*  CREATININE 3.57* 4.50*  CALCIUM 7.7* 7.7*     PT/INR:  No results for input(s): LABPROT, INR in the last 72 hours.  CBG (last 3)  No results for input(s): GLUCAP in the last 72 hours.  ABG    Component Value Date/Time   TCO2 23 06/14/2016 1205    CXR: n/a  Assessment/Plan:  Patient appears clinically stable although his blood pressure is now fairly high with mean arterial pressure greater than 110 mmHg. Both labetalol and nicardipine drips have been stopped. Physical exam is benign with no significant abdominal tenderness.  Urine output adequate although difficult to quantify, Foley has been removed. BUN and creatinine are rising.  I would favor more aggressive treatment of hypertension,  potentially including resuming intravenous drips until blood pressure is brought under better control with oral agents.  We will continue to follow periodically but please call if specific problems or questions arise.   I spent in excess of 15 minutes during the conduct of this hospital encounter and >50% of this time involved direct face-to-face encounter with the patient for counseling and/or coordination of their care.  Purcell Nails, MD 09/17/2016 8:34 AM

## 2016-09-17 NOTE — Progress Notes (Signed)
Notified lab x2 at 1700 and 1830 of pending lab draw ordered at 1400 today.
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
## 2016-09-18 ENCOUNTER — Other Ambulatory Visit: Payer: Self-pay

## 2016-09-18 DIAGNOSIS — N179 Acute kidney failure, unspecified: Secondary | ICD-10-CM

## 2016-09-18 LAB — RENAL FUNCTION PANEL
ANION GAP: 11 (ref 5–15)
Albumin: 2.6 g/dL — ABNORMAL LOW (ref 3.5–5.0)
BUN: 51 mg/dL — ABNORMAL HIGH (ref 6–20)
CHLORIDE: 101 mmol/L (ref 101–111)
CO2: 21 mmol/L — AB (ref 22–32)
Calcium: 8 mg/dL — ABNORMAL LOW (ref 8.9–10.3)
Creatinine, Ser: 4.57 mg/dL — ABNORMAL HIGH (ref 0.61–1.24)
GFR calc Af Amer: 16 mL/min — ABNORMAL LOW (ref >60)
GFR calc non Af Amer: 13 mL/min — ABNORMAL LOW (ref >60)
GLUCOSE: 103 mg/dL — AB (ref 65–99)
POTASSIUM: 4 mmol/L (ref 3.5–5.1)
Phosphorus: 5.1 mg/dL — ABNORMAL HIGH (ref 2.5–4.6)
Sodium: 133 mmol/L — ABNORMAL LOW (ref 135–145)

## 2016-09-18 MED ORDER — HYDRALAZINE HCL 50 MG PO TABS
75.0000 mg | ORAL_TABLET | Freq: Three times a day (TID) | ORAL | Status: DC
  Administered 2016-09-18 (×3): 75 mg via ORAL
  Filled 2016-09-18 (×3): qty 1

## 2016-09-18 MED ORDER — ACETAMINOPHEN-CODEINE #3 300-30 MG PO TABS
1.0000 | ORAL_TABLET | ORAL | Status: DC | PRN
  Administered 2016-09-18 – 2016-09-19 (×4): 1 via ORAL
  Filled 2016-09-18 (×4): qty 1

## 2016-09-18 MED ORDER — ISOSORBIDE MONONITRATE ER 30 MG PO TB24
30.0000 mg | ORAL_TABLET | Freq: Every day | ORAL | Status: DC
  Administered 2016-09-18: 30 mg via ORAL
  Filled 2016-09-18: qty 1

## 2016-09-18 MED ORDER — NICARDIPINE HCL IN NACL 40-0.83 MG/200ML-% IV SOLN
3.0000 mg/h | INTRAVENOUS | Status: DC
  Administered 2016-09-18: 10 mg/h via INTRAVENOUS
  Administered 2016-09-19: 3 mg/h via INTRAVENOUS
  Filled 2016-09-18 (×2): qty 200

## 2016-09-18 MED ORDER — SODIUM CHLORIDE 0.9 % IV SOLN
510.0000 mg | INTRAVENOUS | Status: AC
  Administered 2016-09-18 – 2016-09-21 (×2): 510 mg via INTRAVENOUS
  Filled 2016-09-18 (×3): qty 17

## 2016-09-18 NOTE — Progress Notes (Signed)
Patient ID: Austin Simmons, male   DOB: 03-06-1960, 56 y.o.   MRN: [id number] [facility] Progress Note   Assessment/ Plan:   1. Acute kidney injury on chronic kidney disease stage III: This appears to be bi-factorial from recent contrast exposure as well as hemodynamically mediated renal injury with the need for acute blood pressure lowering. Nonoliguric overnight and awaiting labs from this morning to assess renal function/electrolytes-if labs allow, will start on furosemide to augment urine output/improve blood pressure control/improve volume status. 2. Type B acute aortic dissection:  continue aggressive blood pressure control with plans noted for transitioning to oral antihypertensive agents. No plans for intervention at this time for vascular surgery. 3. Hypertension:  compounded by substance abuse/cocaine use and poor adherence to antihypertensive therapy. 4. Anemia: Iron panel from yesterday shows significant iron deficiency with an iron saturation of 4% and ferritin of 50, will give intravenous iron.  Subjective:   Frustrated by back pain, insomnia and inability to eat/talk properly without his dentures.    Objective:   BP 140/69   Pulse 80   Temp 98 F (36.7 C) (Oral)   Resp (!) 22   Ht 5\' 5"  (1.651 m)   Wt 86.7 kg (191 lb 3.2 oz)   SpO2 96%   BMI 31.82 kg/m   Intake/Output Summary (Last 24 hours) at 09/18/16 0810 Last data filed at 09/18/16 0700  Gross per 24 hour  Intake          2920.84 ml  Output             1100 ml  Net          1820.84 ml   Weight change: 2.428 kg (5 lb 5.6 oz)  Physical Exam: [id number] sitting up in recliner CVS: Pulse regular rhythm, normal rate, S1 and S2 Resp: Clear to auscultation bilaterally, no rales Abd: Soft, obese, nontender Ext: 1-2 plus left lower extremity edema, 1+ right lower extremity edema  Imaging: US Renal  Result Date: 09/17/2016 CLINICAL DATA:  Acute on chronic renal failure, hypertension EXAM: RENAL  / URINARY TRACT ULTRASOUND COMPLETE COMPARISON:  CT abdomen pelvis of 10/28/2012 FINDINGS: Right Kidney: Length: 11.0 cm. No hydronephrosis is seen. The echogenicity of the right renal parenchyma is increased consistent with chronic renal medical disease. Left Kidney: Length: 10.5 cm. No hydronephrosis is seen. The echogenicity of the left renal parenchymal also is increased. Bladder: The urinary bladder is unremarkable. IMPRESSION: 1. Echogenic renal parenchyma bilaterally consistent with chronic renal medical disease. 2. No hydronephrosis. Electronically Signed   By: Dwyane Dee M.D.   On: 09/17/2016 15:48    Labs: BMET  Recent Labs Lab 09/15/16 2232 09/16/16 0607 09/17/16 0328  NA 128* 135 131*  K 2.9* 3.0* 3.5  CL 92* 99* 99*  CO2 25 24 22   GLUCOSE 125* 119* 122*  BUN 54* 48* 53*  CREATININE 4.02* 3.57* 4.50*  CALCIUM 8.0* 7.7* 7.7*  PHOS  --  3.2  --    CBC  Recent Labs Lab 09/15/16 2232 09/16/16 0607 09/17/16 0328  WBC 12.2* 10.5 8.0  HGB 9.7* 9.2* 8.4*  HCT 31.6* 29.0* 27.0*  MCV 88.3 87.6 87.9  PLT 298 256 212   Medications:    . Chlorhexidine Gluconate Cloth  6 each Topical Q0600  . hydrALAZINE  75 mg Oral TID  . isosorbide mononitrate  30 mg Oral Daily  . labetalol  300 mg Oral BID  . levothyroxine  50 mcg Oral QAC breakfast  .  mupirocin ointment  1 application Nasal BID  . pantoprazole  40 mg Oral Daily   Zetta Bills, MD 09/18/2016, 8:10 AM

## 2016-09-18 NOTE — Evaluation (Signed)
Occupational Therapy Evaluation Patient Details Name: Austin Simmons MRN: [id number] DOB: 10-12-1960 Today's Date: 09/18/2016    History of Present Illness 56 year old male with PMH of CAD s/p MI 2014, ischemic cardiomyopathy, CVA unknown deficits, CKD, medical noncompliance and cocaine use presented to the emergency department on 8/26 hypertensive at 243/147 with severe chest pain, 9 out of 10, located mid chest near the xiphoid radiating into his abdomen is continuous is minimally better with opioids. The pain has been going on for 2-3 days he hasn't had pain like this before. He denies use of cocaine. He says he has not taken antihypertensive medications in quite some time.  CTA shows showed acute type B aortic dissection.   Clinical Impression   Pt admitted with above. He demonstrates the below listed deficits and will benefit from continued OT to maximize safety and independence with BADLs.  Pt presents to OT with generalized weakness, pain, decreased activity tolerance, and poor judgement and safety awareness.  He requires mod A for LB ADLs due to pain, and min guard assist for functional mobility.  He reports he lives with family who can assist him as needed.  He provided inconsistent info re: his PLOF - initially states he was fully independent, then later states he needed intermittent assist.  Will follow acutely.       Follow Up Recommendations  No OT follow up;Supervision - Intermittent    Equipment Recommendations  None recommended by OT    Recommendations for Other Services       Precautions / Restrictions Precautions Precautions: Fall      Mobility Bed Mobility Overal bed mobility: Needs Assistance Bed Mobility: Sit to Supine       Sit to supine: Supervision      Transfers Overall transfer level: Needs assistance Equipment used: None Transfers: Sit to/from Stand;Stand Pivot Transfers Sit to Stand: Min guard Stand pivot transfers: Min guard       General  transfer comment: Pt impulsive     Balance Overall balance assessment: Needs assistance Sitting-balance support: Feet supported Sitting balance-Leahy Scale: Good     Standing balance support: During functional activity;No upper extremity supported Standing balance-Leahy Scale: Good                             ADL either performed or assessed with clinical judgement   ADL Overall ADL's : Needs assistance/impaired Eating/Feeding: Independent   Grooming: Wash/dry hands;Wash/dry face;Oral care;Brushing hair;Min guard;Standing   Upper Body Bathing: Set up;Sitting   Lower Body Bathing: Moderate assistance;Sit to/from stand Lower Body Bathing Details (indicate cue type and reason): due to pain  Upper Body Dressing : Set up;Sitting   Lower Body Dressing: Maximal assistance;Sit to/from stand Lower Body Dressing Details (indicate cue type and reason): due to pain  Toilet Transfer: Min guard;Ambulation;Grab bars;Comfort height toilet   Toileting- Clothing Manipulation and Hygiene: Min guard;Sit to/from stand       Functional mobility during ADLs: Min guard General ADL Comments: Pt very impulsive.  Unable to access feet due to pain.  Only agreeable to ambulating to BR      Vision Baseline Vision/History: Wears glasses Wears Glasses: At all times Patient Visual Report: No change from baseline       Perception     Praxis      Pertinent Vitals/Pain Pain Assessment: 0-10 Pain Score: 9  Pain Location: chest and abdomen  Pain Descriptors / Indicators: Grimacing Pain Intervention(s): Monitored during  session;Patient requesting pain meds-RN notified     Hand Dominance Right   Extremity/Trunk Assessment Upper Extremity Assessment Upper Extremity Assessment: LUE deficits/detail;Generalized weakness LUE Deficits / Details: Lt shoulder flexion limited to ~70* that pt attributes to weakness, but he is unable to explain what caused the weakness, only that it is long  standing and hasn't gotten worse    Lower Extremity Assessment Lower Extremity Assessment: Defer to PT evaluation       Communication Communication Communication: Expressive difficulties (very difficult to understand )   Cognition Arousal/Alertness: Awake/alert Behavior During Therapy: Anxious Overall Cognitive Status: No family/caregiver present to determine baseline cognitive functioning                                 General Comments: Pt very impulsive.  Difficult to assess cognition fully due to pt's fixation on pain and difficult to redirect.  He is also very difficult to understand    General Comments  VSS     Exercises     Shoulder Instructions      Home Living Family/patient expects to be discharged to:: Private residence Living Arrangements: Spouse/significant other;Children Available Help at Discharge: Friend(s);Available 24 hours/day Type of Home: Mobile home Home Access: Stairs to enter Entrance Stairs-Number of Steps: 6 Entrance Stairs-Rails: Right;Left;Can reach both Home Layout: One level     Bathroom Shower/Tub: Producer, television/film/video: Standard     Home Equipment: Environmental consultant - 2 wheels;Shower seat;Bedside commode;Hand held shower head;Grab bars - tub/shower;Cane - quad;Adaptive equipment Adaptive Equipment: Sock aid Additional Comments: Pt reports he lives with spouse, 2 children and a nurse who rents space from him       Prior Functioning/Environment Level of Independence: Independent;Needs assistance  Gait / Transfers Assistance Needed: Pt reports he ambulates without assistive device.  denies falls ADL's / Homemaking Assistance Needed: Pt reports he is independent with all ADLs, cooking, cleaning, and yard work, but later states he has a Engineer, civil (consulting), who lives with him, that helps him don his socks when he needs it (~2x/wk)            OT Problem List: Decreased strength;Decreased activity tolerance;Decreased range of  motion;Impaired balance (sitting and/or standing);Decreased cognition;Decreased safety awareness;Decreased knowledge of use of DME or AE;Cardiopulmonary status limiting activity;Pain      OT Treatment/Interventions: Self-care/ADL training;DME and/or AE instruction;Therapeutic activities;Cognitive remediation/compensation;Patient/family education;Balance training    OT Goals(Current goals can be found in the care plan section) Acute Rehab OT Goals Patient Stated Goal: to go home OT Goal Formulation: With patient Time For Goal Achievement: 10/02/16 Potential to Achieve Goals: Good ADL Goals Pt Will Perform Grooming: with modified independence;standing Pt Will Perform Upper Body Bathing: with modified independence;sitting;standing Pt Will Perform Lower Body Bathing: with modified independence;sit to/from stand Pt Will Perform Upper Body Dressing: with modified independence;sitting Pt Will Perform Lower Body Dressing: with modified independence;sit to/from stand Pt Will Transfer to Toilet: with modified independence;ambulating;regular height toilet;grab bars Pt Will Perform Toileting - Clothing Manipulation and hygiene: with modified independence;sit to/from stand Pt Will Perform Tub/Shower Transfer: Shower transfer;with modified independence;ambulating;shower seat  OT Frequency: Min 2X/week   Barriers to D/C:            Co-evaluation              AM-PAC PT "6 Clicks" Daily Activity     Outcome Measure Help from another person eating meals?: None Help from another person  taking care of personal grooming?: A Little Help from another person toileting, which includes using toliet, bedpan, or urinal?: A Little Help from another person bathing (including washing, rinsing, drying)?: A Lot Help from another person to put on and taking off regular upper body clothing?: A Little Help from another person to put on and taking off regular lower body clothing?: A Lot 6 Click Score: 17   End  of Session Equipment Utilized During Treatment: Oxygen Nurse Communication: Mobility status;Patient requests pain meds  Activity Tolerance: Patient limited by pain Patient left: in bed;with call bell/phone within reach;with bed alarm set  OT Visit Diagnosis: Unsteadiness on feet (R26.81);Pain Pain - part of body:  (abdomen )                Time: [id number] OT Time Calculation (min): 22 min Charges:  OT General Charges $OT Visit: 1 Visit OT Evaluation $OT Eval Moderate Complexity: 1 Mod G-Codes:     Reynolds AmericanWendi Jackolyn Geron, OTR/L [phone]   Virgina OrganConarpe, Myles Mallicoat M 09/18/2016, 5:00 PM

## 2016-09-18 NOTE — Progress Notes (Signed)
      [address]       [name] [id number]             [phone]        CARDIOTHORACIC SURGERY PROGRESS NOTE  Subjective: Still c/o pain but looks good  Objective: Vital signs: BP Readings from Last 1 Encounters:  09/18/16 (!) 131/59   Pulse Readings from Last 1 Encounters:  09/18/16 81   Resp Readings from Last 1 Encounters:  09/18/16 (!) 22   Temp Readings from Last 1 Encounters:  09/18/16 (!) 97.5 F (36.4 C) (Oral)    Hemodynamics:    Physical Exam:  Rhythm:   sinus  Breath sounds: clear  Heart sounds:  RRR w/out murmur  Incisions:  n/a  Abdomen:  Soft, non tender  Extremities:  warm   Intake/Output from previous day: 08/28 0701 - 08/29 0700 In: 3040.8 [P.O.:840; I.V.:2200.8] Out: 1250 [Urine:1250] Intake/Output this shift: Total I/O In: 1098.3 [P.O.:840; I.V.:258.3] Out: 200 [Urine:200]  Lab Results:  CBC: Recent Labs  09/16/16 0607 09/17/16 0328  WBC 10.5 8.0  HGB 9.2* 8.4*  HCT 29.0* 27.0*  PLT 256 212    BMET:  Recent Labs  09/17/16 0328 09/18/16 0709  NA 131* 133*  K 3.5 4.0  CL 99* 101  CO2 22 21*  GLUCOSE 122* 103*  BUN 53* 51*  CREATININE 4.50* 4.57*  CALCIUM 7.7* 8.0*     PT/INR:  No results for input(s): LABPROT, INR in the last 72 hours.  CBG (last 3)  No results for input(s): GLUCAP in the last 72 hours.  ABG    Component Value Date/Time   TCO2 23 06/14/2016 1205    CXR: n/a  Assessment/Plan:  Clinically stable BP under better control Exam benign  Purcell Nailslarence H Owen, MD 09/18/2016 2:25 PM

## 2016-09-18 NOTE — Progress Notes (Addendum)
PULMONARY / CRITICAL CARE MEDICINE   Name: Austin Simmons MRN: [id number] DOB: [birth year]    ADMISSION DATE:  09/15/2016  CHIEF COMPLAINT:  Chest pain 2-3 days  Brief patient summary:   56 year old male with PMH of CAD s/p MI 2014, ischemic cardiomyopathy, CVA unknown deficits, CKD, medical noncompliance and cocaine use presented to the emergency department on 8/26 hypertensive at 243/147 with severe chest pain, 9 out of 10, located mid chest near the xiphoid radiating into his abdomen is continuous is minimally better with opioids. The pain has been going on for 2-3 days he hasn't had pain like this before. He denies use of cocaine. He says he has not taken antihypertensive medications in quite some time.  CTA shows showed acute type B aortic dissection.  SUBJECTIVE:  Remains on cardene at 15mg /hr with SBP in 140's. Pt complains of pain in abdomen and back, some chest too.  However, this is completely reproducible and when he has palpation to chest wall, he moans "oohhh".   VITAL SIGNS: BP 140/69   Pulse 80   Temp 98 F (36.7 C) (Oral)   Resp (!) 22   Ht 5\' 5"  (1.651 m)   Wt 86.7 kg (191 lb 3.2 oz)   SpO2 96%   BMI 31.82 kg/m   HEMODYNAMICS:   VENTILATOR SETTINGS:   INTAKE / OUTPUT: I/O last 3 completed shifts: In: 3655.8 [P.O.:1080; I.V.:2575.8] Out: 1500 [Urine:1500]  PHYSICAL EXAMINATION: General:  Adult male sitting in recliner, in NAD HEENT: [place] / AT.  MMM Neuro: A&O x 3, no deficits CV: RRR, No M/R/G.  Chest wall pain that is reproducible with palpation to anterior chest wall PULM: CTAB GI: protuberant, soft, bs active, NT Extremities: warm/dry, +2 distal pulses, trace BLE edema  Skin: no rashes or lesions  LABS:  BMET  Recent Labs Lab 09/16/16 0607 09/17/16 0328 09/18/16 0709  NA 135 131* 133*  K 3.0* 3.5 4.0  CL 99* 99* 101  CO2 24 22 21*  BUN 48* 53* 51*  CREATININE 3.57* 4.50* 4.57*  GLUCOSE 119* 122* 103*    Electrolytes  Recent  Labs Lab 09/16/16 0607 09/17/16 0328 09/17/16 1023 09/18/16 0709  CALCIUM 7.7* 7.7*  --  8.0*  MG 1.7  --  1.9  --   PHOS 3.2  --   --  5.1*    CBC  Recent Labs Lab 09/15/16 2232 09/16/16 0607 09/17/16 0328  WBC 12.2* 10.5 8.0  HGB 9.7* 9.2* 8.4*  HCT 31.6* 29.0* 27.0*  PLT 298 256 212    Coag's No results for input(s): APTT, INR in the last 168 hours.  Sepsis Markers No results for input(s): LATICACIDVEN, PROCALCITON, O2SATVEN in the last 168 hours.  ABG No results for input(s): PHART, PCO2ART, PO2ART in the last 168 hours.  Liver Enzymes  Recent Labs Lab 09/18/16 0709  ALBUMIN 2.6*    Cardiac Enzymes  Recent Labs Lab 09/16/16 0607 09/16/16 1024 09/16/16 1617  TROPONINI 0.24* 0.51* 0.96*    Glucose No results for input(s): GLUCAP in the last 168 hours.  Imaging US Renal  Result Date: 09/17/2016 CLINICAL DATA:  Acute on chronic renal failure, hypertension EXAM: RENAL / URINARY TRACT ULTRASOUND COMPLETE COMPARISON:  CT abdomen pelvis of 10/28/2012 FINDINGS: Right Kidney: Length: 11.0 cm. No hydronephrosis is seen. The echogenicity of the right renal parenchyma is increased consistent with chronic renal medical disease. Left Kidney: Length: 10.5 cm. No hydronephrosis is seen. The echogenicity of the left renal parenchymal also is  increased. Bladder: The urinary bladder is unremarkable. IMPRESSION: 1. Echogenic renal parenchyma bilaterally consistent with chronic renal medical disease. 2. No hydronephrosis. Electronically Signed   By: Dwyane DeePaul  Barry M.D.   On: 09/17/2016 15:48   STUDIES:  CTA 8/27>> acute aortic dissection type B extending from just distal to the left subclavian artery origin to the level of superior mesenteric artery; no extension into the branch vessels of the aorta.  Associated acute intramural hematoma w/in the distal aortic arch and proximal descending thoracic aorta; small left pleural effusion and associated atelectasis.  Cardiomegaly and  coronary artery calcification TTE 8/28 > EF 40-45%, G1DD. Renal US 8/28 > chronic renal disease, no hydro.  CULTURES: MRSA PCR 8/27 >> positive  ANTIBIOTICS: None  SIGNIFICANT EVENTS: Admitted to the ICU 8/27  LINES/TUBES: Left radial A-line 8/26 >>8/27  DISCUSSION: 56 year old man presented with severe chest pain and abdominal pain with past medical history of multivessel coronary artery disease, untreated hypertension, cocaine use admitted to the ICU with acute type B aortic dissection and hypertensive emergency  ASSESSMENT / PLAN:  PULMONARY A: No acute issues P:   No interventions required  CARDIOVASCULAR A:  Hypertensive emergency secondary to medical noncompliance and cocaine abuse Type B aortic dissection- r//t to above Elevated troponins - due to above Hx CAD, MI w/PCI, uncontrolled HTN, ICM w/EF 40-45% 5/15, HLD P:  TCTS, Vascular, and Cards following, appreciate recs Surgery not indicated at this time with no mesenteric or limb ischemia  Continue with aggressive BP control - goal SBP < 140 Continue labetalol 300 mg BID, hydralazine 75mg  TID, Imdur 30mg  daily Wean cardene gtt to off D/c clonidine per cards Continue PRN hydralazine and labetalol Hold on ACE or ARB given renal fxn If CP or abd pain worsens, consider repeat CT (though suspect this is musculoskeletal as is completely reproducible) Consider lasix  RENAL A:   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- baseline sCr 2.5 Risk of acute contrast nephropathy s/p CTA P:   Nephrology following, appreciate recs Strict I/O, daily wts Replace electrolytes as indicated  BMP in AM  GASTROINTESTINAL A:   Mild consitpation P:   Renal diet- tolerating dulcolax prn  PPI for SUP  HEMATOLOGIC A:   DVT prophylaxis P:    SCDs  INFECTIOUS A:   No active issue MRSA PCR positive P:   Monitor  Contact precautions per protocol and continue nasal bactroban  ENDOCRINE A:   Hx Hypothyroid P:   Monitor  glucose on BMP Continue levothyroxine 50 mcg daily  NEUROLOGIC A:   Pain and anxiety Hx CVA P:   D/c fentanyl Tylenol #3 ordered q4hrs PRN Continue PRN ativan  PT following   FAMILY  - Updates: No family at bedside.   - Inter-disciplinary family meet or Palliative Care meeting due by:  09/24/26  CCT: 30 min.   Rutherford Guysahul Isamar Nazir, [place] - [facility] Pager: [phone]  or [phone] 09/18/2016, 8:58 AM

## 2016-09-18 NOTE — Progress Notes (Signed)
Progress Note  Patient Name: Austin Simmons Date of Encounter: 09/18/2016  Primary Cardiologist: Dr Anne Fu  Subjective   No change with continued mild dyspnea, CP and abdominal pain  Inpatient Medications    Scheduled Meds: . Chlorhexidine Gluconate Cloth  6 each Topical Q0600  . hydrALAZINE  50 mg Oral TID  . labetalol  300 mg Oral BID  . levothyroxine  50 mcg Oral QAC breakfast  . mupirocin ointment  1 application Nasal BID  . pantoprazole  40 mg Oral Daily   Continuous Infusions: . sodium chloride    . niCARDipine 10 mg/hr (09/18/16 0700)   PRN Meds: sodium chloride, bisacodyl, fentaNYL (SUBLIMAZE) injection, hydrALAZINE, labetalol, LORazepam   Vital Signs    Vitals:   09/18/16 0500 09/18/16 0600 09/18/16 0626 09/18/16 0700  BP: (!) 124/54 138/71  140/69  Pulse: 78 81 81 80  Resp: 18 19 16  (!) 22  Temp:      TempSrc:      SpO2: 96% 97% 100% 96%  Weight:      Height:        Intake/Output Summary (Last 24 hours) at 09/18/16 0726 Last data filed at 09/18/16 0700  Gross per 24 hour  Intake          3040.84 ml  Output             1250 ml  Net          1790.84 ml   Filed Weights   09/16/16 0400 09/17/16 0500 09/18/16 0338  Weight: 79.8 kg (175 lb 14.8 oz) 84.3 kg (185 lb 13.6 oz) 86.7 kg (191 lb 3.2 oz)    Telemetry    Sinus- Personally Reviewed    Physical Exam   GEN: WD/WN NAD Neck: + JVD Cardiac: RRR; 2+ carotid, radial and femoral pulses Respiratory: Clear to auscultation bilaterally. No wheeze GI: Soft, mildly tender and distended MS: No edema; warm Neuro:  Grossly intact    Labs    Chemistry  Recent Labs Lab 09/15/16 2232 09/16/16 0607 09/17/16 0328  NA 128* 135 131*  K 2.9* 3.0* 3.5  CL 92* 99* 99*  CO2 25 24 22   GLUCOSE 125* 119* 122*  BUN 54* 48* 53*  CREATININE 4.02* 3.57* 4.50*  CALCIUM 8.0* 7.7* 7.7*  GFRNONAA 16* 18* 14*  GFRAA 18* 21* 16*  ANIONGAP 11 12 10      Hematology  Recent Labs Lab 09/15/16 2232  09/16/16 0607 09/17/16 0328  WBC 12.2* 10.5 8.0  RBC 3.58* 3.31* 3.07*  HGB 9.7* 9.2* 8.4*  HCT 31.6* 29.0* 27.0*  MCV 88.3 87.6 87.9  MCH 27.1 27.8 27.4  MCHC 30.7 31.7 31.1  RDW 16.2* 16.6* 16.5*  PLT 298 256 212    Cardiac Enzymes  Recent Labs Lab 09/15/16 2232 09/16/16 0607 09/16/16 1024 09/16/16 1617  TROPONINI 0.27* 0.24* 0.51* 0.96*    Radiology    US Renal  Result Date: 09/17/2016 CLINICAL DATA:  Acute on chronic renal failure, hypertension EXAM: RENAL / URINARY TRACT ULTRASOUND COMPLETE COMPARISON:  CT abdomen pelvis of 10/28/2012 FINDINGS: Right Kidney: Length: 11.0 cm. No hydronephrosis is seen. The echogenicity of the right renal parenchyma is increased consistent with chronic renal medical disease. Left Kidney: Length: 10.5 cm. No hydronephrosis is seen. The echogenicity of the left renal parenchymal also is increased. Bladder: The urinary bladder is unremarkable. IMPRESSION: 1. Echogenic renal parenchyma bilaterally consistent with chronic renal medical disease. 2. No hydronephrosis. Electronically Signed   By: Renae Fickle  Gery Pray M.D.   On: 09/17/2016 15:48     Patient Profile     56 year old male with past medical history of coronary artery disease, ischemic cardiomyopathy, prior CVA, hypertension, cocaine abuse, noncompliance admitted with hypertensive emergency, type B aortic dissection who we are asked to evaluate for dissection and hypertensive emergency. Patient presented with complaints of 2-3 days of chest, epigastric and abdominal pain. Initial blood pressure 243/147. Patient had not been taking medications at home. Drug screen positive for cocaine. CTA reveals type B aortic dissection distal to the left subclavian.   Assessment & Plan     1 type B aortic dissection-Plan medical therapy; femoral and radial pulses palable; vascular surgery following. BP improved but he remains on IV cardene. Increase hydralazine to 75 mg by mouth 3 times a day. Add imdur 30 mg  daily (EF 40-45). Continue labetalol 300 mg twice a day. Wean cardene to off.  2 coronary artery disease-begin aspirin 81 mg daily and Lipitor 40 mg daily at discharge.  3 history of ischemic cardiomyopathy-EF 40-45 by echo. Continue labetalol and hydralazine; add nitrates. He would not be a candidate for ARB or ACE inhibitor given severity of renal insufficiency.  4 cocaine abuse-previously counseled on discontinuing.  5 noncompliance-I explained the importance of compliance with medications.  6 acute on chronic stage IV kidney disease-bmet pending this AM; nephrology following.  7 hypertensive emergency-blood pressure improved. Would adjust regimen as outlined above and follow blood pressure.  Signed, Olga Millers, MD  09/18/2016, 7:26 AM

## 2016-09-18 NOTE — Progress Notes (Addendum)
   Daily Progress Note   Assessment/Planning:   Acute Type B aortic dissection without mesenteric or extremity malperfusion, CIN   Pt's sx are BACK, i.e. Spine, related at this point and not his aorta  BP mgmt titration in progress: back on dilt drip  At this point, no planning on intervention  Pt would like his dentures to aid his talking   Subjective     Sleeping in chair as the bed hurts his back   Objective   Vitals:   09/18/16 0500 09/18/16 0600 09/18/16 0626 09/18/16 0700  BP: (!) 124/54 138/71  140/69  Pulse: 78 81 81 80  Resp: 18 19 16  (!) 22  Temp:      TempSrc:      SpO2: 96% 97% 100% 96%  Weight:      Height:         Intake/Output Summary (Last 24 hours) at 09/18/16 0742 Last data filed at 09/18/16 0700  Gross per 24 hour  Intake          3040.84 ml  Output             1250 ml  Net          1790.84 ml    GI  No TTP, -G/R  VASC Palpable pulses in arms, faintly palpable B DP  NEURO Motor sym 5/5 throughout, speech garbled reportedly due to lack of dentures    Laboratory   CBC CBC Latest Ref Rng & Units 09/17/2016 09/16/2016 09/15/2016  WBC 4.0 - 10.5 K/uL 8.0 10.5 12.2(H)  Hemoglobin 13.0 - 17.0 g/dL 1.6(X8.4(L) 0.9(U9.2(L) 0.4(V9.7(L)  Hematocrit 39.0 - 52.0 % 27.0(L) 29.0(L) 31.6(L)  Platelets 150 - 400 K/uL 212 256 298    BMET    Component Value Date/Time   NA 131 (L) 09/17/2016 0328   K 3.5 09/17/2016 0328   CL 99 (L) 09/17/2016 0328   CO2 22 09/17/2016 0328   GLUCOSE 122 (H) 09/17/2016 0328   BUN 53 (H) 09/17/2016 0328   CREATININE 4.50 (H) 09/17/2016 0328   CREATININE 1.25 10/25/2013 1512   CALCIUM 7.7 (L) 09/17/2016 0328   GFRNONAA 14 (L) 09/17/2016 0328   GFRNONAA 66 10/25/2013 1512   GFRAA 16 (L) 09/17/2016 0328   GFRAA 77 10/25/2013 1512     Leonides SakeBrian Deaunte Dente, MD, FACS [facility] [place] Office: [phone] Pager: [phone]  09/18/2016, 7:42 AM

## 2016-09-19 LAB — GLUCOSE, CAPILLARY
GLUCOSE-CAPILLARY: 169 mg/dL — AB (ref 65–99)
Glucose-Capillary: 123 mg/dL — ABNORMAL HIGH (ref 65–99)

## 2016-09-19 LAB — CBC
HEMATOCRIT: 28.9 % — AB (ref 39.0–52.0)
Hemoglobin: 8.9 g/dL — ABNORMAL LOW (ref 13.0–17.0)
MCH: 27.1 pg (ref 26.0–34.0)
MCHC: 30.8 g/dL (ref 30.0–36.0)
MCV: 87.8 fL (ref 78.0–100.0)
PLATELETS: 267 10*3/uL (ref 150–400)
RBC: 3.29 MIL/uL — ABNORMAL LOW (ref 4.22–5.81)
RDW: 16.6 % — AB (ref 11.5–15.5)
WBC: 8.5 10*3/uL (ref 4.0–10.5)

## 2016-09-19 LAB — RENAL FUNCTION PANEL
ALBUMIN: 2.6 g/dL — AB (ref 3.5–5.0)
Anion gap: 11 (ref 5–15)
BUN: 48 mg/dL — AB (ref 6–20)
CO2: 21 mmol/L — ABNORMAL LOW (ref 22–32)
Calcium: 8.3 mg/dL — ABNORMAL LOW (ref 8.9–10.3)
Chloride: 103 mmol/L (ref 101–111)
Creatinine, Ser: 4.28 mg/dL — ABNORMAL HIGH (ref 0.61–1.24)
GFR, EST AFRICAN AMERICAN: 17 mL/min — AB (ref >60)
GFR, EST NON AFRICAN AMERICAN: 14 mL/min — AB (ref >60)
Glucose, Bld: 85 mg/dL (ref 65–99)
PHOSPHORUS: 5.1 mg/dL — AB (ref 2.5–4.6)
POTASSIUM: 4.3 mmol/L (ref 3.5–5.1)
Sodium: 135 mmol/L (ref 135–145)

## 2016-09-19 LAB — C4 COMPLEMENT: COMPLEMENT C4, BODY FLUID: 21 mg/dL (ref 14–44)

## 2016-09-19 LAB — HEPATITIS PANEL, ACUTE
HCV Ab: 4.4 s/co ratio — ABNORMAL HIGH (ref 0.0–0.9)
HEP A IGM: NEGATIVE
Hep B C IgM: NEGATIVE
Hepatitis B Surface Ag: NEGATIVE

## 2016-09-19 LAB — COMPLEMENT, TOTAL

## 2016-09-19 LAB — C3 COMPLEMENT: C3 COMPLEMENT: 142 mg/dL (ref 82–167)

## 2016-09-19 LAB — MPO/PR-3 (ANCA) ANTIBODIES: ANCA Proteinase 3: <3.5 U/mL (ref 0.0–3.5)

## 2016-09-19 LAB — PHOSPHORUS: PHOSPHORUS: 5.2 mg/dL — AB (ref 2.5–4.6)

## 2016-09-19 LAB — MAGNESIUM: Magnesium: 2.1 mg/dL (ref 1.7–2.4)

## 2016-09-19 MED ORDER — ISOSORBIDE MONONITRATE ER 60 MG PO TB24
60.0000 mg | ORAL_TABLET | Freq: Every day | ORAL | Status: DC
  Administered 2016-09-19 – 2016-09-25 (×7): 60 mg via ORAL
  Filled 2016-09-19 (×7): qty 1

## 2016-09-19 MED ORDER — BISACODYL 5 MG PO TBEC
5.0000 mg | DELAYED_RELEASE_TABLET | Freq: Every day | ORAL | Status: DC | PRN
  Administered 2016-09-19 – 2016-09-20 (×2): 5 mg via ORAL
  Filled 2016-09-19 (×2): qty 1

## 2016-09-19 MED ORDER — OXYCODONE HCL 5 MG PO TABS
5.0000 mg | ORAL_TABLET | Freq: Four times a day (QID) | ORAL | Status: DC | PRN
  Administered 2016-09-19 – 2016-09-25 (×20): 5 mg via ORAL
  Filled 2016-09-19 (×21): qty 1

## 2016-09-19 MED ORDER — FUROSEMIDE 10 MG/ML IJ SOLN
40.0000 mg | Freq: Every day | INTRAMUSCULAR | Status: DC
Start: 2016-09-19 — End: 2016-09-20
  Administered 2016-09-19: 40 mg via INTRAVENOUS
  Filled 2016-09-19 (×2): qty 4

## 2016-09-19 MED ORDER — ALUM & MAG HYDROXIDE-SIMETH 200-200-20 MG/5ML PO SUSP
15.0000 mL | ORAL | Status: DC | PRN
  Administered 2016-09-19 – 2016-09-23 (×10): 15 mL via ORAL
  Filled 2016-09-19 (×10): qty 30

## 2016-09-19 MED ORDER — HYDRALAZINE HCL 50 MG PO TABS
100.0000 mg | ORAL_TABLET | Freq: Three times a day (TID) | ORAL | Status: DC
  Administered 2016-09-19 – 2016-09-25 (×20): 100 mg via ORAL
  Filled 2016-09-19 (×21): qty 2

## 2016-09-19 MED ORDER — NIFEDIPINE ER OSMOTIC RELEASE 30 MG PO TB24
60.0000 mg | ORAL_TABLET | Freq: Every day | ORAL | Status: DC
  Administered 2016-09-19 – 2016-09-20 (×2): 60 mg via ORAL
  Filled 2016-09-19: qty 2
  Filled 2016-09-19: qty 1

## 2016-09-19 NOTE — Progress Notes (Signed)
PT Cancellation Note  Patient Details Name: Austin Simmons MRN: [id number] DOB: 1960-09-19   Cancelled Treatment:    Reason Eval/Treat Not Completed: Other (comment) ([name] as he is waiting for wife. Austin Simmons.) Will return as able. Thanks.    Amadeo GarnetDawn F Jazmyne Beauchesne 09/19/2016, 10:06 AM  Eber Jonesawn Olia Hinderliter,PT Acute Rehabilitation [phone])

## 2016-09-19 NOTE — Progress Notes (Signed)
Patient ID: Austin Simmons, male   DOB: 10/04/1960, 56 y.o.   MRN: [id number]  [facility] Progress Note   Assessment/ Plan:   1. Acute kidney injury on chronic kidney disease stage III: This appears to be bi-factorial from recent contrast exposure as well as hemodynamically mediated renal injury with the need for acute blood pressure lowering. Nonoliguric overnight and stable renal function noted on labs yesterday--awaiting AM labs today. 2. Type B acute aortic dissection:  Will assist in titration of antihypertensive therapy. No plans for intervention at this time for vascular surgery. 3. Hypertension:  compounded by substance abuse/cocaine use and poor adherence to antihypertensive therapy. Continues to have difficulty in tight BP control off cardene gtt--will start nifedipine XL in addition to current agents including furosemide.  4. Anemia: With iron deficiency--on IV iron.  Subjective:   Complains of pain over back/chest and lower abdomen with insomnia overnight.    Objective:   BP (!) 177/89 (BP Location: Left Arm) Comment: prn labetolol given, with am meds  Pulse 83   Temp 97.9 F (36.6 C) (Oral)   Resp 20   Ht 5\' 5"  (1.651 m)   Wt 86.7 kg (191 lb 3.2 oz)   SpO2 98%   BMI 31.82 kg/m   Intake/Output Summary (Last 24 hours) at 09/19/16 [id number] Last data filed at 09/18/16 2153  Gross per 24 hour  Intake          1585.33 ml  Output              525 ml  Net          1060.33 ml   Weight change:   Physical Exam: Gen: Uncomfortably sitting up in recliner- trying to eat breakfast CVS: Pulse regular rhythm, normal rate, S1 and S2 Resp: Clear to auscultation bilaterally, no rales Abd: Soft, obese, nontender Ext: 1-2+ left lower extremity edema, 1+ right lower extremity edema  Imaging: [place] Renal  Result Date: 09/17/2016 CLINICAL DATA:  Acute on chronic renal failure, hypertension EXAM: RENAL / URINARY TRACT ULTRASOUND COMPLETE COMPARISON:  CT abdomen pelvis of  10/28/2012 FINDINGS: Right Kidney: Length: 11.0 cm. No hydronephrosis is seen. The echogenicity of the right renal parenchyma is increased consistent with chronic renal medical disease. Left Kidney: Length: 10.5 cm. No hydronephrosis is seen. The echogenicity of the left renal parenchymal also is increased. Bladder: The urinary bladder is unremarkable. IMPRESSION: 1. Echogenic renal parenchyma bilaterally consistent with chronic renal medical disease. 2. No hydronephrosis. Electronically Signed   By: Dwyane DeePaul  Barry M.D.   On: 09/17/2016 15:48    Labs: BMET  Recent Labs Lab 09/15/16 2232 09/16/16 0607 09/17/16 0328 09/18/16 0709  NA 128* 135 131* 133*  K 2.9* 3.0* 3.5 4.0  CL 92* 99* 99* 101  CO2 25 24 22  21*  GLUCOSE 125* 119* 122* 103*  BUN 54* 48* 53* 51*  CREATININE 4.02* 3.57* 4.50* 4.57*  CALCIUM 8.0* 7.7* 7.7* 8.0*  PHOS  --  3.2  --  5.1*   CBC  Recent Labs Lab 09/15/16 2232 09/16/16 0607 09/17/16 0328  WBC 12.2* 10.5 8.0  HGB 9.7* 9.2* 8.4*  HCT 31.6* 29.0* 27.0*  MCV 88.3 87.6 87.9  PLT 298 256 212   Medications:    . Chlorhexidine Gluconate Cloth  6 each Topical Q0600  . furosemide  40 mg Intravenous Daily  . hydrALAZINE  100 mg Oral TID  . isosorbide mononitrate  60 mg Oral Daily  . labetalol  300 mg Oral  BID  . levothyroxine  50 mcg Oral QAC breakfast  . mupirocin ointment  1 application Nasal BID  . pantoprazole  40 mg Oral Daily   Zetta Bills, MD 09/19/2016, 8:26 AM

## 2016-09-19 NOTE — Progress Notes (Signed)
Progress Note  Patient Name: Austin Simmons Date of Encounter: 09/19/2016  Primary Cardiologist: Dr Anne Fu  Subjective   No dyspnea; mild chest and abdominal pain  Inpatient Medications    Scheduled Meds: . Chlorhexidine Gluconate Cloth  6 each Topical Q0600  . hydrALAZINE  75 mg Oral TID  . isosorbide mononitrate  30 mg Oral Daily  . labetalol  300 mg Oral BID  . levothyroxine  50 mcg Oral QAC breakfast  . mupirocin ointment  1 application Nasal BID  . pantoprazole  40 mg Oral Daily   Continuous Infusions: . sodium chloride    . ferumoxytol Stopped (09/18/16 0953)  . niCARDipine 3 mg/hr (09/19/16 0106)   PRN Meds: sodium chloride, acetaminophen-codeine, bisacodyl, hydrALAZINE, labetalol, LORazepam   Vital Signs    Vitals:   09/18/16 2205 09/18/16 2300 09/18/16 2310 09/19/16 0300  BP: (!) 177/94 (!) 165/83    Pulse:  83    Resp:  19    Temp:   97.8 F (36.6 C) 98 F (36.7 C)  TempSrc:   Oral Oral  SpO2:  99%    Weight:      Height:        Intake/Output Summary (Last 24 hours) at 09/19/16 0736 Last data filed at 09/18/16 2153  Gross per 24 hour  Intake          1585.33 ml  Output              525 ml  Net          1060.33 ml   Filed Weights   09/16/16 0400 09/17/16 0500 09/18/16 0338  Weight: 79.8 kg (175 lb 14.8 oz) 84.3 kg (185 lb 13.6 oz) 86.7 kg (191 lb 3.2 oz)    Telemetry    Sinus with rare PVC- Personally Reviewed    Physical Exam   GEN: WD/obese NAD Neck: supple Cardiac: RRR; no murmur; 2+ carotid, radial and femoral pulses Respiratory: Clear to auscultation bilaterally. No rhonchi GI: Soft, mildly tender and distended, 2+ femoral pulses MS: 1+ edema; warm Neuro:  No focal findings    Labs    Chemistry  Recent Labs Lab 09/16/16 0607 09/17/16 0328 09/18/16 0709  NA 135 131* 133*  K 3.0* 3.5 4.0  CL 99* 99* 101  CO2 24 22 21*  GLUCOSE 119* 122* 103*  BUN 48* 53* 51*  CREATININE 3.57* 4.50* 4.57*  CALCIUM 7.7* 7.7* 8.0*   ALBUMIN  --   --  2.6*  GFRNONAA 18* 14* 13*  GFRAA 21* 16* 16*  ANIONGAP 12 10 11      Hematology  Recent Labs Lab 09/15/16 2232 09/16/16 0607 09/17/16 0328  WBC 12.2* 10.5 8.0  RBC 3.58* 3.31* 3.07*  HGB 9.7* 9.2* 8.4*  HCT 31.6* 29.0* 27.0*  MCV 88.3 87.6 87.9  MCH 27.1 27.8 27.4  MCHC 30.7 31.7 31.1  RDW 16.2* 16.6* 16.5*  PLT 298 256 212    Cardiac Enzymes  Recent Labs Lab 09/15/16 2232 09/16/16 0607 09/16/16 1024 09/16/16 1617  TROPONINI 0.27* 0.24* 0.51* 0.96*    Radiology    US Renal  Result Date: 09/17/2016 CLINICAL DATA:  Acute on chronic renal failure, hypertension EXAM: RENAL / URINARY TRACT ULTRASOUND COMPLETE COMPARISON:  CT abdomen pelvis of 10/28/2012 FINDINGS: Right Kidney: Length: 11.0 cm. No hydronephrosis is seen. The echogenicity of the right renal parenchyma is increased consistent with chronic renal medical disease. Left Kidney: Length: 10.5 cm. No hydronephrosis is seen. The echogenicity of  the left renal parenchymal also is increased. Bladder: The urinary bladder is unremarkable. IMPRESSION: 1. Echogenic renal parenchyma bilaterally consistent with chronic renal medical disease. 2. No hydronephrosis. Electronically Signed   By: Dwyane DeePaul  Barry M.D.   On: 09/17/2016 15:48     Patient Profile     56 year old male with past medical history of coronary artery disease, ischemic cardiomyopathy, prior CVA, hypertension, cocaine abuse, noncompliance admitted with hypertensive emergency, type B aortic dissection who we are asked to evaluate for dissection and hypertensive emergency. Patient presented with complaints of 2-3 days of chest, epigastric and abdominal pain. Initial blood pressure 243/147. Patient had not been taking medications at home. Drug screen positive for cocaine. CTA reveals type B aortic dissection distal to the left subclavian.   Assessment & Plan     1 type B aortic dissection-Plan medical therapy; femoral and radial pulses remain  palable; vascular surgery following. BP elevated. Increase hydralazine to 100 mg by mouth 3 times a day. Increase imdur to 60 mg daily. Continue labetalol 300 mg twice a day. Add lasix 40 mg daily (pt volume overloaded on exam); if BP does not improve with AM meds, resume cardene drip.  2 coronary artery disease-begin aspirin 81 mg daily and Lipitor 40 mg daily at discharge.  3 history of ischemic cardiomyopathy-EF 40-45 by echo. Continue labetalol and hydralazine/nitrates. He would not be a candidate for ARB or ACE inhibitor given severity of renal insufficiency.  4 cocaine abuse-previously counseled on discontinuing.  5 noncompliance-I explained the importance of compliance with medications.Pt does not seem to understand seriousness of dissection.   6 acute on chronic stage IV kidney disease-Add lasix as pt volume overloaded; follow renal function; nephrology following.  7 hypertensive emergency-blood pressure elevated this AM. Plan as outlined above.  Signed, Olga MillersBrian Peder Allums, MD  09/19/2016, 7:36 AM

## 2016-09-19 NOTE — Care Management Note (Signed)
Case Management Note  Patient Details  Name: Austin Simmons MRN: [id number] Date of Birth: [birth year]  Subjective/Objective:    From home presents with chest pain and hypertensive crisis with hx of  cocaine use and medical non-compliance. Cardiothoracic consulted for aortic dissection. Patient is very dysarthric, he states he has two sons, Austin BeeKevin and Austin Simmons.  He states a friend brought him to the hospital.  He may need transportation assistance at discharge.  He has no PCP, looks like he has been to the [facility] before, N[facility] at [facility].   8/30 1111 Letha Capeeborah Thamar Holik RN, BSN- per pt eval rec HHPT and rolling walker, NCM [facility] .  Soc will begin 24-48 hrs post dc.  Will need order for HHPT with face to face.  The DME order is in for rolling walker already.                                 Action/Plan: NCM will follow for dc needs.   Expected Discharge Date:                  Expected Discharge Plan:  Home w Home Health Services  In-House Referral:     Discharge planning Services  CM Consult, Follow-up appt scheduled  Post Acute Care Choice:  Durable Medical Equipment, Home Health Choice offered to:  Patient  DME Arranged:  Walker rolling DME Agency:  [facility]  [facility]  Status of Service:  Completed, signed off  If discussed at Long Length of Stay Meetings, dates discussed:    Additional Comments:  [name], Brittanny Levenhagen Clinton, RN 09/19/2016, 11:11 AM

## 2016-09-19 NOTE — Progress Notes (Signed)
PULMONARY / CRITICAL CARE MEDICINE   Name: Austin Simmons MRN: [id number] DOB: 1960-09-07    ADMISSION DATE:  09/15/2016  CHIEF COMPLAINT:  Chest pain 2-3 days  Brief patient summary:   56 year old male with PMH of CAD s/p MI 2014, ischemic cardiomyopathy, CVA unknown deficits, CKD, medical noncompliance and cocaine use presented to the emergency department on 8/26 hypertensive at 243/147 with severe chest pain, 9 out of 10, located mid chest near the xiphoid radiating into his abdomen is continuous is minimally better with opioids. The pain has been going on for 2-3 days he hasn't had pain like this before. He denies use of cocaine. He says he has not taken antihypertensive medications in quite some time.  CTA shows showed acute type B aortic dissection.  SUBJECTIVE:   Continues to complain of pain in chest wall and abdomen.  Asking for "oxy for my chest and perocet for down lower". BP improved, SBP 130's now.  Has remained off cardene since 11am 8/29.   VITAL SIGNS: BP (!) 162/89 (BP Location: Left Arm)   Pulse 83   Temp 97.9 F (36.6 C) (Oral)   Resp (!) 23   Ht 5\' 5"  (1.651 m)   Wt 86.7 kg (191 lb 3.2 oz)   SpO2 98%   BMI 31.82 kg/m   HEMODYNAMICS:   VENTILATOR SETTINGS:   INTAKE / OUTPUT: I/O last 3 completed shifts: In: 3424.5 [P.O.:1450; I.V.:1857.5; IV Piggyback:117] Out: 825 [Urine:825]  PHYSICAL EXAMINATION: General:  Adult male sitting in recliner eating breakfast, in NAD HEENT: [place] / AT.  MMM Neuro: A&O x 3, no deficits CV: RRR, No M/R/G.  Chest wall pain that is reproducible with palpation to anterior chest wall PULM: CTAB GI: protuberant, soft, bs active.  Tenderness lower abdomen, moans with palpation. Extremities: warm/dry, +2 distal pulses, trace BLE edema  Skin: no rashes or lesions  LABS:  BMET  Recent Labs Lab 09/17/16 0328 09/18/16 0709 09/19/16 0742  NA 131* 133* 135  K 3.5 4.0 4.3  CL 99* 101 103  CO2 22 21* 21*  BUN 53* 51* 48*   CREATININE 4.50* 4.57* 4.28*  GLUCOSE 122* 103* 85    Electrolytes  Recent Labs Lab 09/16/16 0607 09/17/16 0328 09/17/16 1023 09/18/16 0709 09/19/16 0742  CALCIUM 7.7* 7.7*  --  8.0* 8.3*  MG 1.7  --  1.9  --  2.1  PHOS 3.2  --   --  5.1* 5.2*  5.1*    CBC  Recent Labs Lab 09/16/16 0607 09/17/16 0328 09/19/16 0742  WBC 10.5 8.0 8.5  HGB 9.2* 8.4* 8.9*  HCT 29.0* 27.0* 28.9*  PLT 256 212 267    Coag's No results for input(s): APTT, INR in the last 168 hours.  Sepsis Markers No results for input(s): LATICACIDVEN, PROCALCITON, O2SATVEN in the last 168 hours.  ABG No results for input(s): PHART, PCO2ART, PO2ART in the last 168 hours.  Liver Enzymes  Recent Labs Lab 09/18/16 0709 09/19/16 0742  ALBUMIN 2.6* 2.6*    Cardiac Enzymes  Recent Labs Lab 09/16/16 0607 09/16/16 1024 09/16/16 1617  TROPONINI 0.24* 0.51* 0.96*    Glucose No results for input(s): GLUCAP in the last 168 hours.  Imaging No results found. STUDIES:  CTA 8/27>> acute aortic dissection type B extending from just distal to the left subclavian artery origin to the level of superior mesenteric artery; no extension into the branch vessels of the aorta.  Associated acute intramural hematoma w/in the distal aortic arch  and proximal descending thoracic aorta; small left pleural effusion and associated atelectasis.  Cardiomegaly and coronary artery calcification TTE 8/28 > EF 40-45%, G1DD. Renal US 8/28 > chronic renal disease, no hydro.  CULTURES: MRSA PCR 8/27 >> positive  ANTIBIOTICS: None  SIGNIFICANT EVENTS: Admitted to the ICU 8/27  LINES/TUBES: Left radial A-line 8/26 >>8/27  DISCUSSION: 56 year old man presented with severe chest pain and abdominal pain with past medical history of multivessel coronary artery disease, untreated hypertension, cocaine use admitted to the ICU with acute type B aortic dissection and hypertensive emergency  ASSESSMENT /  PLAN:  PULMONARY A: No acute issues P:   No interventions required  CARDIOVASCULAR A:  Hypertensive emergency secondary to medical noncompliance and cocaine abuse Type B aortic dissection- r//t to above Elevated troponins - due to above Hx CAD, MI w/PCI, uncontrolled HTN, ICM w/EF 40-45% 5/15, HLD P:  TCTS, Vascular, and Cards following, appreciate recs Surgery not indicated at this time with no mesenteric or limb ischemia  Continue with aggressive BP control - goal SBP < 140 Continue labetalol 300 mg BID, hydralazine 75mg  TID, Imdur 30mg  daily, nifedipine XL (added 8/30), furosemide. Continue to monitor off cardene D/c clonidine per cards Continue PRN hydralazine and labetalol Hold on ACE or ARB given renal fxn If CP or abd pain worsens, consider repeat CT (though suspect this is musculoskeletal as is completely reproducible)  RENAL A:   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- baseline sCr 2.5 Risk of acute contrast nephropathy s/p CTA P:   Nephrology following, appreciate recs Strict I/O, daily wts Replace electrolytes as indicated  BMP in AM  GASTROINTESTINAL A:   Mild consitpation P:   Renal diet- tolerating dulcolax prn  PPI for SUP  HEMATOLOGIC A:   DVT prophylaxis P:    SCDs  INFECTIOUS A:   No active issue MRSA PCR positive P:   Monitor  Contact precautions per protocol and continue nasal bactroban  ENDOCRINE A:   Hx Hypothyroid P:   Monitor glucose on BMP Continue levothyroxine 50 mcg daily  NEUROLOGIC A:   Pain and anxiety Hx CVA P:   D/c tylenol # 3 (states not working) Oxycodone ordered Continue PRN ativan  PT following    Transfer out of ICU to Telemetry.  Will ask TRH to assume care starting in AM 8/31.   FAMILY  - Updates: No family at bedside.   - Inter-disciplinary family meet or Palliative Care meeting due by:  09/24/26  CCT: 30 min.   Rutherford Guysahul Takisha Pelle, GeorgiaPA - C [place] [facility] Pager: [phone]  [phone]  or [phone] 09/19/2016, 8:59 AM

## 2016-09-19 NOTE — Progress Notes (Signed)
      [address]       [place] [id number]             [phone]     CARDIOTHORACIC SURGERY PROGRESS NOTE  Subjective: Still reports intermittent epigastric chest pain.  No SOB  Objective: Vital signs in last 24 hours: Temp:  [97.6 F (36.4 C)-98.4 F (36.9 C)] 98.4 F (36.9 C) (08/30 1328) Pulse Rate:  [80-83] 80 (08/30 1328) Cardiac Rhythm: Normal sinus rhythm (08/30 1326) Resp:  [17-30] 27 (08/30 1328) BP: (130-177)/(59-127) 155/75 (08/30 1328) SpO2:  [96 %-99 %] 97 % (08/30 1328)  Physical Exam:  Rhythm:   sinus  Breath sounds: clear  Heart sounds:  RRR  Incisions:  n/a  Abdomen:  Soft, non tender  Extremities:  Warm, well-perfused   Intake/Output from previous day: 08/29 0701 - 08/30 0700 In: 1585.3 [P.O.:1210; I.V.:258.3; IV Piggyback:117] Out: 525 [Urine:525] Intake/Output this shift: Total I/O In: 120 [P.O.:120] Out: 700 [Urine:700]  Lab Results:  Recent Labs  09/17/16 0328 09/19/16 0742  WBC 8.0 8.5  HGB 8.4* 8.9*  HCT 27.0* 28.9*  PLT 212 267   BMET:  Recent Labs  09/18/16 0709 09/19/16 0742  NA 133* 135  K 4.0 4.3  CL 101 103  CO2 21* 21*  GLUCOSE 103* 85  BUN 51* 48*  CREATININE 4.57* 4.28*  CALCIUM 8.0* 8.3*    CBG (last 3)  No results for input(s): GLUCAP in the last 72 hours. PT/INR:  No results for input(s): LABPROT, INR in the last 72 hours.  CXR:  N/A  Assessment/Plan:  Clinically stable although BP control remains suboptimal Renal function stable  Normally we would recommend f/u CTA chest/abd/pelvis prior to hospital d/c to assess stability of aortic dissection - given the patient's acute on chronic renal failure I am disinclined to administer IV contrast unless his condition were to change significantly  I am willing to see the patient in follow up in 3 months and get f/u CTA at that time if the patient remains compliant, drug-free, and is being followed by Cardiology and Nephrology.  Will  continue to follow intermittently while the patient remains in the hospital, but please call if specific problems or questions arise.  I spent in excess of 15 minutes during the conduct of this hospital encounter and >50% of this time involved direct face-to-face encounter with the patient for counseling and/or coordination of their care.  Austin Nailslarence H Nadine Ryle, MD 09/19/2016 1:49 PM

## 2016-09-19 NOTE — Care Management Important Message (Signed)
Important Message  Patient Details  Name: Austin Simmons MRN: [id number] Date of Birth: 10-22-1960   Medicare Important Message Given:  Yes    [name], Anorah Trias Clinton, RN 09/19/2016, 11:16 AMImportant Message  Patient Details  Name: Austin Simmons MRN: [id number] Date of Birth: 10-22-1960   Medicare Important Message Given:  Yes    [name], Deshon Koslowski Clinton, RN 09/19/2016, 11:16 AM

## 2016-09-19 NOTE — Progress Notes (Addendum)
     Subjective  - Complaints of chest pain to touch and movement.   Objective (!) 165/83 83 98 F (36.7 C) (Oral) 19 99%  Intake/Output Summary (Last 24 hours) at 09/19/16 0732 Last data filed at 09/18/16 2153  Gross per 24 hour  Intake          1585.33 ml  Output              525 ml  Net          1060.33 ml    Palpable DP B Palpable radial pulses Abdomen soft NTTP Heart RRR   Assessment/Planning: POD # 1 Acute Type B aortic dissection without mesenteric or extremity malperfusion, CIN  BP elevated this am 173/86 s/pHydralazine given, pending addition of Labetalol per Nursing. No change in complaints over all No plan for vascular intervention at this time per Dr. Imogene Burnhen.  Clinton GallantCOLLINS, EMMA [facility] Center[facility]EN 09/19/2016 7:32 AM    Addendum  I have independently interviewed and examined the patient, and I agree with the physician assistant's findings.  BP control still marginal with PO agents.  Still requiring prn IV med.  Pt has variable response to inquiries in regards to his pain.  He responds no to me and yes to my PA.  - Still no evidence of malperfusion - Strict BP control critical to avoiding dissection extension and development of additional sequale - Pt continued refusal to address recreational drug use will likely complicate his future mgmt - Inadequate HTN control can cause in-folding of a thoracic endograft, so TEVAR in itself is NOT an adequate solution to this patient's problems   Leonides SakeBrian Macklin Jacquin, MD, FACS [facility] [place] Office: [phone] Pager: [phone]  09/19/2016, 9:27 AM  --  Laboratory Lab Results:  Recent Labs  09/17/16 0328  WBC 8.0  HGB 8.4*  HCT 27.0*  PLT 212   BMET  Recent Labs  09/17/16 0328 09/18/16 0709  NA 131* 133*  K 3.5 4.0  CL 99* 101  CO2 22 21*  GLUCOSE 122* 103*  BUN 53* 51*  CREATININE 4.50* 4.57*  CALCIUM 7.7* 8.0*    COAG Lab Results  Component Value Date   INR 0.96 06/14/2016   INR 0.89 04/07/2014   INR 0.88 06/17/2013   No results found for: PTT

## 2016-09-20 ENCOUNTER — Inpatient Hospital Stay (HOSPITAL_COMMUNITY): Payer: Medicare Other

## 2016-09-20 DIAGNOSIS — I7102 Dissection of abdominal aorta: Secondary | ICD-10-CM

## 2016-09-20 DIAGNOSIS — I255 Ischemic cardiomyopathy: Secondary | ICD-10-CM

## 2016-09-20 DIAGNOSIS — N183 Chronic kidney disease, stage 3 (moderate): Secondary | ICD-10-CM

## 2016-09-20 DIAGNOSIS — I10 Essential (primary) hypertension: Secondary | ICD-10-CM

## 2016-09-20 DIAGNOSIS — F1410 Cocaine abuse, uncomplicated: Secondary | ICD-10-CM

## 2016-09-20 DIAGNOSIS — N189 Chronic kidney disease, unspecified: Secondary | ICD-10-CM

## 2016-09-20 LAB — RENAL FUNCTION PANEL
Albumin: 2.6 g/dL — ABNORMAL LOW (ref 3.5–5.0)
Anion gap: 9 (ref 5–15)
BUN: 50 mg/dL — AB (ref 6–20)
CO2: 22 mmol/L (ref 22–32)
Calcium: 8.4 mg/dL — ABNORMAL LOW (ref 8.9–10.3)
Chloride: 104 mmol/L (ref 101–111)
Creatinine, Ser: 4.45 mg/dL — ABNORMAL HIGH (ref 0.61–1.24)
GFR, EST AFRICAN AMERICAN: 16 mL/min — AB (ref >60)
GFR, EST NON AFRICAN AMERICAN: 14 mL/min — AB (ref >60)
Glucose, Bld: 89 mg/dL (ref 65–99)
PHOSPHORUS: 6 mg/dL — AB (ref 2.5–4.6)
Potassium: 4 mmol/L (ref 3.5–5.1)
Sodium: 135 mmol/L (ref 135–145)

## 2016-09-20 LAB — GLUCOSE, CAPILLARY: GLUCOSE-CAPILLARY: 84 mg/dL (ref 65–99)

## 2016-09-20 MED ORDER — BISACODYL 10 MG RE SUPP
10.0000 mg | Freq: Once | RECTAL | Status: AC
  Administered 2016-09-20: 10 mg via RECTAL
  Filled 2016-09-20: qty 1

## 2016-09-20 MED ORDER — POLYETHYLENE GLYCOL 3350 17 G PO PACK
17.0000 g | PACK | Freq: Every day | ORAL | Status: DC
  Administered 2016-09-20 – 2016-09-25 (×4): 17 g via ORAL
  Filled 2016-09-20 (×4): qty 1

## 2016-09-20 MED ORDER — CLEVIDIPINE BUTYRATE 0.5 MG/ML IV EMUL
0.0000 mg/h | INTRAVENOUS | Status: DC
  Administered 2016-09-20: 18 mg/h via INTRAVENOUS
  Administered 2016-09-21 (×2): 21 mg/h via INTRAVENOUS
  Administered 2016-09-21: 19 mg/h via INTRAVENOUS
  Administered 2016-09-21 (×2): 20 mg/h via INTRAVENOUS
  Administered 2016-09-21: 19 mg/h via INTRAVENOUS
  Administered 2016-09-21: 18 mg/h via INTRAVENOUS
  Administered 2016-09-21: 19 mg/h via INTRAVENOUS
  Administered 2016-09-22 – 2016-09-23 (×16): 20 mg/h via INTRAVENOUS
  Administered 2016-09-23: 21 mg/h via INTRAVENOUS
  Administered 2016-09-24: 17 mg/h via INTRAVENOUS
  Administered 2016-09-24: 20 mg/h via INTRAVENOUS
  Administered 2016-09-24: 21 mg/h via INTRAVENOUS
  Administered 2016-09-24: 10 mg/h via INTRAVENOUS
  Administered 2016-09-24: 21 mg/h via INTRAVENOUS
  Administered 2016-09-24: 10 mg/h via INTRAVENOUS
  Administered 2016-09-25: 18 mg/h via INTRAVENOUS
  Administered 2016-09-25: 4 mg/h via INTRAVENOUS
  Administered 2016-09-25: 6 mg/h via INTRAVENOUS
  Filled 2016-09-20 (×38): qty 100

## 2016-09-20 MED ORDER — CLEVIDIPINE BUTYRATE 0.5 MG/ML IV EMUL
0.0000 mg/h | INTRAVENOUS | Status: AC
  Administered 2016-09-20: 12 mg/h via INTRAVENOUS
  Administered 2016-09-20 (×2): 18 mg/h via INTRAVENOUS
  Administered 2016-09-20: 16 mg/h via INTRAVENOUS
  Administered 2016-09-20: 4 mg/h via INTRAVENOUS
  Administered 2016-09-20: 16 mg/h via INTRAVENOUS
  Filled 2016-09-20 (×5): qty 50

## 2016-09-20 MED ORDER — NIFEDIPINE ER OSMOTIC RELEASE 90 MG PO TB24
90.0000 mg | ORAL_TABLET | Freq: Every day | ORAL | Status: DC
  Administered 2016-09-20 – 2016-09-25 (×6): 90 mg via ORAL
  Filled 2016-09-20 (×6): qty 1

## 2016-09-20 MED ORDER — FUROSEMIDE 10 MG/ML IJ SOLN
80.0000 mg | Freq: Two times a day (BID) | INTRAMUSCULAR | Status: DC
  Administered 2016-09-20 – 2016-09-24 (×7): 80 mg via INTRAVENOUS
  Filled 2016-09-20 (×9): qty 8

## 2016-09-20 MED ORDER — NICARDIPINE HCL IN NACL 20-0.86 MG/200ML-% IV SOLN
5.0000 mg/h | INTRAVENOUS | Status: DC
  Filled 2016-09-20: qty 200

## 2016-09-20 MED ORDER — NICARDIPINE HCL IN NACL 20-0.86 MG/200ML-% IV SOLN
3.0000 mg/h | INTRAVENOUS | Status: DC
  Administered 2016-09-20: 5 mg/h via INTRAVENOUS
  Filled 2016-09-20 (×2): qty 200

## 2016-09-20 MED ORDER — CLEVIDIPINE 50 MG/100ML IV EMUL
0.0000 mg/h | INTRAVENOUS | Status: DC
  Administered 2016-09-20: 18 mg/h via INTRAVENOUS
  Filled 2016-09-20 (×2): qty 100

## 2016-09-20 MED ORDER — ALPRAZOLAM 0.5 MG PO TABS
0.5000 mg | ORAL_TABLET | Freq: Once | ORAL | Status: AC
  Administered 2016-09-20: 0.5 mg via ORAL
  Filled 2016-09-20: qty 1

## 2016-09-20 NOTE — Progress Notes (Signed)
OT Cancellation Note  Patient Details Name: Austin Simmons E Sandra MRN: [id number] DOB: 03/22/1960   Cancelled Treatment:    Reason Eval/Treat Not Completed: Medical issues which prohibited therapy. Pt transfered to ICU this AM to restart nicardipine drip for uncontrolled hypertension with associated chest pain/abdominal pain  [name], [name] [name] [phone] 09/20/2016, 11:32 AM

## 2016-09-20 NOTE — Progress Notes (Addendum)
      [address]       [name] [id number]             [phone]            Subjective: Patient with lower abdominal pain and epigastric pain. He states he is also bloated and because of this he cannot eat.  Objective: Vital signs in last 24 hours: Temp:  [97.6 F (36.4 C)-98.4 F (36.9 C)] 98 F (36.7 C) (08/31 0423) Pulse Rate:  [63-88] 79 (08/31 0700) Cardiac Rhythm: Normal sinus rhythm (08/30 2000) Resp:  [20-30] 25 (08/31 0700) BP: (135-177)/(75-127) 144/81 (08/31 0700) SpO2:  [70 %-98 %] 70 % (08/31 0700) Weight:  [191 lb 8 oz (86.9 kg)] 191 lb 8 oz (86.9 kg) (08/31 0548)   Current Weight  09/20/16 191 lb 8 oz (86.9 kg)      Intake/Output from previous day: 08/30 0701 - 08/31 0700 In: 360 [P.O.:360] Out: 700 [Urine:700]   Physical Exam:  Cardiovascular: RRR Pulmonary: Clear to auscultation bilaterally Abdomen: Soft, some tenderness to deep palpation, protuberant,bowel sounds present. Extremities: Mild bilateral lower extremity edema. Feet warm   Lab Results: CBC: Recent Labs  09/19/16 0742  WBC 8.5  HGB 8.9*  HCT 28.9*  PLT 267   BMET:  Recent Labs  09/19/16 0742 09/20/16 0241  NA 135 135  K 4.3 4.0  CL 103 104  CO2 21* 22  GLUCOSE 85 89  BUN 48* 50*  CREATININE 4.28* 4.45*  CALCIUM 8.3* 8.4*    PT/INR:  Lab Results  Component Value Date   INR 0.96 06/14/2016   INR 0.89 04/07/2014   INR 0.88 06/17/2013   ABG:  INR: Will add last result for INR, ABG once components are confirmed Will add last 4 CBG results once components are confirmed  Assessment/Plan:  1. CV - Type B acute aortic dissection. HR in the 80's. SBP 140-160's. On Hydralazine 100 mg tid, Labetalol 300 mg bid, Nifedipine 60 mg daily, and Imdur 60 mg daily. Per Dr. Cornelius Moraswen, no CTA chest/abd/pelvis to reassess aorta secondary to renal failure.  2.  Pulmonary - On room air. 3. Acute kidney injury on chronic kidney disease stage KGM:[id number]:Creatinine  slightly increased to 4.45. Per nephrology 4. Anemia-on Feraheme  ZIMMERMAN,DONIELLE MPA-C 09/20/2016,7:45 AM  I have seen and examined the patient and agree with the assessment and plan as outlined.  Abdomen is moderately distended and bloated but only mildly tender and w/out any peritoneal signs.  Abdominal x-ray c/w ileus which may be due to hypoperfusion and/or narcotics.  If patient develops peritoneal signs he might need f/u CTA despite concerns regarding nephrotoxicity, but I would favor holding off for now.  I agree w/ plans to return to ICU.  BP control can be challenging in patients w/ aortic dissection, but it remains critically important.   I spent in excess of 15 minutes during the conduct of this hospital encounter and >50% of this time involved direct face-to-face encounter with the patient for counseling and/or coordination of their care.   Purcell Nailslarence H Owen, MD 09/20/2016 10:55 AM

## 2016-09-20 NOTE — Progress Notes (Signed)
PROGRESS NOTE    Austin RivalJames E Simmons  MVH:[id number] DOB: 1960/07/10 DOA: 09/15/2016 PCP: System, Pcp Not In    Brief Narrative:  56 year old male who presented with chest pain. Patient is known to have ischemic cardiomyopathy, history of CVA and cocaine abuse. He presented with precordial chest pain, 9 out of 10 in intensity radiated into his abdomen, for last 2-3 days prior to hospitalization. On his initial physical examination blood pressure was 203/88, heart rate 103, temperature is 97.7, respiratory rate 25, oxygenation saturation 96%. Patient was awake and alert, his lungs were noted to auscultation bilaterally, heart S1-S2 present and rhythmic, abdomen was protuberant and distended. Positive extremity edema 2+. Patient was found to have a type B aortic dissection.   Patient was admitted to the intensive care unit with a working diagnosis of hypertensive emergency complicated by a type B aortic dissection.   Patient was placed on aggressive antihypertensive regimen, he developed acute kidney injury. Transfer to stepdown unit on August 31.   Assessment & Plan:   Principal Problem:   Descending thoracic aortic dissection (HCC) Active Problems:   Cocaine abuse   Hypertension   Ischemic cardiomyopathy   Hypertensive emergency   AKI (acute kidney injury) (HCC)   CKD (chronic kidney disease), stage III   Pressure injury of skin   Hypertensive cardiomyopathy (HCC)   H/O noncompliance with medical treatment, presenting hazards to health   Acute renal failure superimposed on chronic kidney disease (HCC)   1. Hypertensive emergency complicated by type B aortic dissection. Blood pressure still uncontrolled, target 120 systolic per cardiology recommendations will resume nicardipine drip. I spoke with critical care attending, discussed the case with him, patient will be transfer to ICU for tight blood pressures control. Will continue blood pressure control with hydralazine, labetalol,  isosorbide  2. Acute kidney injury on chronic kidney disease stage III. Renal function with cr at 4.45 with K at 4.0 and serum bicarbonate at 22. Positive volume overload, will continue aggressive diuresis with furosemide 80 mg IV q12  3. Cocaine abuse. Patient tested positive for cocaine on admission,  may use benzodiazepines if needed.   4. Hypothyroidism. Continue levothyroxine.    DVT prophylaxis: scd  Code Status: Full  Family Communication:  Disposition Plan:    Consultants:   Cardiology   Nephrology   Procedures:     Antimicrobials:      Subjective: Patient complains of chest pain 10/10 intensity, precordial and radiated to the lower abdomen, persistent with no improving or worsening factors, no associated nausea or vomiting. No headache or dyspnea.   Objective: Vitals:   09/20/16 0423 09/20/16 0548 09/20/16 0615 09/20/16 0700  BP: (!) 163/84  (!) 163/92 (!) 144/81  Pulse: 88   79  Resp: (!) 24  (!) 21 (!) 25  Temp: 98 F (36.7 C)     TempSrc: Oral     SpO2: 97%   (!) 70%  Weight:  86.9 kg (191 lb 8 oz)    Height:        Intake/Output Summary (Last 24 hours) at 09/20/16 0919 Last data filed at 09/20/16 0300  Gross per 24 hour  Intake              240 ml  Output              600 ml  Net             -360 ml   Filed Weights   09/17/16 0500 09/18/16 0338 09/20/16  0548  Weight: 84.3 kg (185 lb 13.6 oz) 86.7 kg (191 lb 3.2 oz) 86.9 kg (191 lb 8 oz)    Examination:  General: in pain, deconditioned and ill looking appearing Neurology: Awake and alert, non focal  E ENT: mild pallor, no icterus, oral mucosa moist Cardiovascular: S1-S2 present, rhythmic, no gallops, rubs, or murmurs. No jugular venous distention, positive lower extremity ++ edema, pitting. Pulmonary: vesicular breath sounds bilaterally, adequate air movement, no wheezing, rhonchi or rales. Gastrointestinal. Abdomen flat, no organomegaly, non tender, no rebound or guarding Skin. No  rashes Musculoskeletal: no joint deformities     Data Reviewed: I have personally reviewed following labs and imaging studies  CBC:  Recent Labs Lab 09/15/16 2232 09/16/16 0607 09/17/16 0328 09/19/16 0742  WBC 12.2* 10.5 8.0 8.5  HGB 9.7* 9.2* 8.4* 8.9*  HCT 31.6* 29.0* 27.0* 28.9*  MCV 88.3 87.6 87.9 87.8  PLT 298 256 212 267   Basic Metabolic Panel:  Recent Labs Lab 09/16/16 0607 09/17/16 0328 09/17/16 1023 09/18/16 0709 09/19/16 0742 09/20/16 0241  NA 135 131*  --  133* 135 135  K 3.0* 3.5  --  4.0 4.3 4.0  CL 99* 99*  --  101 103 104  CO2 24 22  --  21* 21* 22  GLUCOSE 119* 122*  --  103* 85 89  BUN 48* 53*  --  51* 48* 50*  CREATININE 3.57* 4.50*  --  4.57* 4.28* 4.45*  CALCIUM 7.7* 7.7*  --  8.0* 8.3* 8.4*  MG 1.7  --  1.9  --  2.1  --   PHOS 3.2  --   --  5.1* 5.2*  5.1* 6.0*   GFR: Estimated Creatinine Clearance: 19.5 mL/min (A) (by C-G formula based on SCr of 4.45 mg/dL (H)). Liver Function Tests:  Recent Labs Lab 09/18/16 0709 09/19/16 0742 09/20/16 0241  ALBUMIN 2.6* 2.6* 2.6*   No results for input(s): LIPASE, AMYLASE in the last 168 hours. No results for input(s): AMMONIA in the last 168 hours. Coagulation Profile: No results for input(s): INR, PROTIME in the last 168 hours. Cardiac Enzymes:  Recent Labs Lab 09/15/16 2232 09/16/16 0607 09/16/16 1024 09/16/16 1617  TROPONINI 0.27* 0.24* 0.51* 0.96*   BNP (last 3 results) No results for input(s): PROBNP in the last 8760 hours. HbA1C: No results for input(s): HGBA1C in the last 72 hours. CBG:  Recent Labs Lab 09/19/16 1613 09/19/16 2029 09/20/16 0619  GLUCAP 169* 123* 84   Lipid Profile: No results for input(s): CHOL, HDL, LDLCALC, TRIG, CHOLHDL, LDLDIRECT in the last 72 hours. Thyroid Function Tests: No results for input(s): TSH, T4TOTAL, FREET4, T3FREE, THYROIDAB in the last 72 hours. Anemia Panel:  Recent Labs  09/17/16 1939  FERRITIN 50  TIBC 438  IRON 18*       Radiology Studies: I have reviewed all of the imaging during this hospital visit personally     Scheduled Meds: . Chlorhexidine Gluconate Cloth  6 each Topical Q0600  . furosemide  80 mg Intravenous BID  . hydrALAZINE  100 mg Oral TID  . isosorbide mononitrate  60 mg Oral Daily  . labetalol  300 mg Oral BID  . levothyroxine  50 mcg Oral QAC breakfast  . pantoprazole  40 mg Oral Daily   Continuous Infusions: . sodium chloride    . ferumoxytol Stopped (09/18/16 0953)  . niCARDipine       LOS: 4 days      Solangel Mcmanaway Annett Gula, MD [facility] Pager [phone]

## 2016-09-20 NOTE — Care Management Note (Signed)
Case Management Note Original Note Created [name], Deborah Clinton, RN 09/19/2016, 11:11 AM   Patient Details  Name: Renee RivalJames E Seckinger MRN: [id number] Date of Birth: [birth year]  Subjective/Objective:    From home presents with chest pain and hypertensive crisis with hx of  cocaine use and medical non-compliance. Cardiothoracic consulted for aortic dissection. Patient is very dysarthric, he states he has two sons, Caryn BeeKevin and Brayton CavesJessie.  He states a friend brought him to the hospital.  He may need transportation assistance at discharge.  He has no PCP, looks like he has been to the [facility] before, [facility].   8/30 1111 Letha Capeeborah Taylor RN, BSN- per pt eval rec HHPT and rolling walker, NCM offered [facility], [facility]na [facility] .  Soc will begin 24-48 hrs post dc.  Will need order for HHPT with face to face.  The DME order is in for rolling walker already.                                 Action/Plan: NCM will follow for dc needs.   Additional CM follow up notes:  09/20/16- 1015- Donn PieriniKristi Shourya Macpherson RN, [phone]- [phone]- pt will need to tx to ICU this AM for Cardene drip for elevated BP- [facility]- pt will need HHPT orders placed prior to discharge- and DME-RW delivered to room. CM will continue to follow for d/c needs.  Expected Discharge Date:                  Expected Discharge Plan:  Home w Home Health Services  In-House Referral:     Discharge planning Services  CM Consult, Follow-up appt scheduled  Post Acute Care Choice:  Durable Medical Equipment, Home Health Choice offered to:  Patient  DME Arranged:  Walker rolling DME Agency:  [facility]  [facility]  [facility]  Status of Service:  In process, will continue to follow  If discussed at Long Length of Stay Meetings, dates discussed:    Discharge  Disposition:   Additional Comments:  Darrold SpanWebster, Liana Camerer Hall, RN 09/20/2016, 10:17 AM [phone]

## 2016-09-20 NOTE — Progress Notes (Addendum)
Progress Note  Patient Name: Austin Simmons E Dambrosia Date of Encounter: 09/20/2016  Primary Cardiologist: Dr Anne FuSkains  Subjective   Pt complains of increased abdominal and chest pain; mild dyspnea  Inpatient Medications    Scheduled Meds: . Chlorhexidine Gluconate Cloth  6 each Topical Q0600  . furosemide  40 mg Intravenous Daily  . hydrALAZINE  100 mg Oral TID  . isosorbide mononitrate  60 mg Oral Daily  . labetalol  300 mg Oral BID  . levothyroxine  50 mcg Oral QAC breakfast  . NIFEdipine  60 mg Oral Daily  . pantoprazole  40 mg Oral Daily   Continuous Infusions: . sodium chloride    . ferumoxytol Stopped (09/18/16 0953)   PRN Meds: sodium chloride, alum & mag hydroxide-simeth, bisacodyl, hydrALAZINE, labetalol, LORazepam, oxyCODONE   Vital Signs    Vitals:   09/20/16 0423 09/20/16 0548 09/20/16 0615 09/20/16 0700  BP: (!) 163/84  (!) 163/92 (!) 144/81  Pulse: 88   79  Resp: (!) 24  (!) 21 (!) 25  Temp: 98 F (36.7 C)     TempSrc: Oral     SpO2: 97%   (!) 70%  Weight:  86.9 kg (191 lb 8 oz)    Height:        Intake/Output Summary (Last 24 hours) at 09/20/16 0844 Last data filed at 09/20/16 0300  Gross per 24 hour  Intake              240 ml  Output              700 ml  Net             -460 ml   Filed Weights   09/17/16 0500 09/18/16 0338 09/20/16 0548  Weight: 84.3 kg (185 lb 13.6 oz) 86.7 kg (191 lb 3.2 oz) 86.9 kg (191 lb 8 oz)    Telemetry    Sinus- Personally Reviewed    Physical Exam   GEN: WD/obese complaining of chest and abdominal pain Neck: supple, no JVD Cardiac: RRR; no murmur or gallops; 2+ carotid, radial and femoral pulses Respiratory: Clear to auscultation bilaterally. No wheeze GI: Soft, mildly tender and distended MS: 2+ edema; warm Neuro:  grossly intact    Labs    Chemistry  Recent Labs Lab 09/18/16 0709 09/19/16 0742 09/20/16 0241  NA 133* 135 135  K 4.0 4.3 4.0  CL 101 103 104  CO2 21* 21* 22  GLUCOSE 103* 85 89    BUN 51* 48* 50*  CREATININE 4.57* 4.28* 4.45*  CALCIUM 8.0* 8.3* 8.4*  ALBUMIN 2.6* 2.6* 2.6*  GFRNONAA 13* 14* 14*  GFRAA 16* 17* 16*  ANIONGAP 11 11 9      Hematology  Recent Labs Lab 09/16/16 0607 09/17/16 0328 09/19/16 0742  WBC 10.5 8.0 8.5  RBC 3.31* 3.07* 3.29*  HGB 9.2* 8.4* 8.9*  HCT 29.0* 27.0* 28.9*  MCV 87.6 87.9 87.8  MCH 27.8 27.4 27.1  MCHC 31.7 31.1 30.8  RDW 16.6* 16.5* 16.6*  PLT 256 212 267    Cardiac Enzymes  Recent Labs Lab 09/15/16 2232 09/16/16 0607 09/16/16 1024 09/16/16 1617  TROPONINI 0.27* 0.24* 0.51* 0.96*    Radiology    Dg Abd 1 View  Result Date: 09/20/2016 CLINICAL DATA:  Lower abdominal pain and constipation. EXAM: ABDOMEN - 1 VIEW COMPARISON:  CT abdomen pelvis from 10/28/2012 FINDINGS: Gaseous distension of the large and small bowel loops are identified. No evidence for high-grade small bowel  obstruction. A moderate stool burden identified within the colon. IMPRESSION: 1. No evidence for high-grade bowel obstruction 2. Gaseous distension of the large and small bowel loops with moderate stool burden. Electronically Signed   By: Signa Kell M.D.   On: 09/20/2016 08:25     Patient Profile     56 year old male with past medical history of coronary artery disease, ischemic cardiomyopathy, prior CVA, hypertension, cocaine abuse, noncompliance admitted with hypertensive emergency, type B aortic dissection who we are asked to evaluate for dissection and hypertensive emergency. Patient presented with complaints of 2-3 days of chest, epigastric and abdominal pain. Initial blood pressure 243/147. Patient had not been taking medications at home. Drug screen positive for cocaine. CTA reveals type B aortic dissection distal to the left subclavian.   Assessment & Plan     1 type B aortic dissection-Pt complains of increased chest and abdominal pain but has been present since admission; femoral and radial pulses remain palable; will need  vascular surgery opinion. BP remains elevated. Continue hydralazine, nitrates and labetalol. Pt volume overloaded. Increase lasix to 80 mg BID and follow renal function. Resume cardene gtt (per nursing, pt will need to be transferred back to unit). Pt remains tenuous.  2 coronary artery disease-begin aspirin 81 mg daily and Lipitor 40 mg daily at discharge.  3 history of ischemic cardiomyopathy-EF 40-45 by echo. Continue labetalol and hydralazine/nitrates. He would not be a candidate for ARB or ACE inhibitor given severity of renal insufficiency.  4 cocaine abuse-previously counseled on discontinuing.  5 noncompliance-I previously explained the importance of compliance with medications.Pt does not seem to understand seriousness of dissection.   6 acute on chronic stage IV kidney disease-volume status worse; change lasix to 80 mg IV BID; nephrology following.  7 hypertensive emergency-blood pressure elevated this AM. Plan as outlined above.  Signed, Olga Millers, MD  09/20/2016, 8:44 AM

## 2016-09-20 NOTE — Progress Notes (Signed)
PT Cancellation Note  Patient Details Name: Austin RivalJames E Simmons MRN: [id number] DOB: 12-Nov-1960   Cancelled Treatment:    Reason Eval/Treat Not Completed: Medical issues which prohibited therapy PT will continue to follow acutely.    Derek MoundKellyn R Kaoir Loree Rupinder Livingston, PTA Pager: [phone]   09/20/2016, 11:44 AM

## 2016-09-20 NOTE — Progress Notes (Addendum)
[facility]  Subjective  - complaints of abdominal pain and continued unchanging chest pain.   Objective (!) 144/81 79 98 F (36.7 C) (Oral) (!) 25 (!) 70%  Intake/Output Summary (Last 24 hours) at 09/20/16 0743 Last data filed at 09/20/16 0300  Gross per 24 hour  Intake              360 ml  Output              700 ml  Net             -340 ml    Palpable PT/DP, B edema min-mod LE Abdomin soft Heart tachy 90's, BP systolic 170's  Assessment/Planning: Acute Type B aortic dissection without mesenteric or extremity malperfusion, CIN  Goal of medical management is to control BP.   No plan for surgical intervention at this time  as long as we can control his BP.  Clinton GallantCOLLINS, EMMA [facility] 09/20/2016 7:43 AM --  Laboratory Lab Results:  Recent Labs  09/19/16 0742  WBC 8.5  HGB 8.9*  HCT 28.9*  PLT 267   BMET  Recent Labs  09/19/16 0742 09/20/16 0241  NA 135 135  K 4.3 4.0  CL 103 104  CO2 21* 22  GLUCOSE 85 89  BUN 48* 50*  CREATININE 4.28* 4.45*  CALCIUM 8.3* 8.4*    COAG Lab Results  Component Value Date   INR 0.96 06/14/2016   INR 0.89 04/07/2014   INR 0.88 06/17/2013   No results found for: PTT    I have interviewed and examined patient with PA and agree with assessment and plan above. Blood pressure remains uncontrolled. Complaining of abdominal discomfort, will get kub.   Necola Bluestein C. Randie Heinzain, MD [facility] [place] Office: [phone] Pager: [phone]

## 2016-09-20 NOTE — Progress Notes (Signed)
CSW met with pt to discuss current drug use (tested positive for cocaine and opiates)  Pt denies cocaine use- states he takes  prescribed opiates but no recreational drugs  Pt denies need for drug rehab resources  CSW signing off  Jorge Ny, [place] Worker [phone]

## 2016-09-20 NOTE — Progress Notes (Signed)
PCCM Progress Note  Admission Date: 09/15/2016  CC: chest pain.  Subjective: BP still high.  Cardiology wants pt on cardene.  Called by [facility] to have PCCM write order.  He is c/o chest pain, abdominal bloating, leg pain/swelling.  Vital signs: BP (!) 169/89 (BP Location: Left Arm)   Pulse (!) 183   Temp 98 F (36.7 C) (Oral)   Resp (!) 22   Ht 5\' 5"  (1.651 m)   Wt 191 lb 8 oz (86.9 kg)   SpO2 95%   BMI 31.87 kg/m   Intake/Output: I/O last 3 completed shifts: In: 510 [P.O.:510] Out: 1025 [Urine:1025]  General - pleasant Eyes - pupils reactive ENT - no sinus tenderness, no oral exudate, no LAN, garbled speech Cardiac - regular, no murmur Chest - no wheeze, rales Abd - soft, non tender Ext - 1+ edema Skin - no rashes Neuro - normal strength Psych - normal mood   CMP Latest Ref Rng & Units 09/20/2016 09/19/2016 09/18/2016  Glucose 65 - 99 mg/dL 89 85 409(W103(H)  BUN 6 - 20 mg/dL 11(B50(H) 14(N48(H) 82(N51(H)  Creatinine 0.61 - 1.24 mg/dL 5.62(Z4.45(H) 3.08(M4.28(H) 5.78(I4.57(H)  Sodium 135 - 145 mmol/L 135 135 133(L)  Potassium 3.5 - 5.1 mmol/L 4.0 4.3 4.0  Chloride 101 - 111 mmol/L 104 103 101  CO2 22 - 32 mmol/L 22 21(L) 21(L)  Calcium 8.9 - 10.3 mg/dL 6.9(G8.4(L) 8.3(L) 8.0(L)  Total Protein 6.5 - 8.1 g/dL - - -  Total Bilirubin 0.3 - 1.2 mg/dL - - -  Alkaline Phos 38 - 126 U/L - - -  AST 15 - 41 U/L - - -  ALT 17 - 63 U/L - - -    CBC Latest Ref Rng & Units 09/19/2016 09/17/2016 09/16/2016  WBC 4.0 - 10.5 K/uL 8.5 8.0 10.5  Hemoglobin 13.0 - 17.0 g/dL 2.9(B8.9(L) 2.8(U8.4(L) 1.3(K9.2(L)  Hematocrit 39.0 - 52.0 % 28.9(L) 27.0(L) 29.0(L)  Platelets 150 - 400 K/uL 267 212 256    CBG (last 3)   Recent Labs  09/19/16 1613 09/19/16 2029 09/20/16 0619  GLUCAP 169* 123* 84     Dg Abd 1 View  Result Date: 09/20/2016 CLINICAL DATA:  Lower abdominal pain and constipation. EXAM: ABDOMEN - 1 VIEW COMPARISON:  CT abdomen pelvis from 10/28/2012 FINDINGS: Gaseous distension of the large and small bowel loops are  identified. No evidence for high-grade small bowel obstruction. A moderate stool burden identified within the colon. IMPRESSION: 1. No evidence for high-grade bowel obstruction 2. Gaseous distension of the large and small bowel loops with moderate stool burden. Electronically Signed   By: Signa Kellaylor  [place] M.D.   On: 09/20/2016 08:25   Studies: CTA 8/27>> acute aortic dissection type B extending from just distal to the left subclavian artery origin to the level of superior mesenteric artery; no extension into the branch vessels of the aorta.  Associated acute intramural hematoma w/in the distal aortic arch and proximal descending thoracic aorta; small left pleural effusion and associated atelectasis.  Cardiomegaly and coronary artery calcification TTE 8/28 > EF 40-45%, G1DD. Renal US 8/28 > chronic renal disease, no hydro.  Assessment/plan:  HTN emergency. Type B aortic dissection. Hx of CAD, ischemic CM with EF 40 to 45%, HLD. - transfer to ICU to write order for cardene - goal SBP < 120  CKD 4. - f/u BMET  Constipation. - bowel regimen  Hx of hypothyroidism. - synthroid  Hx of CVA, panic attacks. - prn ativan  Coralyn HellingVineet Martese Vanatta, MD   Pulmonary/Critical Care 09/20/2016, 10:02 AM Pager:  [phone] After 3pm call: [phone]

## 2016-09-20 NOTE — Progress Notes (Signed)
Report called to Melody RN in 2H.  Patient started on Cardene gtt for BP control.  Monitoring VS q and adjusting dose for SBP <120.  All belongings gathered and transferred with patient.  Will continue to monitor closely

## 2016-09-20 NOTE — Progress Notes (Signed)
Patient ID: Austin RivalJames E Amero, male   DOB: [birth year], 56 y.o.   MRN: [id number] [facility] Progress Note   Assessment/ Plan:   1. Acute kidney injury on chronic kidney disease stage III: This appears to be bi-factorial from recent contrast exposure as well as hemodynamically mediated renal injury with the need for acute blood pressure lowering. Nonoliguric overnight and renal function slightly worse with marginal (likely inaccurate) documented urine output-suspicion for possible extension of dissection into renal arteries. Agree with increased diuretic dose (urine protein/creatinine ratio sub-nephrotic). 2. Type B acute aortic dissection:  Will assist in titration of antihypertensive therapy. No plans for intervention at this time for vascular surgery. 3. Hypertension:  compounded by substance abuse/cocaine use and poor adherence to antihypertensive therapy. Restarting intravenous nicardipine drip-  Being nifedipine XL 90mg  daily 4. Anemia: Continue intravenous iron, no overt loss noted at this time. Consider ESA after blood pressure control.  Subjective:   Awaiting transfer to ICU to restart nicardipine drip for uncontrolled hypertension with associated chest pain/abdominal pain. Complains of sensation of leg swelling and "eyes popping out".    Objective:   BP (!) 177/88   Pulse (!) 183   Temp 98 F (36.7 C) (Oral)   Resp 17   Ht 5\' 5"  (1.651 m)   Wt 86.9 kg (191 lb 8 oz)   SpO2 95%   BMI 31.87 kg/m   Intake/Output Summary (Last 24 hours) at 09/20/16 1059 Last data filed at 09/20/16 0800  Gross per 24 hour  Intake              580 ml  Output              300 ml  Net              280 ml   Weight change:   Physical Exam: Gen: Appears uncomfortable resting in bed  CVS: Pulse regular rhythm, normal rate, S1 and S2 Resp: Clear to auscultation bilaterally, no rales Abd: Soft, obese, nontender Ext: 1-2+ left lower extremity edema, 1+ right lower extremity  edema  Imaging: Dg Abd 1 View  Result Date: 09/20/2016 CLINICAL DATA:  Lower abdominal pain and constipation. EXAM: ABDOMEN - 1 VIEW COMPARISON:  CT abdomen pelvis from 10/28/2012 FINDINGS: Gaseous distension of the large and small bowel loops are identified. No evidence for high-grade small bowel obstruction. A moderate stool burden identified within the colon. IMPRESSION: 1. No evidence for high-grade bowel obstruction 2. Gaseous distension of the large and small bowel loops with moderate stool burden. Electronically Signed   By: Signa Kellaylor  Stroud M.D.   On: 09/20/2016 08:25    Labs: BMET  Recent Labs Lab 09/15/16 2232 09/16/16 0607 09/17/16 0328 09/18/16 0709 09/19/16 0742 09/20/16 0241  NA 128* 135 131* 133* 135 135  K 2.9* 3.0* 3.5 4.0 4.3 4.0  CL 92* 99* 99* 101 103 104  CO2 25 24 22  21* 21* 22  GLUCOSE 125* 119* 122* 103* 85 89  BUN 54* 48* 53* 51* 48* 50*  CREATININE 4.02* 3.57* 4.50* 4.57* 4.28* 4.45*  CALCIUM 8.0* 7.7* 7.7* 8.0* 8.3* 8.4*  PHOS  --  3.2  --  5.1* 5.2*  5.1* 6.0*   CBC  Recent Labs Lab 09/15/16 2232 09/16/16 0607 09/17/16 0328 09/19/16 0742  WBC 12.2* 10.5 8.0 8.5  HGB 9.7* 9.2* 8.4* 8.9*  HCT 31.6* 29.0* 27.0* 28.9*  MCV 88.3 87.6 87.9 87.8  PLT 298 256 212 267   Medications:    .  Chlorhexidine Gluconate Cloth  6 each Topical Q0600  . furosemide  80 mg Intravenous BID  . hydrALAZINE  100 mg Oral TID  . isosorbide mononitrate  60 mg Oral Daily  . labetalol  300 mg Oral BID  . levothyroxine  50 mcg Oral QAC breakfast  . NIFEdipine  90 mg Oral Daily  . pantoprazole  40 mg Oral Daily  . polyethylene glycol  17 g Oral Daily   Zetta Bills, MD 09/20/2016, 10:59 AM

## 2016-09-21 ENCOUNTER — Inpatient Hospital Stay (HOSPITAL_COMMUNITY): Payer: Medicare Other

## 2016-09-21 LAB — COMPREHENSIVE METABOLIC PANEL
ALBUMIN: 2.7 g/dL — AB (ref 3.5–5.0)
ALK PHOS: 55 U/L (ref 38–126)
ALT: 25 U/L (ref 17–63)
AST: 18 U/L (ref 15–41)
Anion gap: 10 (ref 5–15)
BUN: 50 mg/dL — ABNORMAL HIGH (ref 6–20)
CALCIUM: 8.5 mg/dL — AB (ref 8.9–10.3)
CO2: 24 mmol/L (ref 22–32)
CREATININE: 4.47 mg/dL — AB (ref 0.61–1.24)
Chloride: 102 mmol/L (ref 101–111)
GFR calc Af Amer: 16 mL/min — ABNORMAL LOW (ref >60)
GFR calc non Af Amer: 14 mL/min — ABNORMAL LOW (ref >60)
GLUCOSE: 100 mg/dL — AB (ref 65–99)
Potassium: 3.9 mmol/L (ref 3.5–5.1)
SODIUM: 136 mmol/L (ref 135–145)
Total Bilirubin: 0.5 mg/dL (ref 0.3–1.2)
Total Protein: 6.3 g/dL — ABNORMAL LOW (ref 6.5–8.1)

## 2016-09-21 LAB — CBC
HCT: 27.3 % — ABNORMAL LOW (ref 39.0–52.0)
HEMOGLOBIN: 8.5 g/dL — AB (ref 13.0–17.0)
MCH: 26.9 pg (ref 26.0–34.0)
MCHC: 31.1 g/dL (ref 30.0–36.0)
MCV: 86.4 fL (ref 78.0–100.0)
Platelets: 294 10*3/uL (ref 150–400)
RBC: 3.16 MIL/uL — AB (ref 4.22–5.81)
RDW: 16.5 % — ABNORMAL HIGH (ref 11.5–15.5)
WBC: 8 10*3/uL (ref 4.0–10.5)

## 2016-09-21 LAB — LACTIC ACID, PLASMA: Lactic Acid, Venous: 0.6 mmol/L (ref 0.5–1.9)

## 2016-09-21 MED ORDER — LABETALOL HCL 200 MG PO TABS
400.0000 mg | ORAL_TABLET | Freq: Two times a day (BID) | ORAL | Status: DC
  Administered 2016-09-21: 400 mg via ORAL
  Filled 2016-09-21: qty 2

## 2016-09-21 MED ORDER — LORAZEPAM 1 MG PO TABS
1.0000 mg | ORAL_TABLET | Freq: Two times a day (BID) | ORAL | Status: DC
  Administered 2016-09-21 – 2016-09-24 (×8): 1 mg via ORAL
  Filled 2016-09-21 (×8): qty 1

## 2016-09-21 NOTE — Progress Notes (Signed)
Progress Note  Patient Name: Renee RivalJames E Albers Date of Encounter: 09/21/2016  Primary Cardiologist: Dr Anne FuSkains  Subjective   Denies dyspnea; mild epigastric pain; abdominal pain improving  Inpatient Medications    Scheduled Meds: . Chlorhexidine Gluconate Cloth  6 each Topical Q0600  . furosemide  80 mg Intravenous BID  . hydrALAZINE  100 mg Oral TID  . isosorbide mononitrate  60 mg Oral Daily  . labetalol  300 mg Oral BID  . levothyroxine  50 mcg Oral QAC breakfast  . NIFEdipine  90 mg Oral Daily  . pantoprazole  40 mg Oral Daily  . polyethylene glycol  17 g Oral Daily   Continuous Infusions: . sodium chloride    . clevidipine 20 mg/hr (09/21/16 0600)  . ferumoxytol Stopped (09/18/16 0953)   PRN Meds: sodium chloride, alum & mag hydroxide-simeth, bisacodyl, hydrALAZINE, labetalol, LORazepam, oxyCODONE   Vital Signs    Vitals:   09/21/16 0430 09/21/16 0500 09/21/16 0530 09/21/16 0600  BP: 135/77  135/66 129/67  Pulse: 76 80 79 79  Resp: (!) 21 15 (!) 24 19  Temp:      TempSrc:      SpO2: 92% 94% 95% 94%  Weight:  89.9 kg (198 lb 3.1 oz)    Height:        Intake/Output Summary (Last 24 hours) at 09/21/16 0622 Last data filed at 09/21/16 0600  Gross per 24 hour  Intake          2605.76 ml  Output             1450 ml  Net          1155.76 ml   Filed Weights   09/18/16 0338 09/20/16 0548 09/21/16 0500  Weight: 86.7 kg (191 lb 3.2 oz) 86.9 kg (191 lb 8 oz) 89.9 kg (198 lb 3.1 oz)    Telemetry    Sinus- Personally Reviewed    Physical Exam   GEN: WD/obese NAD Neck: Supple; 2+ carotid pulse Cardiac: RRR; 2+ radial and femoral pulses Respiratory: Mild diffuse rhonchi GI: Soft, mildly distended MS: 1-2+ edema Neuro:  no focal findings    Labs    Chemistry  Recent Labs Lab 09/19/16 0742 09/20/16 0241 09/21/16 0334  NA 135 135 136  K 4.3 4.0 3.9  CL 103 104 102  CO2 21* 22 24  GLUCOSE 85 89 100*  BUN 48* 50* 50*  CREATININE 4.28* 4.45*  4.47*  CALCIUM 8.3* 8.4* 8.5*  PROT  --   --  6.3*  ALBUMIN 2.6* 2.6* 2.7*  AST  --   --  18  ALT  --   --  25  ALKPHOS  --   --  55  BILITOT  --   --  0.5  GFRNONAA 14* 14* 14*  GFRAA 17* 16* 16*  ANIONGAP 11 9 10      Hematology  Recent Labs Lab 09/17/16 0328 09/19/16 0742 09/21/16 0334  WBC 8.0 8.5 8.0  RBC 3.07* 3.29* 3.16*  HGB 8.4* 8.9* 8.5*  HCT 27.0* 28.9* 27.3*  MCV 87.9 87.8 86.4  MCH 27.4 27.1 26.9  MCHC 31.1 30.8 31.1  RDW 16.5* 16.6* 16.5*  PLT 212 267 294    Cardiac Enzymes  Recent Labs Lab 09/15/16 2232 09/16/16 0607 09/16/16 1024 09/16/16 1617  TROPONINI 0.27* 0.24* 0.51* 0.96*    Radiology    Dg Abd 1 View  Result Date: 09/20/2016 CLINICAL DATA:  Lower abdominal pain and constipation. EXAM: ABDOMEN -  1 VIEW COMPARISON:  CT abdomen pelvis from 10/28/2012 FINDINGS: Gaseous distension of the large and small bowel loops are identified. No evidence for high-grade small bowel obstruction. A moderate stool burden identified within the colon. IMPRESSION: 1. No evidence for high-grade bowel obstruction 2. Gaseous distension of the large and small bowel loops with moderate stool burden. Electronically Signed   By: Signa Kell M.D.   On: 09/20/2016 08:25     Patient Profile     56 year old male with past medical history of coronary artery disease, ischemic cardiomyopathy, prior CVA, hypertension, cocaine abuse, noncompliance admitted with hypertensive emergency, type B aortic dissection who we are asked to evaluate for dissection and hypertensive emergency. Patient presented with complaints of 2-3 days of chest, epigastric and abdominal pain. Initial blood pressure 243/147. Patient had not been taking medications at home. Drug screen positive for cocaine. CTA reveals type B aortic dissection distal to the left subclavian.   Assessment & Plan     1 type B aortic dissection-Pt's CP and abdominal pain improving; femoral and radial pulses remain palable;  Vascular surgery following. BP remains better. Continue hydralazine, nitrates and labetalol. Pt remains volume overloaded. Continue lasix 80 mg BID and follow renal function. Procardia increased to 90 mg daily; wean clevidipine as tolerated; keep SBP < 120. Pt has little insight into his condition.  2 coronary artery disease-begin aspirin 81 mg daily and Lipitor 40 mg daily at discharge.  3 history of ischemic cardiomyopathy-EF 40-45 by echo. Continue labetalol and hydralazine/nitrates. He would not be a candidate for ARB or ACE inhibitor given severity of renal insufficiency.  4 cocaine abuse-previously counseled on discontinuing.  5 noncompliance-I previously explained the importance of compliance with medications.  6 acute on chronic stage IV kidney disease-pt remains volume overloaded; continue lasix 80 mg IV BID; nephrology following.  7 hypertensive emergency-blood pressure improved this AM. Plan as outlined above.  Signed, Olga Millers, MD  09/21/2016, 6:22 AM

## 2016-09-21 NOTE — Progress Notes (Signed)
  Progress Note    09/21/2016 9:35 AM * No surgery found *  Subjective:  Wants to eat, wants to leave  Vitals:   09/21/16 0730 09/21/16 0800  BP: (!) 142/69 127/75  Pulse: 69 72  Resp: (!) 21 (!) 22  Temp:    SpO2:  90%    Physical Exam: aaox3 Abdomen minimally distended but soft Palpable pt bilaterally  CBC    Component Value Date/Time   WBC 8.0 09/21/2016 0334   RBC 3.16 (L) 09/21/2016 0334   HGB 8.5 (L) 09/21/2016 0334   HCT 27.3 (L) 09/21/2016 0334   PLT 294 09/21/2016 0334   MCV 86.4 09/21/2016 0334   MCH 26.9 09/21/2016 0334   MCHC 31.1 09/21/2016 0334   RDW 16.5 (H) 09/21/2016 0334   LYMPHSABS 0.8 06/14/2016 1147   MONOABS 0.4 06/14/2016 1147   EOSABS 0.2 06/14/2016 1147   BASOSABS 0.0 06/14/2016 1147    BMET    Component Value Date/Time   NA 136 09/21/2016 0334   K 3.9 09/21/2016 0334   CL 102 09/21/2016 0334   CO2 24 09/21/2016 0334   GLUCOSE 100 (H) 09/21/2016 0334   BUN 50 (H) 09/21/2016 0334   CREATININE 4.47 (H) 09/21/2016 0334   CREATININE 1.25 10/25/2013 1512   CALCIUM 8.5 (L) 09/21/2016 0334   GFRNONAA 14 (L) 09/21/2016 0334   GFRNONAA 66 10/25/2013 1512   GFRAA 16 (L) 09/21/2016 0334   GFRAA 77 10/25/2013 1512    INR    Component Value Date/Time   INR 0.96 06/14/2016 1147     Intake/Output Summary (Last 24 hours) at 09/21/16 0935 Last data filed at 09/21/16 0800  Gross per 24 hour  Intake          2398.23 ml  Output             1450 ml  Net           948.23 ml     Assessment:  56 y.o. male is here with acute tbad Plan: Npo now for mesenteric and renal duplexes this a.m. Needs better bp control prior to discharge No need for intervention at this time without clinical evidence of malperfusion    Randee Upchurch C. Randie Heinzain, MD [facility] Office: [phone] Pager: [phone]  09/21/2016 9:35 AM

## 2016-09-21 NOTE — Progress Notes (Signed)
Vascular study not available til Tuesday. Tammy SoursAngela Ritha Sampedro

## 2016-09-21 NOTE — Progress Notes (Signed)
[facility]  09/15/2016 admitted  CC: chest pain Primary problem: type B dissection, in ICU for IV BP management  S: Sleeping, no acute events  O: Vitals:   09/21/16 0645 09/21/16 0700 09/21/16 0725 09/21/16 0730  BP: 132/76 133/70  (!) 142/69  Pulse: 78 78  69  Resp: (!) 24 19  (!) 21  Temp:   98.1 F (36.7 C)   TempSrc:   Oral   SpO2: 93% 94%  (!) 89%  Weight:      Height:       General:  Resting comfortably in bed HENT: NCAT OP clear PULM: CTA B, normal effort CV: RRR, no mgr GI: BS+, soft, nontender MSK: normal bulk and tone Neuro: awake, alert, no distress, MAEW    CBC    Component Value Date/Time   WBC 8.0 09/21/2016 0334   RBC 3.16 (L) 09/21/2016 0334   HGB 8.5 (L) 09/21/2016 0334   HCT 27.3 (L) 09/21/2016 0334   PLT 294 09/21/2016 0334   MCV 86.4 09/21/2016 0334   MCH 26.9 09/21/2016 0334   MCHC 31.1 09/21/2016 0334   RDW 16.5 (H) 09/21/2016 0334   LYMPHSABS 0.8 06/14/2016 1147   MONOABS 0.4 06/14/2016 1147   EOSABS 0.2 06/14/2016 1147   BASOSABS 0.0 06/14/2016 1147   BMET    Component Value Date/Time   NA 136 09/21/2016 0334   K 3.9 09/21/2016 0334   CL 102 09/21/2016 0334   CO2 24 09/21/2016 0334   GLUCOSE 100 (H) 09/21/2016 0334   BUN 50 (H) 09/21/2016 0334   CREATININE 4.47 (H) 09/21/2016 0334   CREATININE 1.25 10/25/2013 1512   CALCIUM 8.5 (L) 09/21/2016 0334   GFRNONAA 14 (L) 09/21/2016 0334   GFRNONAA 66 10/25/2013 1512   GFRAA 16 (L) 09/21/2016 0334   GFRAA 77 10/25/2013 1512    Impression/plan:  Hypertensive emergency: blood pressure medications titrated per cardiology and renal; on cleviprex so PCCM following;   [facility]  Heber CarolinaBrent McQuaid, MD [place] [facility] Pager: [phone] Cell: [phone] After 3pm or if no response, call [phone]

## 2016-09-21 NOTE — Progress Notes (Signed)
Patient ID: Austin Simmons, male   DOB: [birth year], 56 y.o.   MRN: [id number] [facility] Progress Note   Assessment/ Plan:   1. Acute kidney injury on chronic kidney disease stage III:  Secondary to contrast nephropathy as well as acute blood pressure lowering required for management of dissecting aortic aneurysm. He continues to have good urine output with seemingly stable renal function and no acute dialysis needs noted at this time. With renal function plateauing, low index of suspicion that he has extended aortic dissection into renal arteries. 2. Type B acute aortic dissection:  continue efforts at titration of antibiotic therapy with the goal of getting him off of Cardene drip. No plans for intervention at this time for vascular surgery. 3. Hypertension:  compounded by substance abuse/cocaine use and poor adherence to antihypertensive therapy. Because heart rate appears to be permissive-increase labetalol to 400 mg twice a day. 4. Anemia: Continue intravenous iron, no overt loss noted at this time. Consider ESA after blood pressure control.  Subjective:   Frustrated that he is still in the hospital and inquires about discharge plans. Epigastric pain improving without any shortness of breath.    Objective:   BP 127/75   Pulse 72   Temp 98.1 F (36.7 C) (Oral)   Resp (!) 22   Ht 5\' 5"  (1.651 m)   Wt 89.9 kg (198 lb 3.1 oz)   SpO2 90%   BMI 32.98 kg/m   Intake/Output Summary (Last 24 hours) at 09/21/16 [id number] Last data filed at 09/21/16 0800  Gross per 24 hour  Intake          2398.23 ml  Output             1450 ml  Net           948.23 ml   Weight change: 3.036 kg (6 lb 11.1 oz)  Physical Exam: Gen: Appears relatively comfortable resting in bed CVS: Pulse regular rhythm, normal rate, S1 and S2 Resp: Clear to auscultation bilaterally, no rales Abd: Soft, obese, nontender Ext: 1-2+ left lower extremity edema, 1+ right lower extremity edema  Imaging: Dg Abd 1  View  Result Date: 09/20/2016 CLINICAL DATA:  Lower abdominal pain and constipation. EXAM: ABDOMEN - 1 VIEW COMPARISON:  CT abdomen pelvis from 10/28/2012 FINDINGS: Gaseous distension of the large and small bowel loops are identified. No evidence for high-grade small bowel obstruction. A moderate stool burden identified within the colon. IMPRESSION: 1. No evidence for high-grade bowel obstruction 2. Gaseous distension of the large and small bowel loops with moderate stool burden. Electronically Signed   By: Signa Kellaylor  Stroud M.D.   On: 09/20/2016 08:25    Labs: BMET  Recent Labs Lab 09/15/16 2232 09/16/16 [id number] 09/17/16 0328 09/18/16 0709 09/19/16 0742 09/20/16 0241 09/21/16 0334  NA 128* 135 131* 133* 135 135 136  K 2.9* 3.0* 3.5 4.0 4.3 4.0 3.9  CL 92* 99* 99* 101 103 104 102  CO2 25 24 22  21* 21* 22 24  GLUCOSE 125* 119* 122* 103* 85 89 100*  BUN 54* 48* 53* 51* 48* 50* 50*  CREATININE 4.02* 3.57* 4.50* 4.57* 4.28* 4.45* 4.47*  CALCIUM 8.0* 7.7* 7.7* 8.0* 8.3* 8.4* 8.5*  PHOS  --  3.2  --  5.1* 5.2*  5.1* 6.0*  --    CBC  Recent Labs Lab 09/16/16 0607 09/17/16 0328 09/19/16 0742 09/21/16 0334  WBC 10.5 8.0 8.5 8.0  HGB 9.2* 8.4* 8.9* 8.5*  HCT 29.0*  27.0* 28.9* 27.3*  MCV 87.6 87.9 87.8 86.4  PLT 256 212 267 294   Medications:    . Chlorhexidine Gluconate Cloth  6 each Topical Q0600  . furosemide  80 mg Intravenous BID  . hydrALAZINE  100 mg Oral TID  . isosorbide mononitrate  60 mg Oral Daily  . labetalol  300 mg Oral BID  . levothyroxine  50 mcg Oral QAC breakfast  . NIFEdipine  90 mg Oral Daily  . pantoprazole  40 mg Oral Daily  . polyethylene glycol  17 g Oral Daily   Zetta Bills, MD 09/21/2016, 9:06 AM

## 2016-09-21 NOTE — Progress Notes (Signed)
  Subjective:  Complains of bloating and upper abdominal pain. Passing some flatus. Ate a little.  Says he is going home tomorrow.  Objective: Vital signs in last 24 hours: Temp:  [97.4 F (36.3 C)-98.3 F (36.8 C)] 98.1 F (36.7 C) (09/01 0725) Pulse Rate:  [69-94] 78 (09/01 0900) Cardiac Rhythm: Normal sinus rhythm (09/01 0900) Resp:  [13-30] 21 (09/01 0900) BP: (96-181)/(53-92) 143/82 (09/01 0900) SpO2:  [90 %-100 %] 94 % (09/01 0900) Weight:  [89.9 kg (198 lb 3.1 oz)] 89.9 kg (198 lb 3.1 oz) (09/01 0500)  Hemodynamic parameters for last 24 hours:    Intake/Output from previous day: 08/31 0701 - 09/01 0700 In: 2645.8 [P.O.:1320; I.V.:1295.8] Out: 1450 [Urine:1450] Intake/Output this shift: Total I/O In: 322.5 [P.O.:240; I.V.:82.5] Out: -   General appearance: alert and restless, eating breakfast Heart: regular rate and rhythm, S1, S2 normal, no murmur, click, rub or gallop Lungs: clear to auscultation bilaterally Abdomen: soft, distended mildly-tender; bowel sounds active Extremities: warm with palpable pedal pulses bilat  Lab Results:  Recent Labs  09/19/16 0742 09/21/16 0334  WBC 8.5 8.0  HGB 8.9* 8.5*  HCT 28.9* 27.3*  PLT 267 294   BMET:  Recent Labs  09/20/16 0241 09/21/16 0334  NA 135 136  K 4.0 3.9  CL 104 102  CO2 22 24  GLUCOSE 89 100*  BUN 50* 50*  CREATININE 4.45* 4.47*  CALCIUM 8.4* 8.5*    PT/INR: No results for input(s): LABPROT, INR in the last 72 hours. ABG    Component Value Date/Time   TCO2 23 06/14/2016 1205   CBG (last 3)   Recent Labs  09/19/16 1613 09/19/16 2029 09/20/16 0619  GLUCAP 169* 123* 84    Assessment/Plan:  Acute type B aortic dissection. His BP is still labile on Cleviprex. Good BP control very important for preventing complications of dissection.  He probably has some ileus but active BS and KUB shows improving gaseous bowel distension. He is shoveling food in like its his last meal. I doubt that  he has significant mesenteric ischemia with active BS. Ileus may be due to pain meds.  Acute on chronic renal failure: per nephrology. Avoid contrast. Dissection did not involve renal arteries on initial CTA.  I spent 15 minutes during the conduct of this hospital encounter and >50% of this time involved direct face-to-face encounter with the patient for counseling and/or coordination of their care.     LOS: 5 days    Alleen BorneBryan K Bartle 09/21/2016

## 2016-09-21 NOTE — Progress Notes (Signed)
PROGRESS NOTE    Austin Simmons  XBJ:[id number] DOB: 04/29/1960 DOA: 09/15/2016 PCP: System, Pcp Not In    Brief Narrative:  56 year old male who presented with chest pain. Patient is known to have ischemic cardiomyopathy, history of CVA and cocaine abuse. He presented with precordial chest pain, 9 out of 10 in intensity radiated into his abdomen, for last 2-3 days prior to hospitalization. On his initial physical examination blood pressure was 203/88, heart rate 103, temperature is 97.7, respiratory rate 25, oxygenation saturation 96%. Patient was awake and alert, his lungs were noted to auscultation bilaterally, heart S1-S2 present and rhythmic, abdomen was protuberant and distended. Positive extremity edema 2+. Patient was found to have a type B aortic dissection.   Patient was admitted to the intensive care unit with a working diagnosis of hypertensive emergency complicated by a type B aortic dissection.    Assessment & Plan:   Principal Problem:   Descending thoracic aortic dissection (HCC) Active Problems:   Cocaine abuse   Hypertension   Ischemic cardiomyopathy   Hypertensive emergency   AKI (acute kidney injury) (HCC)   CKD (chronic kidney disease), stage III   Pressure injury of skin   Hypertensive cardiomyopathy (HCC)   H/O noncompliance with medical treatment, presenting hazards to health   Acute renal failure superimposed on chronic kidney disease (HCC)   1. Hypertensive emergency complicated by type B aortic dissection. Will continue to target systolic blood pressure of 120, will continue vasodilator therapy and b blocker. Labetalol increased to 400 mg bid, continue high doses of hydralazine,  Isosorbide and nifedipine. Patient tested positive for cocaine on admission, has been tolerating b blocker so far, will add twice daily lorazepam, and continue to wean off clevidipne per protocol.   2. Acute kidney injury on chronic kidney disease stage III. Urine output 1,450 ml  over last 24 hours, patient on high doses of furosemide, renal function with serum cr at 4,47 stable from 4,45, serum K at 3,9 and bicarbonate at 24.   3. Cocaine abuse. Patient tested positive for cocaine on admission, started on benzodiazepines.   4. Hypothyroidism. Continue levothyroxine, per home regimen.   5. Dyspepsia. Continue maalox, for antiacid therapy. Abdomen is distended but no signs of obstruction.    DVT prophylaxis: scd  Code Status: Full  Family Communication:  Disposition Plan:    Consultants:   Cardiology   Nephrology   Procedures:     Antimicrobials:       Subjective: Patient transferred to the ICU for tight blood pressure control, this am with no chest pain or dyspnea. No nausea or vomiting. Positive for dyspepsia, moderate in intensity, no worsening factors, improved with antiacids, associated with abdominal distention. Positive bowel movement per patient's report.   Objective: Vitals:   09/21/16 0700 09/21/16 0725 09/21/16 0730 09/21/16 0800  BP: 133/70  (!) 142/69 127/75  Pulse: 78  69 72  Resp: 19  (!) 21 (!) 22  Temp:  98.1 F (36.7 C)    TempSrc:  Oral    SpO2: 94%   90%  Weight:      Height:        Intake/Output Summary (Last 24 hours) at 09/21/16 0932 Last data filed at 09/21/16 0800  Gross per 24 hour  Intake          2398.23 ml  Output             1450 ml  Net  948.23 ml   Filed Weights   09/18/16 0338 09/20/16 0548 09/21/16 0500  Weight: 86.7 kg (191 lb 3.2 oz) 86.9 kg (191 lb 8 oz) 89.9 kg (198 lb 3.1 oz)    Examination:  General: deconditioned Neurology: Awake and alert, non focal  E ENT: mild pallor, no icterus, oral mucosa moist Cardiovascular: S1-S2 present, rhythmic, no gallops, rubs, or murmurs. No jugular venous distention, ++ pitting lower extremity edema. Pulmonary: vesicular breath sounds bilaterally, adequate air movement, no wheezing, rhonchi or rales. Gastrointestinal. Abdomen  distended and timpanic, no organomegaly, non tender, no rebound or guarding Skin. No rashes Musculoskeletal: no joint deformities     Data Reviewed: I have personally reviewed following labs and imaging studies  CBC:  Recent Labs Lab 09/15/16 2232 09/16/16 0607 09/17/16 0328 09/19/16 0742 09/21/16 0334  WBC 12.2* 10.5 8.0 8.5 8.0  HGB 9.7* 9.2* 8.4* 8.9* 8.5*  HCT 31.6* 29.0* 27.0* 28.9* 27.3*  MCV 88.3 87.6 87.9 87.8 86.4  PLT 298 256 212 267 294   Basic Metabolic Panel:  Recent Labs Lab 09/16/16 0607 09/17/16 0328 09/17/16 1023 09/18/16 0709 09/19/16 0742 09/20/16 0241 09/21/16 0334  NA 135 131*  --  133* 135 135 136  K 3.0* 3.5  --  4.0 4.3 4.0 3.9  CL 99* 99*  --  101 103 104 102  CO2 24 22  --  21* 21* 22 24  GLUCOSE 119* 122*  --  103* 85 89 100*  BUN 48* 53*  --  51* 48* 50* 50*  CREATININE 3.57* 4.50*  --  4.57* 4.28* 4.45* 4.47*  CALCIUM 7.7* 7.7*  --  8.0* 8.3* 8.4* 8.5*  MG 1.7  --  1.9  --  2.1  --   --   PHOS 3.2  --   --  5.1* 5.2*  5.1* 6.0*  --    GFR: Estimated Creatinine Clearance: 19.7 mL/min (A) (by C-G formula based on SCr of 4.47 mg/dL (H)). Liver Function Tests:  Recent Labs Lab 09/18/16 0709 09/19/16 0742 09/20/16 0241 09/21/16 0334  AST  --   --   --  18  ALT  --   --   --  25  ALKPHOS  --   --   --  55  BILITOT  --   --   --  0.5  PROT  --   --   --  6.3*  ALBUMIN 2.6* 2.6* 2.6* 2.7*   No results for input(s): LIPASE, AMYLASE in the last 168 hours. No results for input(s): AMMONIA in the last 168 hours. Coagulation Profile: No results for input(s): INR, PROTIME in the last 168 hours. Cardiac Enzymes:  Recent Labs Lab 09/15/16 2232 09/16/16 0607 09/16/16 1024 09/16/16 1617  TROPONINI 0.27* 0.24* 0.51* 0.96*   BNP (last 3 results) No results for input(s): PROBNP in the last 8760 hours. HbA1C: No results for input(s): HGBA1C in the last 72 hours. CBG:  Recent Labs Lab 09/19/16 1613 09/19/16 2029  09/20/16 0619  GLUCAP 169* 123* 84   Lipid Profile: No results for input(s): CHOL, HDL, LDLCALC, TRIG, CHOLHDL, LDLDIRECT in the last 72 hours. Thyroid Function Tests: No results for input(s): TSH, T4TOTAL, FREET4, T3FREE, THYROIDAB in the last 72 hours. Anemia Panel: No results for input(s): VITAMINB12, FOLATE, FERRITIN, TIBC, IRON, RETICCTPCT in the last 72 hours.    Radiology Studies: I have reviewed all of the imaging during this hospital visit personally     Scheduled Meds: . Chlorhexidine Gluconate Cloth  6 each Topical Q0600  . furosemide  80 mg Intravenous BID  . hydrALAZINE  100 mg Oral TID  . isosorbide mononitrate  60 mg Oral Daily  . labetalol  400 mg Oral BID  . levothyroxine  50 mcg Oral QAC breakfast  . NIFEdipine  90 mg Oral Daily  . pantoprazole  40 mg Oral Daily  . polyethylene glycol  17 g Oral Daily   Continuous Infusions: . sodium chloride    . clevidipine 21 mg/hr (09/21/16 0712)  . ferumoxytol Stopped (09/18/16 1610)     LOS: 5 days     Arnie Maiolo Annett Gula, MD [facility] Pager [phone]

## 2016-09-22 DIAGNOSIS — K219 Gastro-esophageal reflux disease without esophagitis: Secondary | ICD-10-CM

## 2016-09-22 DIAGNOSIS — I7102 Dissection of abdominal aorta: Secondary | ICD-10-CM

## 2016-09-22 LAB — BASIC METABOLIC PANEL
ANION GAP: 12 (ref 5–15)
BUN: 54 mg/dL — ABNORMAL HIGH (ref 6–20)
CALCIUM: 8.3 mg/dL — AB (ref 8.9–10.3)
CO2: 22 mmol/L (ref 22–32)
Chloride: 100 mmol/L — ABNORMAL LOW (ref 101–111)
Creatinine, Ser: 4.81 mg/dL — ABNORMAL HIGH (ref 0.61–1.24)
GFR calc Af Amer: 15 mL/min — ABNORMAL LOW (ref >60)
GFR calc non Af Amer: 13 mL/min — ABNORMAL LOW (ref >60)
GLUCOSE: 105 mg/dL — AB (ref 65–99)
Potassium: 4.4 mmol/L (ref 3.5–5.1)
Sodium: 134 mmol/L — ABNORMAL LOW (ref 135–145)

## 2016-09-22 MED ORDER — LABETALOL HCL 300 MG PO TABS
600.0000 mg | ORAL_TABLET | Freq: Two times a day (BID) | ORAL | Status: DC
  Administered 2016-09-22 – 2016-09-23 (×2): 600 mg via ORAL
  Filled 2016-09-22 (×2): qty 2

## 2016-09-22 MED ORDER — LABETALOL HCL 300 MG PO TABS
500.0000 mg | ORAL_TABLET | Freq: Two times a day (BID) | ORAL | Status: DC
  Administered 2016-09-22: 500 mg via ORAL
  Filled 2016-09-22: qty 1

## 2016-09-22 MED ORDER — SPIRONOLACTONE 25 MG PO TABS
25.0000 mg | ORAL_TABLET | Freq: Every day | ORAL | Status: DC
  Administered 2016-09-22 – 2016-09-24 (×3): 25 mg via ORAL
  Filled 2016-09-22 (×3): qty 1

## 2016-09-22 NOTE — Progress Notes (Signed)
Progress Note  Patient Name: Austin Simmons Date of Encounter: 09/22/2016  Primary Cardiologist: Dr Anne FuSkains  Subjective   No chest pain or dyspnea; mild abdominal pain  Inpatient Medications    Scheduled Meds: . furosemide  80 mg Intravenous BID  . hydrALAZINE  100 mg Oral TID  . isosorbide mononitrate  60 mg Oral Daily  . labetalol  400 mg Oral BID  . levothyroxine  50 mcg Oral QAC breakfast  . LORazepam  1 mg Oral BID  . NIFEdipine  90 mg Oral Daily  . pantoprazole  40 mg Oral Daily  . polyethylene glycol  17 g Oral Daily   Continuous Infusions: . sodium chloride    . clevidipine 20 mg/hr (09/22/16 0703)   PRN Meds: sodium chloride, alum & mag hydroxide-simeth, bisacodyl, hydrALAZINE, labetalol, LORazepam, oxyCODONE   Vital Signs    Vitals:   09/22/16 0400 09/22/16 0500 09/22/16 0600 09/22/16 0700  BP: 118/65 127/73 (!) 148/71 (!) 127/56  Pulse: 72 91    Resp: 18 (!) 23 (!) 22 (!) 21  Temp:      TempSrc:      SpO2: 91%     Weight:  90.6 kg (199 lb 11.8 oz)    Height:        Intake/Output Summary (Last 24 hours) at 09/22/16 0724 Last data filed at 09/22/16 0700  Gross per 24 hour  Intake           2322.6 ml  Output             1450 ml  Net            872.6 ml   Filed Weights   09/20/16 0548 09/21/16 0500 09/22/16 0500  Weight: 86.9 kg (191 lb 8 oz) 89.9 kg (198 lb 3.1 oz) 90.6 kg (199 lb 11.8 oz)    Telemetry    Sinus- Personally Reviewed    Physical Exam   GEN: WD/obese NAD; resting comfortably in bed Neck: Supple; 2+ carotid pulse, + JVD Cardiac: RRR; no murmur; 2+ radial and femoral pulses Respiratory: CTA GI: Soft, mildly distended, not tender MS: 1+ edema Neuro:  grossly intact    Labs    Chemistry  Recent Labs Lab 09/19/16 0742 09/20/16 0241 09/21/16 0334 09/22/16 0332  NA 135 135 136 134*  K 4.3 4.0 3.9 4.4  CL 103 104 102 100*  CO2 21* 22 24 22   GLUCOSE 85 89 100* 105*  BUN 48* 50* 50* 54*  CREATININE 4.28* 4.45*  4.47* 4.81*  CALCIUM 8.3* 8.4* 8.5* 8.3*  PROT  --   --  6.3*  --   ALBUMIN 2.6* 2.6* 2.7*  --   AST  --   --  18  --   ALT  --   --  25  --   ALKPHOS  --   --  55  --   BILITOT  --   --  0.5  --   GFRNONAA 14* 14* 14* 13*  GFRAA 17* 16* 16* 15*  ANIONGAP 11 9 10 12      Hematology  Recent Labs Lab 09/17/16 0328 09/19/16 0742 09/21/16 0334  WBC 8.0 8.5 8.0  RBC 3.07* 3.29* 3.16*  HGB 8.4* 8.9* 8.5*  HCT 27.0* 28.9* 27.3*  MCV 87.9 87.8 86.4  MCH 27.4 27.1 26.9  MCHC 31.1 30.8 31.1  RDW 16.5* 16.6* 16.5*  PLT 212 267 294    Cardiac Enzymes  Recent Labs Lab 09/15/16 2232 09/16/16  1610 09/16/16 1024 09/16/16 1617  TROPONINI 0.27* 0.24* 0.51* 0.96*    Radiology    Dg Abd 1 View  Result Date: 09/20/2016 CLINICAL DATA:  Lower abdominal pain and constipation. EXAM: ABDOMEN - 1 VIEW COMPARISON:  CT abdomen pelvis from 10/28/2012 FINDINGS: Gaseous distension of the large and small bowel loops are identified. No evidence for high-grade small bowel obstruction. A moderate stool burden identified within the colon. IMPRESSION: 1. No evidence for high-grade bowel obstruction 2. Gaseous distension of the large and small bowel loops with moderate stool burden. Electronically Signed   By: Signa Kell M.D.   On: 09/20/2016 08:25   Dg Chest Port 1 View  Result Date: 09/21/2016 CLINICAL DATA:  Pulmonary edema. EXAM: PORTABLE CHEST 1 VIEW COMPARISON:  Two-view chest x-ray 09/15/2016. CT of the chest 09/16/2016. FINDINGS: Cardiac enlargement is stable. There is no edema or significant airspace disease. The visualized soft tissues and bony thorax are unremarkable. IMPRESSION: 1. Stable cardiomegaly without failure. Electronically Signed   By: Marin Muscarella M.D.   On: 09/21/2016 09:05   Dg Abd Portable 1v  Result Date: 09/21/2016 CLINICAL DATA:  Hypertensive emergency, type B dissection, abdominal pain EXAM: PORTABLE ABDOMEN - 1 VIEW COMPARISON:  09/20/2016 FINDINGS: Cardiomegaly  evident. Clear lung bases. No basilar pleural effusion. Slight improvement in gaseous distention of small and large bowel. No evidence of high-grade obstruction. Air in the rectum. No significant abnormal calcification or osseous finding. IMPRESSION: Improving gaseous distention of small and large bowel. Appearance remains nonspecific. No high-grade obstruction. Electronically Signed   By: Judie Petit.  Shick M.D.   On: 09/21/2016 09:12     Patient Profile     56 year old male with past medical history of coronary artery disease, ischemic cardiomyopathy, prior CVA, hypertension, cocaine abuse, noncompliance admitted with hypertensive emergency, type B aortic dissection who we are asked to evaluate for dissection and hypertensive emergency. Patient presented with complaints of 2-3 days of chest, epigastric and abdominal pain. Initial blood pressure 243/147. Patient had not been taking medications at home. Drug screen positive for cocaine. CTA reveals type B aortic dissection distal to the left subclavian.   Assessment & Plan     1 type B aortic dissection-femoral and radial pulses remain palable; Vascular surgery following. BP controlled but remains on clevidipine. Continue hydralazine, nitrates and labetalol (increase to 500 mg BID). Continue lasix 80 mg BID and follow renal function. Continue procardia xl 90 mg daily; wean clevidipine as tolerated; keep SBP < 120.   2 coronary artery disease-begin aspirin 81 mg daily and Lipitor 40 mg daily at discharge.  3 history of ischemic cardiomyopathy-EF 40-45 by echo. Continue labetalol and hydralazine/nitrates. He would not be a candidate for ARB or ACE inhibitor given severity of renal insufficiency.  4 cocaine abuse-previously counseled on discontinuing.  5 noncompliance-I previously explained the importance of compliance with medications.  6 acute on chronic stage IV kidney disease-pt remains volume overloaded but improving; continue lasix 80 mg IV BID;  recheck BMET in AM; nephrology following.  7 hypertensive emergency-blood pressure difficult to control. Plan as outlined above.  Signed, Olga Millers, MD  09/22/2016, 7:24 AM

## 2016-09-22 NOTE — Progress Notes (Addendum)
Patient ID: Austin RivalJames E Dillenburg, male   DOB: [birth year], 56 y.o.   MRN: [id number] [facility] Progress Note   Assessment/ Plan:   1. Acute kidney injury on chronic kidney disease stage III:  Secondary to contrast nephropathy as well as acute blood pressure lowering required for management of dissecting aortic aneurysm. Worsening renal function noted however without any acute electrolyte abnormality/acute dialysis needs-continue to monitor and anticipate he would have a new established renal baseline after renal recovery. 2. Type B acute aortic dissection:  continue efforts at titration of antihypertensive therapy with the goal of getting him off of Clevidipine drip. No plans for intervention at this time for vascular surgery. 3. Hypertension:  compounded by substance abuse/cocaine use and poor adherence to antihypertensive therapy. Increase labetalol to 600 mg 3 times a day and start Aldactone 25 mg daily to augment blood pressure control/diuresis. 4. Anemia: Continue intravenous iron, no overt loss noted at this time. Will consider ESA after blood pressure control is streamlined. 5. Hyponatremia: Secondary to free water excretion deficit with acute kidney injury-begin free water restriction with ongoing loop diuretics.  Subjective:   Reports to be feeling cold and shivering. Denies any chest pain and has minimal epigastric pain. Wants to try and get out of the hospital before his birthday.    Objective:   BP (!) 127/56   Pulse 91   Temp (!) 97.5 F (36.4 C) (Axillary)   Resp (!) 21   Ht 5\' 5"  (1.651 m)   Wt 90.6 kg (199 lb 11.8 oz)   SpO2 91%   BMI 33.24 kg/m   Intake/Output Summary (Last 24 hours) at 09/22/16 0902 Last data filed at 09/22/16 0700  Gross per 24 hour  Intake          2240.13 ml  Output             1250 ml  Net           990.13 ml   Weight change: 0.7 kg (1 lb 8.7 oz)  Physical Exam: Gen: Appears comfortable resting up in his recliner CVS: Pulse regular  rhythm, normal rate, S1 and S2 Resp: Clear to auscultation bilaterally, no rales Abd: Soft, obese, nontender Ext: Asymmetric 1+ lower extremity edema left >right  Imaging: Dg Chest Port 1 View  Result Date: 09/21/2016 CLINICAL DATA:  Pulmonary edema. EXAM: PORTABLE CHEST 1 VIEW COMPARISON:  Two-view chest x-ray 09/15/2016. CT of the chest 09/16/2016. FINDINGS: Cardiac enlargement is stable. There is no edema or significant airspace disease. The visualized soft tissues and bony thorax are unremarkable. IMPRESSION: 1. Stable cardiomegaly without failure. Electronically Signed   By: Marin Robertshristopher  Mattern M.D.   On: 09/21/2016 09:05   Dg Abd Portable 1v  Result Date: 09/21/2016 CLINICAL DATA:  Hypertensive emergency, type B dissection, abdominal pain EXAM: PORTABLE ABDOMEN - 1 VIEW COMPARISON:  09/20/2016 FINDINGS: Cardiomegaly evident. Clear lung bases. No basilar pleural effusion. Slight improvement in gaseous distention of small and large bowel. No evidence of high-grade obstruction. Air in the rectum. No significant abnormal calcification or osseous finding. IMPRESSION: Improving gaseous distention of small and large bowel. Appearance remains nonspecific. No high-grade obstruction. Electronically Signed   By: Judie PetitM.  Shick M.D.   On: 09/21/2016 09:12    Labs: BMET  Recent Labs Lab 09/16/16 0607 09/17/16 0328 09/18/16 0709 09/19/16 0742 09/20/16 0241 09/21/16 0334 09/22/16 0332  NA 135 131* 133* 135 135 136 134*  K 3.0* 3.5 4.0 4.3 4.0 3.9 4.4  CL  99* 99* 101 103 104 102 100*  CO2 24 22 21* 21* 22 24 22   GLUCOSE 119* 122* 103* 85 89 100* 105*  BUN 48* 53* 51* 48* 50* 50* 54*  CREATININE 3.57* 4.50* 4.57* 4.28* 4.45* 4.47* 4.81*  CALCIUM 7.7* 7.7* 8.0* 8.3* 8.4* 8.5* 8.3*  PHOS 3.2  --  5.1* 5.2*  5.1* 6.0*  --   --    CBC  Recent Labs Lab 09/16/16 0607 09/17/16 0328 09/19/16 0742 09/21/16 0334  WBC 10.5 8.0 8.5 8.0  HGB 9.2* 8.4* 8.9* 8.5*  HCT 29.0* 27.0* 28.9* 27.3*  MCV  87.6 87.9 87.8 86.4  PLT 256 212 267 294   Medications:    . furosemide  80 mg Intravenous BID  . hydrALAZINE  100 mg Oral TID  . isosorbide mononitrate  60 mg Oral Daily  . labetalol  500 mg Oral BID  . levothyroxine  50 mcg Oral QAC breakfast  . LORazepam  1 mg Oral BID  . NIFEdipine  90 mg Oral Daily  . pantoprazole  40 mg Oral Daily  . polyethylene glycol  17 g Oral Daily   Zetta Bills, MD 09/22/2016, 9:02 AM

## 2016-09-22 NOTE — Progress Notes (Signed)
[facility] PCCM  Chart reviewed Cardiology, Cariovascular surgery, Nephrology, [facility], and Vascular surgery all following.  PCCM will be available as needed.  Heber CarolinaBrent McQuaid, MD [facility] Pager: [phone] Cell: [phone] After 3pm or if no response, call [phone]

## 2016-09-22 NOTE — Progress Notes (Signed)
  Progress Note    09/22/2016 10:33 AM * No surgery found *  Subjective:  No new complaints  Vitals:   09/22/16 0800 09/22/16 0906  BP:  (!) 155/72  Pulse:    Resp:  17  Temp:    SpO2: 98%     Physical Exam: Awake and alert Abdomen is soft and minimally distended 2+ pt bilaterally  CBC    Component Value Date/Time   WBC 8.0 09/21/2016 0334   RBC 3.16 (L) 09/21/2016 0334   HGB 8.5 (L) 09/21/2016 0334   HCT 27.3 (L) 09/21/2016 0334   PLT 294 09/21/2016 0334   MCV 86.4 09/21/2016 0334   MCH 26.9 09/21/2016 0334   MCHC 31.1 09/21/2016 0334   RDW 16.5 (H) 09/21/2016 0334   LYMPHSABS 0.8 06/14/2016 1147   MONOABS 0.4 06/14/2016 1147   EOSABS 0.2 06/14/2016 1147   BASOSABS 0.0 06/14/2016 1147    BMET    Component Value Date/Time   NA 134 (L) 09/22/2016 0332   K 4.4 09/22/2016 0332   CL 100 (L) 09/22/2016 0332   CO2 22 09/22/2016 0332   GLUCOSE 105 (H) 09/22/2016 0332   BUN 54 (H) 09/22/2016 0332   CREATININE 4.81 (H) 09/22/2016 0332   CREATININE 1.25 10/25/2013 1512   CALCIUM 8.3 (L) 09/22/2016 0332   GFRNONAA 13 (L) 09/22/2016 0332   GFRNONAA 66 10/25/2013 1512   GFRAA 15 (L) 09/22/2016 0332   GFRAA 77 10/25/2013 1512    INR    Component Value Date/Time   INR 0.96 06/14/2016 1147     Intake/Output Summary (Last 24 hours) at 09/22/16 1033 Last data filed at 09/22/16 0900  Gross per 24 hour  Intake          2440.13 ml  Output             1550 ml  Net           890.13 ml     Assessment:  56 y.o. male is here with acute tbad Plan: Have ordered mesenteric and renal duplexes but they cannot be performed this weekend unless urgent. Would not CT and does not seem clinically supported. Needs better bp control prior to discharge. Continue to hold on intervention unless clear malperfusion presents.    Sara Selvidge C. Randie Heinzain, MD [facility] Office: [phone] Pager: [phone]  09/22/2016 10:33 AM

## 2016-09-22 NOTE — Progress Notes (Signed)
  Subjective:  Denies any pain in abdomen  Ate some this morning. Bowels working  Objective: Vital signs in last 24 hours: Temp:  [97.5 F (36.4 C)-98.3 F (36.8 C)] 97.5 F (36.4 C) (09/02 0300) Pulse Rate:  [69-91] 91 (09/02 0500) Cardiac Rhythm: Normal sinus rhythm (09/02 0906) Resp:  [16-23] 22 (09/02 1100) BP: (108-155)/(56-79) 140/72 (09/02 1100) SpO2:  [80 %-98 %] 98 % (09/02 0800) Weight:  [90.6 kg (199 lb 11.8 oz)] 90.6 kg (199 lb 11.8 oz) (09/02 0500)  Hemodynamic parameters for last 24 hours:    Intake/Output from previous day: 09/01 0701 - 09/02 0700 In: 2322.6 [P.O.:1320; I.V.:1002.6] Out: 1450 [Urine:1450] Intake/Output this shift: Total I/O In: 440 [P.O.:360; I.V.:80] Out: 300 [Urine:300]  General appearance: restless but answers questions. Hard to understand his speech. Heart: regular rate and rhythm, S1, S2 normal, no murmur, click, rub or gallop Lungs: clear to auscultation bilaterally Abdomen: soft, non-tender; bowel sounds normal Extremities warm. Pedal and radial pulses palpable and equal bilaterally.  Lab Results:  Recent Labs  09/21/16 0334  WBC 8.0  HGB 8.5*  HCT 27.3*  PLT 294   BMET:  Recent Labs  09/21/16 0334 09/22/16 0332  NA 136 134*  K 3.9 4.4  CL 102 100*  CO2 24 22  GLUCOSE 100* 105*  BUN 50* 54*  CREATININE 4.47* 4.81*  CALCIUM 8.5* 8.3*    PT/INR: No results for input(s): LABPROT, INR in the last 72 hours. ABG    Component Value Date/Time   TCO2 23 06/14/2016 1205   CBG (last 3)   Recent Labs  09/19/16 1613 09/19/16 2029 09/20/16 0619  GLUCAP 169* 123* 84    Assessment/Plan:  Acute type B aortic dissection Acute on chronic kidney failure Uncontrolled HTN Drug abuse  He has no signs of end organ ischemia. He needs better BP control with SBP less than 130 ideally. Unfortunately he may not take any of the meds after he goes home.     LOS: 6 days    Alleen BorneBryan K Kenna Kirn 09/22/2016

## 2016-09-22 NOTE — Progress Notes (Signed)
PROGRESS NOTE    Austin Simmons  ZOX:[id number] DOB: 1960-11-22 DOA: 09/15/2016 PCP: System, Pcp Not In    Brief Narrative:  56 year old male who presented with chest pain. Patient isknown to have ischemic cardiomyopathy, history of CVA and cocaine abuse. He presented with precordial chest pain, 9 out of 10 in intensity radiated into his abdomen, for last 2-3 days prior to hospitalization. On his initial physical examination blood pressure was 203/88, heart rate 103, temperature is 97.7, respiratory rate 25, oxygenation saturation 96%. Patient was awake and alert, his lungs were noted to auscultation bilaterally, heart S1-S2 present and rhythmic, abdomen was protuberant and distended. Positive extremity edema 2+. Patient was found to have a type B aortic dissection.   Patient was admitted to the intensive care unit with a working diagnosis of hypertensive emergency complicated by a type B aortic dissection   Assessment & Plan:   Principal Problem:   Descending thoracic aortic dissection (HCC) Active Problems:   Cocaine abuse   Hypertension   Ischemic cardiomyopathy   Hypertensive emergency   AKI (acute kidney injury) (HCC)   CKD (chronic kidney disease), stage III   Pressure injury of skin   Hypertensive cardiomyopathy (HCC)   H/O noncompliance with medical treatment, presenting hazards to health   Acute renal failure superimposed on chronic kidney disease (HCC)   1. Hypertensive emergency complicated by type B aortic dissection in the setting of cocaine use. Systolic under 140 the majority of the time, with high peaks up to 155 and lowest nadir to 114. Will continue aggressive blood pressure control with b blocker and vasodilator therapy, hydralazine 100 mg tid, isosorbide 60 mg, nifedipine 90 mg, labetalol up to 600 mg bid, clevidipine drip and high doses of furosemide. Tolerating well lorazepam bid.    2. Acute kidney injury on chronic kidney disease stage III. Urine output  stable at 1,450 ml over last 24 hours, patient on serum cr at 4,81 from 4,47, serum K at 4,4 with bicarbonate at 22, will continue to follow renal panel in am. Positive edema at the lower extremities. Added aldactone  3. Cocaine abuse. Continue benzodiazepines, with good toleration.    4. Hypothyroidism. Levothyroxine, per home regimen.   5. Dyspepsia.  Maalox, for antiacid therapy. Abdomen is distended but no signs of obstruction. Tolerating po well.    DVT prophylaxis:scd Code Status:Full  Family Communication: Disposition Plan:   Consultants:  Cardiology   Nephrology   Procedures:    Antimicrobials:      Subjective: This am patient feeling cold, no chest pain or dyspnea, improved lower abdominal pain, no nausea or vomiting, positive flatus. Antihypertensive agents have been adjusted yesterday.   Objective: Vitals:   09/22/16 0400 09/22/16 0500 09/22/16 0600 09/22/16 0700  BP: 118/65 127/73 (!) 148/71 (!) 127/56  Pulse: 72 91    Resp: 18 (!) 23 (!) 22 (!) 21  Temp:      TempSrc:      SpO2: 91%     Weight:  90.6 kg (199 lb 11.8 oz)    Height:        Intake/Output Summary (Last 24 hours) at 09/22/16 0755 Last data filed at 09/22/16 0700  Gross per 24 hour  Intake           2322.6 ml  Output             1450 ml  Net            872.6 ml   [employer]  09/20/16 0548 09/21/16 0500 09/22/16 0500  Weight: 86.9 kg (191 lb 8 oz) 89.9 kg (198 lb 3.1 oz) 90.6 kg (199 lb 11.8 oz)    Examination:  General: deconditioned Neurology: Awake and alert, non focal  E ENT: mild pallor, no icterus, oral mucosa dry Cardiovascular: S1-S2 present, rhythmic, no gallops, rubs, or murmurs. No jugular venous distention, ++ pitting lower extremity edema. Pulmonary: decreased breath sounds bilaterally, adequate air movement, no wheezing, rhonchi or rales. Gastrointestinal. Abdomen distended and tympanic, positive bowel sounds, no organomegaly, non tender, no  rebound or guarding Skin. No rashes Musculoskeletal: no joint deformities     Data Reviewed: I have personally reviewed following labs and imaging studies  CBC:  Recent Labs Lab 09/15/16 2232 09/16/16 0607 09/17/16 0328 09/19/16 0742 09/21/16 0334  WBC 12.2* 10.5 8.0 8.5 8.0  HGB 9.7* 9.2* 8.4* 8.9* 8.5*  HCT 31.6* 29.0* 27.0* 28.9* 27.3*  MCV 88.3 87.6 87.9 87.8 86.4  PLT 298 256 212 267 294   Basic Metabolic Panel:  Recent Labs Lab 09/16/16 0607  09/17/16 1023 09/18/16 0709 09/19/16 0742 09/20/16 0241 09/21/16 0334 09/22/16 0332  NA 135  < >  --  133* 135 135 136 134*  K 3.0*  < >  --  4.0 4.3 4.0 3.9 4.4  CL 99*  < >  --  101 103 104 102 100*  CO2 24  < >  --  21* 21* 22 24 22   GLUCOSE 119*  < >  --  103* 85 89 100* 105*  BUN 48*  < >  --  51* 48* 50* 50* 54*  CREATININE 3.57*  < >  --  4.57* 4.28* 4.45* 4.47* 4.81*  CALCIUM 7.7*  < >  --  8.0* 8.3* 8.4* 8.5* 8.3*  MG 1.7  --  1.9  --  2.1  --   --   --   PHOS 3.2  --   --  5.1* 5.2*  5.1* 6.0*  --   --   < > = values in this interval not displayed. GFR: Estimated Creatinine Clearance: 18.4 mL/min (A) (by C-G formula based on SCr of 4.81 mg/dL (H)). Liver Function Tests:  Recent Labs Lab 09/18/16 0709 09/19/16 0742 09/20/16 0241 09/21/16 0334  AST  --   --   --  18  ALT  --   --   --  25  ALKPHOS  --   --   --  55  BILITOT  --   --   --  0.5  PROT  --   --   --  6.3*  ALBUMIN 2.6* 2.6* 2.6* 2.7*   No results for input(s): LIPASE, AMYLASE in the last 168 hours. No results for input(s): AMMONIA in the last 168 hours. Coagulation Profile: No results for input(s): INR, PROTIME in the last 168 hours. Cardiac Enzymes:  Recent Labs Lab 09/15/16 2232 09/16/16 0607 09/16/16 1024 09/16/16 1617  TROPONINI 0.27* 0.24* 0.51* 0.96*   BNP (last 3 results) No results for input(s): PROBNP in the last 8760 hours. HbA1C: No results for input(s): HGBA1C in the last 72 hours. CBG:  Recent Labs Lab  09/19/16 1613 09/19/16 2029 09/20/16 0619  GLUCAP 169* 123* 84   Lipid Profile: No results for input(s): CHOL, HDL, LDLCALC, TRIG, CHOLHDL, LDLDIRECT in the last 72 hours. Thyroid Function Tests: No results for input(s): TSH, T4TOTAL, FREET4, T3FREE, THYROIDAB in the last 72 hours. Anemia Panel: No results for input(s): VITAMINB12, FOLATE, FERRITIN, TIBC,  IRON, RETICCTPCT in the last 72 hours.    Radiology Studies: I have reviewed all of the imaging during this hospital visit personally     Scheduled Meds: . furosemide  80 mg Intravenous BID  . hydrALAZINE  100 mg Oral TID  . isosorbide mononitrate  60 mg Oral Daily  . labetalol  500 mg Oral BID  . levothyroxine  50 mcg Oral QAC breakfast  . LORazepam  1 mg Oral BID  . NIFEdipine  90 mg Oral Daily  . pantoprazole  40 mg Oral Daily  . polyethylene glycol  17 g Oral Daily   Continuous Infusions: . sodium chloride    . clevidipine 20 mg/hr (09/22/16 0703)     LOS: 6 days        Aylanie Cubillos Annett Gula, MD [facility] Pager [phone]

## 2016-09-23 LAB — BASIC METABOLIC PANEL
Anion gap: 9 (ref 5–15)
BUN: 58 mg/dL — AB (ref 6–20)
CALCIUM: 8.3 mg/dL — AB (ref 8.9–10.3)
CO2: 24 mmol/L (ref 22–32)
CREATININE: 4.94 mg/dL — AB (ref 0.61–1.24)
Chloride: 100 mmol/L — ABNORMAL LOW (ref 101–111)
GFR calc Af Amer: 14 mL/min — ABNORMAL LOW (ref >60)
GFR, EST NON AFRICAN AMERICAN: 12 mL/min — AB (ref >60)
Glucose, Bld: 104 mg/dL — ABNORMAL HIGH (ref 65–99)
Potassium: 4.3 mmol/L (ref 3.5–5.1)
SODIUM: 133 mmol/L — AB (ref 135–145)

## 2016-09-23 LAB — TRIGLYCERIDES: TRIGLYCERIDES: 118 mg/dL (ref <150)

## 2016-09-23 MED ORDER — CALCIUM CARBONATE ANTACID 500 MG PO CHEW
1.0000 | CHEWABLE_TABLET | Freq: Three times a day (TID) | ORAL | Status: DC
  Administered 2016-09-23 – 2016-09-25 (×5): 200 mg via ORAL
  Filled 2016-09-23 (×5): qty 1

## 2016-09-23 MED ORDER — LABETALOL HCL 300 MG PO TABS
800.0000 mg | ORAL_TABLET | Freq: Two times a day (BID) | ORAL | Status: DC
  Administered 2016-09-23 – 2016-09-25 (×4): 800 mg via ORAL
  Filled 2016-09-23 (×5): qty 2

## 2016-09-23 MED ORDER — ALUM & MAG HYDROXIDE-SIMETH 200-200-20 MG/5ML PO SUSP
15.0000 mL | Freq: Three times a day (TID) | ORAL | Status: DC
  Administered 2016-09-23: 15 mL via ORAL
  Filled 2016-09-23: qty 30

## 2016-09-23 MED ORDER — ORAL CARE MOUTH RINSE: 15.0000 mL | Freq: Two times a day (BID) | OROMUCOSAL | Status: DC

## 2016-09-23 NOTE — Progress Notes (Signed)
Patient's RN in contact room currently and unavailable to take call. Left message with secretary, that patient will need to be NPO after midnight for vascular studies in AM 9/4. Please call vascular lab with any questions. [phone]   Farrel DemarkJill Eunice, RDMS, RVT

## 2016-09-23 NOTE — Progress Notes (Signed)
PROGRESS NOTE    Austin Simmons  ZHY:[id number] DOB: 09-17-1960 DOA: 09/15/2016 PCP: System, Pcp Not In    Brief Narrative:  56 year old male who presented with chest pain. Patient isknown to have ischemic cardiomyopathy, history of CVA and cocaine abuse. He presented with precordial chest pain, 9 out of 10 in intensity radiated into his abdomen, for last 2-3 days prior to hospitalization. On his initial physical examination blood pressure was 203/88, heart rate 103, temperature is 97.7, respiratory rate 25, oxygenation saturation 96%. Patient was awake and alert, his lungs were noted to auscultation bilaterally, heart S1-S2 present and rhythmic, abdomen was protuberant and distended. Positive extremity edema 2+. Patient was found to have a type B aortic dissection.   Patient was admitted to the intensive care unit with a working diagnosis of hypertensive emergency complicated by a type B aortic dissection   Assessment & Plan:   Principal Problem:   Descending thoracic aortic dissection (HCC) Active Problems:   Cocaine abuse   Hypertension   Ischemic cardiomyopathy   Hypertensive emergency   AKI (acute kidney injury) (HCC)   CKD (chronic kidney disease), stage III   Pressure injury of skin   Hypertensive cardiomyopathy (HCC)   H/O noncompliance with medical treatment, presenting hazards to health   Acute renal failure superimposed on chronic kidney disease (HCC)   1. Hypertensive emergency complicated by type B aortic dissection in the setting of cocaine use. Aggressive blood pressure control with b blocker and vasodilator therapy: hydralazine 100 mg tid, isosorbide 60 mg, nifedipine 90 mg, labetalol up to 600 mg bid, aldactone 25 and furosemide. Blood pressure systolic has remained below 140, with only 3 peaks at 147, 146 and 145, no max dose of clevidipine drip. Will target systolic less than 130. Labetalol increased to 800 mg po bid.   2. Acute kidney injury on chronic kidney  disease stage III. Serum cr at 4,9, with k at 4,3 and serum bicarbonate at 24, urine output down to 700 over last 24 hours. On furosemide and aldactone, follow renal panel in am.   3. Cocaine abuse. Continuebenzodiazepines, with lorazepam 1 mg po bid, with good toleration.    4. Hypothyroidism. Continue Levothyroxine, per home regimen.   5. Dyspepsia.  Will schedule Maalox, with meals, for antiacid therapy.  DVT prophylaxis:scd Code Status:Full  Family Communication: Disposition Plan:   Consultants:  Cardiology   Nephrology   Vascular surgery/ Cardiothoracic surgery  Critical Care  Procedures:    Antimicrobials:     Subjective: No chest pain or dyspnea, complains of dyspepsia, mainly while eating, improved with antiacids, moderate in intensity, no associated symptoms. Unable to wean down IV antihypertensive infusion, per nursing.   Objective: Vitals:   09/23/16 0400 09/23/16 0500 09/23/16 0600 09/23/16 0800  BP: (!) 106/50 118/70 128/68 (!) 146/71  Pulse:      Resp: (!) 22 (!) 24 (!) 23 (!) 29  Temp:      TempSrc:      SpO2:      Weight:      Height:        Intake/Output Summary (Last 24 hours) at 09/23/16 0837 Last data filed at 09/23/16 0500  Gross per 24 hour  Intake             2080 ml  Output              700 ml  Net             1380 ml  Filed Weights   09/20/16 0548 09/21/16 0500 09/22/16 0500  Weight: 86.9 kg (191 lb 8 oz) 89.9 kg (198 lb 3.1 oz) 90.6 kg (199 lb 11.8 oz)    Examination:  General: Not in pain or dyspnea Neurology: Awake and alert, non focal  E ENT: no pallor, no icterus, oral mucosa moist Cardiovascular: S1-S2 present, rhythmic, no gallops, rubs, or murmurs. No jugular venous distention, +/++ pitting lower extremity edema. Pulmonary: vesicular breath sounds bilaterally, adequate air movement, no wheezing, rhonchi or rales. Gastrointestinal. Abdomen flat, no organomegaly, non tender, no rebound or  guarding Skin. No rashes Musculoskeletal: no joint deformities     Data Reviewed: I have personally reviewed following labs and imaging studies  CBC:  Recent Labs Lab 09/17/16 0328 09/19/16 0742 09/21/16 0334  WBC 8.0 8.5 8.0  HGB 8.4* 8.9* 8.5*  HCT 27.0* 28.9* 27.3*  MCV 87.9 87.8 86.4  PLT 212 267 294   Basic Metabolic Panel:  Recent Labs Lab 09/17/16 1023 09/18/16 0709 09/19/16 0742 09/20/16 0241 09/21/16 0334 09/22/16 0332 09/23/16 0310  NA  --  133* 135 135 136 134* 133*  K  --  4.0 4.3 4.0 3.9 4.4 4.3  CL  --  101 103 104 102 100* 100*  CO2  --  21* 21* 22 24 22 24   GLUCOSE  --  103* 85 89 100* 105* 104*  BUN  --  51* 48* 50* 50* 54* 58*  CREATININE  --  4.57* 4.28* 4.45* 4.47* 4.81* 4.94*  CALCIUM  --  8.0* 8.3* 8.4* 8.5* 8.3* 8.3*  MG 1.9  --  2.1  --   --   --   --   PHOS  --  5.1* 5.2*  5.1* 6.0*  --   --   --    GFR: Estimated Creatinine Clearance: 17.9 mL/min (A) (by C-G formula based on SCr of 4.94 mg/dL (H)). Liver Function Tests:  Recent Labs Lab 09/18/16 0709 09/19/16 0742 09/20/16 0241 09/21/16 0334  AST  --   --   --  18  ALT  --   --   --  25  ALKPHOS  --   --   --  55  BILITOT  --   --   --  0.5  PROT  --   --   --  6.3*  ALBUMIN 2.6* 2.6* 2.6* 2.7*   No results for input(s): LIPASE, AMYLASE in the last 168 hours. No results for input(s): AMMONIA in the last 168 hours. Coagulation Profile: No results for input(s): INR, PROTIME in the last 168 hours. Cardiac Enzymes:  Recent Labs Lab 09/16/16 1024 09/16/16 1617  TROPONINI 0.51* 0.96*   BNP (last 3 results) No results for input(s): PROBNP in the last 8760 hours. HbA1C: No results for input(s): HGBA1C in the last 72 hours. CBG:  Recent Labs Lab 09/19/16 1613 09/19/16 2029 09/20/16 0619  GLUCAP 169* 123* 84   Lipid Profile:  Recent Labs  09/23/16 0310  TRIG 118   Thyroid Function Tests: No results for input(s): TSH, T4TOTAL, FREET4, T3FREE, THYROIDAB in  the last 72 hours. Anemia Panel: No results for input(s): VITAMINB12, FOLATE, FERRITIN, TIBC, IRON, RETICCTPCT in the last 72 hours.    Radiology Studies: I have reviewed all of the imaging during this hospital visit personally     Scheduled Meds: . furosemide  80 mg Intravenous BID  . hydrALAZINE  100 mg Oral TID  . isosorbide mononitrate  60 mg Oral Daily  .  labetalol  600 mg Oral BID  . levothyroxine  50 mcg Oral QAC breakfast  . LORazepam  1 mg Oral BID  . NIFEdipine  90 mg Oral Daily  . pantoprazole  40 mg Oral Daily  . polyethylene glycol  17 g Oral Daily  . spironolactone  25 mg Oral Daily   Continuous Infusions: . sodium chloride    . clevidipine 20 mg/hr (09/23/16 0816)     LOS: 7 days      Austin Annett Gulaaniel Arrien, MD [facility] Pager [phone]

## 2016-09-23 NOTE — Progress Notes (Signed)
Progress Note  Patient Name: Austin Simmons Date of Encounter: 09/23/2016  Primary Cardiologist: Anne FuSkains    Subjective   Pt difficult to understand   Breathing is so-so  Has some chest discomfort  Pleuriitc  Has some abdominal discomfort  Inpatient Medications    Scheduled Meds: . furosemide  80 mg Intravenous BID  . hydrALAZINE  100 mg Oral TID  . isosorbide mononitrate  60 mg Oral Daily  . labetalol  600 mg Oral BID  . levothyroxine  50 mcg Oral QAC breakfast  . LORazepam  1 mg Oral BID  . NIFEdipine  90 mg Oral Daily  . pantoprazole  40 mg Oral Daily  . polyethylene glycol  17 g Oral Daily  . spironolactone  25 mg Oral Daily   Continuous Infusions: . sodium chloride    . clevidipine 20 mg/hr (09/23/16 0816)   PRN Meds: sodium chloride, alum & mag hydroxide-simeth, bisacodyl, hydrALAZINE, labetalol, LORazepam, oxyCODONE   Vital Signs    Vitals:   09/23/16 0400 09/23/16 0500 09/23/16 0600 09/23/16 0800  BP: (!) 106/50 118/70 128/68 (!) 146/71  Pulse:      Resp: (!) 22 (!) 24 (!) 23 (!) 29  Temp:      TempSrc:      SpO2:      Weight:      Height:        Intake/Output Summary (Last 24 hours) at 09/23/16 0828 Last data filed at 09/23/16 0500  Gross per 24 hour  Intake             2080 ml  Output              700 ml  Net             1380 ml   I/O  9.77 L positive    Filed Weights   09/20/16 0548 09/21/16 0500 09/22/16 0500  Weight: 191 lb 8 oz (86.9 kg) 198 lb 3.1 oz (89.9 kg) 199 lb 11.8 oz (90.6 kg)    Telemetry    SR - Personally Reviewed  ECG      Physical Exam   GEN: No acute distress.   Neck: No JVD Cardiac: RRR, no murmurs, rubs, or gallops.  Respiratory: Clear to auscultation bilaterally. GI:  Mildly distended.  + BS   MS: Tr edema; No deformity. Neuro:  Nonfocal  Psych: Normal affect   Labs    Chemistry Recent Labs Lab 09/19/16 0742 09/20/16 0241 09/21/16 0334 09/22/16 0332 09/23/16 0310  NA 135 135 136 134* 133*  K  4.3 4.0 3.9 4.4 4.3  CL 103 104 102 100* 100*  CO2 21* 22 24 22 24   GLUCOSE 85 89 100* 105* 104*  BUN 48* 50* 50* 54* 58*  CREATININE 4.28* 4.45* 4.47* 4.81* 4.94*  CALCIUM 8.3* 8.4* 8.5* 8.3* 8.3*  PROT  --   --  6.3*  --   --   ALBUMIN 2.6* 2.6* 2.7*  --   --   AST  --   --  18  --   --   ALT  --   --  25  --   --   ALKPHOS  --   --  55  --   --   BILITOT  --   --  0.5  --   --   GFRNONAA 14* 14* 14* 13* 12*  GFRAA 17* 16* 16* 15* 14*  ANIONGAP 11 9 10 12  9  Hematology Recent Labs Lab 09/17/16 0328 09/19/16 0742 09/21/16 0334  WBC 8.0 8.5 8.0  RBC 3.07* 3.29* 3.16*  HGB 8.4* 8.9* 8.5*  HCT 27.0* 28.9* 27.3*  MCV 87.9 87.8 86.4  MCH 27.4 27.1 26.9  MCHC 31.1 30.8 31.1  RDW 16.5* 16.6* 16.5*  PLT 212 267 294    Cardiac Enzymes Recent Labs Lab 09/16/16 1024 09/16/16 1617  TROPONINI 0.51* 0.96*   No results for input(s): TROPIPOC in the last 168 hours.   BNPNo results for input(s): BNP, PROBNP in the last 168 hours.   DDimer No results for input(s): DDIMER in the last 168 hours.   Radiology    No results found.  Cardiac Studies    Patient Profile    56 year old male with past medical history of coronary artery disease, ischemic cardiomyopathy, prior CVA, hypertension, cocaine abuse, noncompliance admitted with hypertensive emergency, type B aortic dissection who we are asked to evaluate for dissection and hypertensive emergency. Patient presented with complaints of 2-3 days of chest, epigastric and abdominal pain. Initial blood pressure 243/147. Patient had not been taking medications at home. Drug screen positive for cocaine. CTA reveals type B aortic dissection distal to the left subclavian.     Assessment & Plan    1  Type B aortid disection  Will advance labetalol to 800 bid   Follow BP and HR    2 CAD  I am not convinced represents angina  Pleuritic  3  Hx ICM  LVEF 40 to 45%  VOlume is up  Renal follow  Cr has bumped mildly    4  Hx cocaine  abuse  5. Acute on chronic kidney dz  Cr 4.94 today  Renal following    6   HTN emergency  Pt with multiple meds now  Advance to bring SBP and HR down   COmpliance will prob be an issue   7  Noncompliance    Signed, Dietrich Pates, MD  09/23/2016, 8:28 AM

## 2016-09-23 NOTE — Progress Notes (Signed)
OT Cancellation Note  Patient Details Name: Austin RivalJames E Eagles MRN: [id number] DOB: 02-Dec-1960   Cancelled Treatment:    Reason Eval/Treat Not Completed: Fatigue/lethargy limiting ability to participate.  Pt states he didn't sleep last night and is too fatigued to get up.  Will reattempt.  Torsten Weniger [place], OTR/L [phone]   Jeani HawkingConarpe, [place] Staffa M 09/23/2016, 11:02 AM

## 2016-09-23 NOTE — Progress Notes (Signed)
PT Cancellation Note  Patient Details Name: Austin RivalJames E Charon MRN: [id number] DOB: [birth year]   Cancelled Treatment:    Reason Eval/Treat Not Completed: Patient declined, no reason specified   TurkeyVictoria L Raenah Murley 09/23/2016, 1:25 PM

## 2016-09-23 NOTE — Progress Notes (Signed)
Assessment/ Plan:   1.Acute kidney injury on chronic kidney disease stage III: Secondary to contrast nephropathy as well as acute blood pressure lowering required for management of dissecting aortic aneurysm.  2. Type B acute aortic dissection: continue efforts at titration of antihypertensive therapy with the goal of getting him off of Clevidipine drip. No plans for intervention at this time for vascular surgery. Diurese 3. Hypertension:Improved 4.Anemia:Continue intravenous iron. Will consider ESA after blood pressure control is streamlined. 5. Hyponatremia: Secondary to free water excretion deficit with acute kidney injury and vol xs. Diurese  Subjective: Interval History: chronic pain  Objective: Vital signs in last 24 hours: Temp:  [98.4 F (36.9 C)-98.7 F (37.1 C)] 98.6 F (37 C) (09/03 0857) Resp:  [15-29] 18 (09/03 1400) BP: (103-147)/(50-76) 115/66 (09/03 1400) SpO2:  [91 %-98 %] 91 % (09/03 1400) Weight change:   Intake/Output from previous day: 09/02 0701 - 09/03 0700 In: 2080 [P.O.:1200; I.V.:880] Out: 700 [Urine:700] Intake/Output this shift: Total I/O In: 720 [P.O.:360; I.V.:360] Out: 1 [Urine:1]  General appearance: appears older than stated age Chest wall: no tenderness Extremities: edema 2+  Lab Results:  Recent Labs  09/21/16 0334  WBC 8.0  HGB 8.5*  HCT 27.3*  PLT 294   BMET:  Recent Labs  09/22/16 0332 09/23/16 0310  NA 134* 133*  K 4.4 4.3  CL 100* 100*  CO2 22 24  GLUCOSE 105* 104*  BUN 54* 58*  CREATININE 4.81* 4.94*  CALCIUM 8.3* 8.3*   No results for input(s): PTH in the last 72 hours. Iron Studies: No results for input(s): IRON, TIBC, TRANSFERRIN, FERRITIN in the last 72 hours. Studies/Results: No results found.  Scheduled: . alum & mag hydroxide-simeth  15 mL Oral TID AC & HS  . furosemide  80 mg Intravenous BID  . hydrALAZINE  100 mg Oral TID  . isosorbide mononitrate  60 mg Oral Daily  . labetalol  800 mg Oral BID   . levothyroxine  50 mcg Oral QAC breakfast  . LORazepam  1 mg Oral BID  . NIFEdipine  90 mg Oral Daily  . pantoprazole  40 mg Oral Daily  . polyethylene glycol  17 g Oral Daily  . spironolactone  25 mg Oral Daily     LOS: 7 days   Jaydi Bray C 09/23/2016,2:23 PM

## 2016-09-23 NOTE — Progress Notes (Signed)
  Subjective:  Wants to go home. Sitting up in chair eating. Says he feels bloated but ate all of breakfast as fast as he could shovel it in.  Objective: Vital signs in last 24 hours: Temp:  [98 F (36.7 C)-98.7 F (37.1 C)] 98.6 F (37 C) (09/03 0857) Cardiac Rhythm: Normal sinus rhythm (09/03 0818) Resp:  [19-29] 20 (09/03 0900) BP: (106-148)/(50-90) 145/72 (09/03 0900) SpO2:  [96 %] 96 % (09/02 2054)  Hemodynamic parameters for last 24 hours:    Intake/Output from previous day: 09/02 0701 - 09/03 0700 In: 2080 [P.O.:1200; I.V.:880] Out: 700 [Urine:700] Intake/Output this shift: No intake/output data recorded.  General appearance: alert and cooperative but hard to understand his speech.  Neurologic: intact Heart: regular rate and rhythm, S1, S2 normal, no murmur, click, rub or gallop Lungs: clear to auscultation bilaterally Abdomen: soft, non-tender; protuberant, bowel sounds normal;  Extremities: extremities normal, atraumatic, no cyanosis or edema  Lab Results:  Recent Labs  09/21/16 0334  WBC 8.0  HGB 8.5*  HCT 27.3*  PLT 294   BMET:  Recent Labs  09/22/16 0332 09/23/16 0310  NA 134* 133*  K 4.4 4.3  CL 100* 100*  CO2 22 24  GLUCOSE 105* 104*  BUN 54* 58*  CREATININE 4.81* 4.94*  CALCIUM 8.3* 8.3*    PT/INR: No results for input(s): LABPROT, INR in the last 72 hours. ABG    Component Value Date/Time   TCO2 23 06/14/2016 1205   CBG (last 3)  No results for input(s): GLUCAP in the last 72 hours.  Assessment/Plan:  Acute type B aortic dissection Remains on Cleviprex. Medical team managing BP meds. No indication for surgical intervention I doubt any mesenteric ischemia the way he is eating and bowels are working.   LOS: 7 days    Alleen BorneBryan K Antavius Sperbeck 09/23/2016

## 2016-09-23 NOTE — Progress Notes (Signed)
  Progress Note    09/23/2016 1:54 PM * No surgery found *  Subjective:  Wants to go home  Vitals:   09/23/16 1300 09/23/16 1330  BP: (!) 136/55 130/68  Pulse:    Resp: 19 (!) 22  Temp:    SpO2:      Physical Exam: Awake and alert Abdomen is soft and minimally distended 2+ pt bilaterally   CBC    Component Value Date/Time   WBC 8.0 09/21/2016 0334   RBC 3.16 (L) 09/21/2016 0334   HGB 8.5 (L) 09/21/2016 0334   HCT 27.3 (L) 09/21/2016 0334   PLT 294 09/21/2016 0334   MCV 86.4 09/21/2016 0334   MCH 26.9 09/21/2016 0334   MCHC 31.1 09/21/2016 0334   RDW 16.5 (H) 09/21/2016 0334   LYMPHSABS 0.8 06/14/2016 1147   MONOABS 0.4 06/14/2016 1147   EOSABS 0.2 06/14/2016 1147   BASOSABS 0.0 06/14/2016 1147    BMET    Component Value Date/Time   NA 133 (L) 09/23/2016 0310   K 4.3 09/23/2016 0310   CL 100 (L) 09/23/2016 0310   CO2 24 09/23/2016 0310   GLUCOSE 104 (H) 09/23/2016 0310   BUN 58 (H) 09/23/2016 0310   CREATININE 4.94 (H) 09/23/2016 0310   CREATININE 1.25 10/25/2013 1512   CALCIUM 8.3 (L) 09/23/2016 0310   GFRNONAA 12 (L) 09/23/2016 0310   GFRNONAA 66 10/25/2013 1512   GFRAA 14 (L) 09/23/2016 0310   GFRAA 77 10/25/2013 1512    INR    Component Value Date/Time   INR 0.96 06/14/2016 1147     Intake/Output Summary (Last 24 hours) at 09/23/16 1354 Last data filed at 09/23/16 1300  Gross per 24 hour  Intake             1840 ml  Output              201 ml  Net             1639 ml    Assessment: 55 y.o.maleis here with acute tbad Plan: Renal duplex to be performed tomorrow with elevating Cr. Ordered mesenteric as well although he is eating and without abdominal pain. Would not CT and does not seem clinically supported. Needs better bp control prior to discharge. Continue to hold on intervention unless clear malperfusion presents.     Byrd Terrero C. Randie Heinzain, MD [facility] [place] Office: [phone] Pager:  [phone]  09/23/2016 1:54 PM

## 2016-09-23 NOTE — Progress Notes (Signed)
Case reviewed for LOS; B Anothy Bufano RN,MHA,BSN [phone] 

## 2016-09-24 ENCOUNTER — Inpatient Hospital Stay (HOSPITAL_COMMUNITY): Payer: Medicare Other

## 2016-09-24 DIAGNOSIS — I7101 Dissection of thoracic aorta: Secondary | ICD-10-CM

## 2016-09-24 DIAGNOSIS — I7102 Dissection of abdominal aorta: Secondary | ICD-10-CM

## 2016-09-24 LAB — BASIC METABOLIC PANEL
Anion gap: 12 (ref 5–15)
BUN: 64 mg/dL — AB (ref 6–20)
CALCIUM: 8.4 mg/dL — AB (ref 8.9–10.3)
CHLORIDE: 97 mmol/L — AB (ref 101–111)
CO2: 23 mmol/L (ref 22–32)
CREATININE: 5.36 mg/dL — AB (ref 0.61–1.24)
GFR calc Af Amer: 13 mL/min — ABNORMAL LOW (ref >60)
GFR calc non Af Amer: 11 mL/min — ABNORMAL LOW (ref >60)
Glucose, Bld: 104 mg/dL — ABNORMAL HIGH (ref 65–99)
Potassium: 4.6 mmol/L (ref 3.5–5.1)
Sodium: 132 mmol/L — ABNORMAL LOW (ref 135–145)

## 2016-09-24 MED ORDER — LORAZEPAM 2 MG/ML IJ SOLN: 1.0000 mg | Freq: Once | INTRAMUSCULAR | Status: DC

## 2016-09-24 MED ORDER — CALCIUM CARBONATE ANTACID 500 MG PO CHEW
2.0000 | CHEWABLE_TABLET | Freq: Three times a day (TID) | ORAL | Status: DC | PRN
  Administered 2016-09-24: 400 mg via ORAL
  Administered 2016-09-24: 200 mg via ORAL
  Administered 2016-09-24: 400 mg via ORAL
  Filled 2016-09-24 (×3): qty 2

## 2016-09-24 MED ORDER — HALOPERIDOL 0.5 MG PO TABS
1.0000 mg | ORAL_TABLET | ORAL | Status: DC | PRN
  Administered 2016-09-24 – 2016-09-25 (×2): 1 mg via ORAL
  Filled 2016-09-24 (×2): qty 2

## 2016-09-24 MED ORDER — FUROSEMIDE 10 MG/ML IJ SOLN
80.0000 mg | Freq: Once | INTRAMUSCULAR | Status: AC
  Administered 2016-09-24: 80 mg via INTRAVENOUS
  Filled 2016-09-24: qty 8

## 2016-09-24 MED ORDER — HALOPERIDOL LACTATE 5 MG/ML IJ SOLN: 1.0000 mg | INTRAMUSCULAR | Status: DC | PRN

## 2016-09-24 MED ORDER — HALOPERIDOL LACTATE 5 MG/ML IJ SOLN
2.5000 mg | Freq: Once | INTRAMUSCULAR | Status: AC
  Administered 2016-09-24: 2.5 mg via INTRAVENOUS
  Filled 2016-09-24: qty 1

## 2016-09-24 NOTE — Care Management Note (Signed)
Case Management Note  Patient Details  Name: Austin Simmons MRN: [id number] Date of Birth: January 21, 1961  Subjective/Objective:   From home presents with chest pain and hypertensive crisis with hx of cocaine use and medical non-compliance.Cardiothoracic consulted for aortic dissection. Patient is very dysarthric, he states he has two sons, Austin Simmons and Austin Simmons. He states a friend brought him to the hospital. He may need transportation assistance at discharge. He has no PCP, looks like he has been to the [facility] before, [facility].   8/30 1111 [name] RN, BSN- per pt eval rec HHPT and rolling walker, NCM offered choice for [facility], [facility] .  Soc will begin 24-48 hrs post dc.  Will need order for HHPT with face to face.  The DME order is in for rolling walker already.  9/4 1020 [name] RN, BSN - Aortic dissection, no indication for surgery at this point, he has having severe pain, increased BP, increased cr, weaning cleviprix drip  today, will continue iv lasix. He has been very agitated and uncooperative , was put in 4 point restraints.                   Action/Plan:   Expected Discharge Date:                  Expected Discharge Plan:  Home w Home Health Services  In-House Referral:     Discharge planning Services  CM Consult, Follow-up appt scheduled  Post Acute Care Choice:  Durable Medical Equipment, Home Health Choice offered to:  Patient  DME Arranged:  Walker rolling DME Agency:  [facility]  [facility]  Status of Service:  In process, will continue to follow  If discussed at Long Length of Stay Meetings, dates discussed:    Additional Comments:  [name], [name], RN 09/24/2016, 10:20 AM

## 2016-09-24 NOTE — Progress Notes (Signed)
Text-paged Schorr NP regarding patient becoming increasingly agitated, uncooperative with therapies and treatments, and the removal of oxygen and other monitoring devices necessary for his care. Orders received for restraints.  Will apply and will continue to monitor the patient closely.  Marlou PorchBradley Majel Simmons

## 2016-09-24 NOTE — Progress Notes (Addendum)
Preliminary results by tech - Mesenteric Duplex Completed. Suboptimal study due to patient's body habitus. SMA and celiac artery appears patent in the segments that were visualized. The distal SMA and IMA were not visualized.  The aorta is patent with abnormal waveforms and elevated velocities consistent with patient's history.  Marilynne Halstedita Roni Friberg, BS, RDMS, RVT

## 2016-09-24 NOTE — Progress Notes (Signed)
Assessment/ Plan:   1.Acute kidney injury on chronic kidney disease stage WUJ:[id number]:Secondary to contrast nephropathy as well as acute blood pressure lowering required for management of dissecting aortic aneurysm.  2. Type B acute aortic dissection:continue efforts at titration of antihypertensive therapywith the goal of getting him off of Clevidipinedrip. No plans for intervention at this time for vascular surgery. Diurese 3. Hypertension:Improved 4.Anemia:Continue intravenous iron.Will consider ESA after blood pressure control is streamlined. 5.Hyponatremia:Secondary to free water excretion deficit with acute kidney injury and vol xs. Diurese 6. Volume overload. Cont diuresis, CXR  Subjective: Interval History:Multiple complaints  Objective: Vital signs in last 24 hours: Temp:  [97.5 F (36.4 C)-99.2 F (37.3 C)] 98.3 F (36.8 C) (09/04 1100) Pulse Rate:  [26-83] 74 (09/04 1530) Resp:  [17-32] 22 (09/04 1530) BP: (104-148)/(45-97) 137/67 (09/04 1530) SpO2:  [72 %-99 %] 95 % (09/04 1530) Weight:  [89.7 kg (197 lb 12 oz)] 89.7 kg (197 lb 12 oz) (09/04 0330) Weight change:   Intake/Output from previous day: 09/03 0701 - 09/04 0700 In: 1408 [P.O.:360; I.V.:1048] Out: 228 [Urine:228] Intake/Output this shift: Total I/O In: 829.9 [P.O.:540; I.V.:289.9] Out: 1200 [Urine:1200]  General appearance: cooperative GI: distended Extremities: edema 2+  Lab Results: No results for input(s): WBC, HGB, HCT, PLT in the last 72 hours. BMET:  Recent Labs  09/23/16 0310 09/24/16 1148  NA 133* 132*  K 4.3 4.6  CL 100* 97*  CO2 24 23  GLUCOSE 104* 104*  BUN 58* 64*  CREATININE 4.94* 5.36*  CALCIUM 8.3* 8.4*   No results for input(s): PTH in the last 72 hours. Iron Studies: No results for input(s): IRON, TIBC, TRANSFERRIN, FERRITIN in the last 72 hours. Studies/Results: No results found.  Scheduled: . calcium carbonate  1 tablet Oral TID WC  . hydrALAZINE  100 mg Oral TID   . isosorbide mononitrate  60 mg Oral Daily  . labetalol  800 mg Oral BID  . levothyroxine  50 mcg Oral QAC breakfast  . LORazepam  1 mg Oral BID  . mouth rinse  15 mL Mouth Rinse BID  . NIFEdipine  90 mg Oral Daily  . pantoprazole  40 mg Oral Daily  . polyethylene glycol  17 g Oral Daily     LOS: 8 days   Dewaun Kinzler C 09/24/2016,3:41 PM

## 2016-09-24 NOTE — Progress Notes (Addendum)
PROGRESS NOTE    Austin Simmons  ZOX:[id number] DOB: September 03, 1960 DOA: 09/15/2016 PCP: System, Pcp Not In    Brief Narrative:  56 year old male who presented with chest pain. Patient isknown to have ischemic cardiomyopathy, history of CVA and cocaine abuse. He presented with precordial chest pain, 9 out of 10 in intensity radiated into his abdomen, for last 2-3 days prior to hospitalization. On his initial physical examination blood pressure was 203/88, heart rate 103, temperature is 97.7, respiratory rate 25, oxygenation saturation 96%. Patient was awake and alert, his lungs were noted to auscultation bilaterally, heart S1-S2 present and rhythmic, abdomen was protuberant and distended. Positive extremity edema 2+. Patient was found to have a type B aortic dissection.   Patient was admitted to the intensive care unit with a working diagnosis of hypertensive emergency complicated by a type B aortic dissection.   Patient on multiple antihypertensive agents, requiring IV antihypertensive continuous infusion. Has developed confusion and agitation, required restrains.    Assessment & Plan:   Principal Problem:   Descending thoracic aortic dissection (HCC) Active Problems:   Cocaine abuse   Hypertension   Ischemic cardiomyopathy   Hypertensive emergency   AKI (acute kidney injury) (HCC)   CKD (chronic kidney disease), stage III   Pressure injury of skin   Hypertensive cardiomyopathy (HCC)   H/O noncompliance with medical treatment, presenting hazards to health   Acute renal failure superimposed on chronic kidney disease (HCC)   1. Hypertensive emergency complicated by type B aortic dissection in the setting of cocaine use. Will continue aggressive blood pressure control with b blocker and vasodilator therapy: hydralazine 100 mg tid, isosorbide 60 mg, nifedipine 90 mg, labetalol up to 800 mg bid, aldactone 25 and furosemide. Blood pressure has been mainly below 140, with 3 peaks of 148  mmHG, the heart rate between 60 and 80. Will continue scheduled lorazepam for now. Will target systolic blood pressure in the 130 to 140 range, continue to wean off clevidipine infusion.    2. Acute kidney injury on chronic kidney disease stage III. Worsening renal function with cr up to 5,36, clinically patient intravsculary dry, urine output down to 228 ml over last 24 hours, will hold on diuretic therapy for now, and will continue to follow with nephrology recommendations, avoid nephrotoxic medications. For renal doppler studies today. No ace inh for now.     3. Cocaine abuse. On scheduled dose ofbenzodiazepines, lorazepam 1 mg po bid, continue neuro checks.   4. Hypothyroidism. On Levothyroxine.   5. Dyspepsia. Continue antiacid therapy, patient tolerating po well, no nausea or vomiting. .  DVT prophylaxis:scd Code Status:Full  Family Communication: Disposition Plan:   Consultants:  Cardiology   Nephrology   Vascular surgery/ Cardiothoracic surgery  Critical Care  Procedures:    Antimicrobials:       Subjective: Patient is confused, no nausea or vomiting, complains of chest pain but no dyspnea. Unable to get accurate history due to patient's confusion. Most information from nursing at the bedside, patient confused and not cooperative, required restrains and sitter.   Objective: Vitals:   09/24/16 0730 09/24/16 0753 09/24/16 0800 09/24/16 0830  BP: 138/78  (!) 148/78 136/80  Pulse: 79  83 82  Resp: (!) 23  17 (!) 24  Temp:  99.2 F (37.3 C)    TempSrc:      SpO2: 95%  95% (!) 89%  Weight:      Height:        Intake/Output Summary (Last  24 hours) at 09/24/16 0924 Last data filed at 09/24/16 0818  Gross per 24 hour  Intake             1050 ml  Output              627 ml  Net              423 ml   Filed Weights   09/21/16 0500 09/22/16 0500 09/24/16 0330  Weight: 89.9 kg (198 lb 3.1 oz) 90.6 kg (199 lb 11.8 oz) 89.7 kg (197 lb 12 oz)      Examination:  General: deconditioned and ill looking appearing Neurology: Confused and disorientated, non focal  E ENT: no pallor, no icterus, oral mucosa dry Cardiovascular: S1-S2 present, rhythmic, no gallops, rubs, or murmurs. No jugular venous distention, +/++ lower extremity edema. Pulmonary: deceased breath sounds bilaterally at bases, adequate air movement, no wheezing, rhonchi or rales. Gastrointestinal. Abdomen flat, no organomegaly, non tender, no rebound or guarding Skin. No rashes Musculoskeletal: no joint deformities     Data Reviewed: I have personally reviewed following labs and imaging studies  CBC:  Recent Labs Lab 09/19/16 0742 09/21/16 0334  WBC 8.5 8.0  HGB 8.9* 8.5*  HCT 28.9* 27.3*  MCV 87.8 86.4  PLT 267 294   Basic Metabolic Panel:  Recent Labs Lab 09/17/16 1023  09/18/16 0709 09/19/16 0742 09/20/16 0241 09/21/16 0334 09/22/16 0332 09/23/16 0310  NA  --   < > 133* 135 135 136 134* 133*  K  --   < > 4.0 4.3 4.0 3.9 4.4 4.3  CL  --   < > 101 103 104 102 100* 100*  CO2  --   < > 21* 21* 22 24 22 24   GLUCOSE  --   < > 103* 85 89 100* 105* 104*  BUN  --   < > 51* 48* 50* 50* 54* 58*  CREATININE  --   < > 4.57* 4.28* 4.45* 4.47* 4.81* 4.94*  CALCIUM  --   < > 8.0* 8.3* 8.4* 8.5* 8.3* 8.3*  MG 1.9  --   --  2.1  --   --   --   --   PHOS  --   --  5.1* 5.2*  5.1* 6.0*  --   --   --   < > = values in this interval not displayed. GFR: Estimated Creatinine Clearance: 17.8 mL/min (A) (by C-G formula based on SCr of 4.94 mg/dL (H)). Liver Function Tests:  Recent Labs Lab 09/18/16 0709 09/19/16 0742 09/20/16 0241 09/21/16 0334  AST  --   --   --  18  ALT  --   --   --  25  ALKPHOS  --   --   --  55  BILITOT  --   --   --  0.5  PROT  --   --   --  6.3*  ALBUMIN 2.6* 2.6* 2.6* 2.7*   No results for input(s): LIPASE, AMYLASE in the last 168 hours. No results for input(s): AMMONIA in the last 168 hours. Coagulation Profile: No results  for input(s): INR, PROTIME in the last 168 hours. Cardiac Enzymes: No results for input(s): CKTOTAL, CKMB, CKMBINDEX, TROPONINI in the last 168 hours. BNP (last 3 results) No results for input(s): PROBNP in the last 8760 hours. HbA1C: No results for input(s): HGBA1C in the last 72 hours. CBG:  Recent Labs Lab 09/19/16 1613 09/19/16 2029 09/20/16 1610  GLUCAP  169* 123* 84   Lipid Profile:  Recent Labs  09/23/16 0310  TRIG 118   Thyroid Function Tests: No results for input(s): TSH, T4TOTAL, FREET4, T3FREE, THYROIDAB in the last 72 hours. Anemia Panel: No results for input(s): VITAMINB12, FOLATE, FERRITIN, TIBC, IRON, RETICCTPCT in the last 72 hours.    Radiology Studies: I have reviewed all of the imaging during this hospital visit personally     Scheduled Meds: . calcium carbonate  1 tablet Oral TID WC  . furosemide  80 mg Intravenous BID  . hydrALAZINE  100 mg Oral TID  . isosorbide mononitrate  60 mg Oral Daily  . labetalol  800 mg Oral BID  . levothyroxine  50 mcg Oral QAC breakfast  . LORazepam  1 mg Oral BID  . mouth rinse  15 mL Mouth Rinse BID  . NIFEdipine  90 mg Oral Daily  . pantoprazole  40 mg Oral Daily  . polyethylene glycol  17 g Oral Daily  . spironolactone  25 mg Oral Daily   Continuous Infusions: . sodium chloride    . clevidipine 21 mg/hr (09/24/16 0919)     LOS: 8 days       Mauricio Annett Gula, MD [facility] Pager [phone]

## 2016-09-24 NOTE — Progress Notes (Signed)
Progress Note  Patient Name: Austin Simmons Date of Encounter: 09/24/2016  Primary Cardiologist:   Dr. Anne FuSkains  Subjective   He reports continued 10/10 chest pain.  He appears to be in no distress  Inpatient Medications    Scheduled Meds: . calcium carbonate  1 tablet Oral TID WC  . furosemide  80 mg Intravenous BID  . hydrALAZINE  100 mg Oral TID  . isosorbide mononitrate  60 mg Oral Daily  . labetalol  800 mg Oral BID  . levothyroxine  50 mcg Oral QAC breakfast  . LORazepam  1 mg Oral BID  . mouth rinse  15 mL Mouth Rinse BID  . NIFEdipine  90 mg Oral Daily  . pantoprazole  40 mg Oral Daily  . polyethylene glycol  17 g Oral Daily  . spironolactone  25 mg Oral Daily   Continuous Infusions: . sodium chloride    . clevidipine 21 mg/hr (09/24/16 0919)   PRN Meds: sodium chloride, bisacodyl, calcium carbonate, hydrALAZINE, labetalol, LORazepam, oxyCODONE   Vital Signs    Vitals:   09/24/16 0730 09/24/16 0753 09/24/16 0800 09/24/16 0830  BP: 138/78  (!) 148/78 136/80  Pulse: 79  83 82  Resp: (!) 23  17 (!) 24  Temp:  99.2 F (37.3 C)    TempSrc:      SpO2: 95%  95% (!) 89%  Weight:      Height:        Intake/Output Summary (Last 24 hours) at 09/24/16 0927 Last data filed at 09/24/16 0818  Gross per 24 hour  Intake             1050 ml  Output              627 ml  Net              423 ml   Filed Weights   09/21/16 0500 09/22/16 0500 09/24/16 0330  Weight: 198 lb 3.1 oz (89.9 kg) 199 lb 11.8 oz (90.6 kg) 197 lb 12 oz (89.7 kg)    Telemetry    NSR with ectopy - Personally Reviewed  ECG    NA - Personally Reviewed  Physical Exam   GEN: No acute distress.   Neck: No  JVD Cardiac: RRR, no murmurs, rubs, or gallops.  Respiratory: Clear  to auscultation bilaterally. GI: Soft, nontender, non-distended  MS:   Mild right greater than left leg edema; No deformity. Neuro:  Nonfocal  Psych: Normal affect   Labs    Chemistry Recent Labs Lab  09/19/16 0742 09/20/16 0241 09/21/16 0334 09/22/16 0332 09/23/16 0310  NA 135 135 136 134* 133*  K 4.3 4.0 3.9 4.4 4.3  CL 103 104 102 100* 100*  CO2 21* 22 24 22 24   GLUCOSE 85 89 100* 105* 104*  BUN 48* 50* 50* 54* 58*  CREATININE 4.28* 4.45* 4.47* 4.81* 4.94*  CALCIUM 8.3* 8.4* 8.5* 8.3* 8.3*  PROT  --   --  6.3*  --   --   ALBUMIN 2.6* 2.6* 2.7*  --   --   AST  --   --  18  --   --   ALT  --   --  25  --   --   ALKPHOS  --   --  55  --   --   BILITOT  --   --  0.5  --   --   GFRNONAA 14* 14* 14* 13* 12*  GFRAA 17* 16* 16* 15* 14*  ANIONGAP 11 9 10 12 9      Hematology Recent Labs Lab 09/19/16 0742 09/21/16 0334  WBC 8.5 8.0  RBC 3.29* 3.16*  HGB 8.9* 8.5*  HCT 28.9* 27.3*  MCV 87.8 86.4  MCH 27.1 26.9  MCHC 30.8 31.1  RDW 16.6* 16.5*  PLT 267 294    Cardiac EnzymesNo results for input(s): TROPONINI in the last 168 hours. No results for input(s): TROPIPOC in the last 168 hours.   BNPNo results for input(s): BNP, PROBNP in the last 168 hours.   DDimer No results for input(s): DDIMER in the last 168 hours.   Radiology    No results found.  Cardiac Studies   Echo  09/17/16   - Left ventricle: The cavity size was normal. There was severe   concentric hypertrophy. Systolic function was mildly to   moderately reduced. The estimated ejection fraction was in the   range of 40% to 45%. Diffuse hypokinesis. There is akinesis of   the inferoseptal myocardium. Doppler parameters are consistent   with abnormal left ventricular relaxation (grade 1 diastolic   dysfunction). - Ventricular septum: The contour showed diastolic flattening. - Aortic valve: Trileaflet; mildly thickened, mildly calcified   leaflets. - Left atrium: The atrium was severely dilated. - Right ventricle: The cavity size was mildly dilated. Wall   thickness was normal. - Right atrium: The atrium was severely dilated.  Impressions:  - Compared to the prior study, there has been no  significant   interval change.   Patient Profile     56 y.o. male with past medical history of coronary artery disease, ischemic cardiomyopathy, prior CVA, hypertension, cocaine abuse, noncompliance admitted with hypertensive emergency, type B aortic dissection who we are asked to evaluate for dissection and hypertensive emergency. Patient presented with complaints of 2-3 days of chest, epigastric and abdominal pain. Initial blood pressure 243/147. Patient had not been taking medications at home. Drug screen positive for cocaine. CTA reveals type B aortic dissection distal to the left subclavian.   Assessment & Plan    Type B Aortic Dissection:    No indication for surgery at this point.  Medical management to include good BP control.  Patient still reports severe pain.    HTN:  BP is better today.  On multiple PO meds.  Will try to wean Cleviprex today.    CAD:  Medical management.   Elevated troponin but EKG which chronic changes and echo EF unchanged.  Thought to be secondary to hypertension.    ISCHEMIC CARDIOMYOPATHY:  Continuing IV Lasix.   CKD:  Creat continues to climb.  Renal is following.  Renal artery duplex being done.    Signed, Rollene Rotunda, MD  09/24/2016, 9:27 AM

## 2016-09-24 NOTE — Progress Notes (Signed)
Schorr NP paged regarding patient's ongoing confusion and agitation despite scheduled and PRN medication administration, restraints, and a Recruitment consultantsafety sitter.  Orders received. Will carry out and will continue to monitor the patient closely.   [name]

## 2016-09-24 NOTE — Progress Notes (Addendum)
Preliminary results by tech - Renal Duplex Completed. Suboptimal study due to patient's body habitus. Bilateral renal arteries appear patent without significant stenosis in the segments that were visualized. The origin of the left renal artery was not visualized. Unable to visualize a dissection into the renal arteries. Austin Simmons, BS, RDMS, RVT

## 2016-09-24 NOTE — Progress Notes (Addendum)
   Daily Progress Note   Assessment/Planning:   Acute Simple Type B Aortic dissection with no evidence of malperfusion, Acute on chronic renal disease, poorly controlled HTN, Cocaine abuse   Overnight placed on restraints due to competition with care  Hold narcotics to avoid confusion with AMS due to hypoperfusion of brain  Unclear if his AMS is due to polysubstance withdrawl  Would prefer to avoid placing TEVAR while renal function acutely reduced.  Usually 50-100 cc of contrast is used to image the aortic arch to determine landing zone and completion aortogram to determine if resolution of proximal flap.  The additional contrast in this setting might result in ESRD requiring hemodialysis.  Additionally, without modification of behavior, repeat severe HTN associated with cocaine use can result in thoracic endograft infolding, essentially leading to functional occlusion of the thoracic aorta.  Continued BP titration   Subjective   Events overnight noted, c/o soreness all over   Objective   Vitals:   09/24/16 0730 09/24/16 0753 09/24/16 0800 09/24/16 0830  BP: 138/78  (!) 148/78 136/80  Pulse: 79  83 82  Resp: (!) 23  17 (!) 24  Temp:  99.2 F (37.3 C)    TempSrc:      SpO2: 95%  95% (!) 89%  Weight:      Height:        Intake/Output Summary (Last 24 hours) at 09/24/16 0924 Last data filed at 09/24/16 0818  Gross per 24 hour  Intake             1050 ml  Output              627 ml  Net              423 ml    GI Soft, distended, no TTP, -G/R  VASC Palpable B femoral pulses    Laboratory   CBC CBC Latest Ref Rng & Units 09/21/2016 09/19/2016 09/17/2016  WBC 4.0 - 10.5 K/uL 8.0 8.5 8.0  Hemoglobin 13.0 - 17.0 g/dL 0.9(W8.5(L) 8.9(L) 8.4(L)  Hematocrit 39.0 - 52.0 % 27.3(L) 28.9(L) 27.0(L)  Platelets 150 - 400 K/uL 294 267 212    BMET    Component Value Date/Time   NA 133 (L) 09/23/2016 0310   K 4.3 09/23/2016 0310   CL 100 (L) 09/23/2016 0310   CO2 24  09/23/2016 0310   GLUCOSE 104 (H) 09/23/2016 0310   BUN 58 (H) 09/23/2016 0310   CREATININE 4.94 (H) 09/23/2016 0310   CREATININE 1.25 10/25/2013 1512   CALCIUM 8.3 (L) 09/23/2016 0310   GFRNONAA 12 (L) 09/23/2016 0310   GFRNONAA 66 10/25/2013 1512   GFRAA 14 (L) 09/23/2016 0310   GFRAA 77 10/25/2013 1512     Leonides SakeBrian Charly Hunton, MD, FACS [facility] [place] Office: [phone] Pager: [phone]  09/24/2016, 9:24 AM

## 2016-09-24 NOTE — Progress Notes (Signed)
Physical Therapy Discharge Patient Details Name: Austin Simmons MRN: [id number] DOB: 06/13/1960 Today's Date: 09/24/2016 Time:  -     Patient discharged from PT services secondary to patient has refused 3 (three) consecutive times without medical reason.  Please see latest therapy progress note for current level of functioning and progress toward goals.    Progress and discharge plan discussed with patient and/or caregiver: Patient/Caregiver agrees with plan  GP     Elyse HsuVictoria L Aariyah Sampey 09/24/2016, 12:18 PM

## 2016-09-24 NOTE — Progress Notes (Addendum)
TCTS DAILY ICU PROGRESS NOTE                   [address]            [place] [address]          [phone]       Total Length of Stay:  LOS: 8 days   Subjective: He knows he is at [facility]. He is in 4 point restraints as became agitated and uncooperative.  Objective: Vital signs in last 24 hours: Temp:  [97.5 F (36.4 C)-98.6 F (37 C)] 98.5 F (36.9 C) (09/04 0330) Pulse Rate:  [26-78] 75 (09/04 0700) Cardiac Rhythm: Normal sinus rhythm (09/04 0400) Resp:  [15-29] 26 (09/04 0700) BP: (103-148)/(51-86) 136/69 (09/04 0700) SpO2:  [72 %-99 %] 92 % (09/04 0700) Weight:  [197 lb 12 oz (89.7 kg)] 197 lb 12 oz (89.7 kg) (09/04 0330)  Filed Weights   09/21/16 0500 09/22/16 0500 09/24/16 0330  Weight: 198 lb 3.1 oz (89.9 kg) 199 lb 11.8 oz (90.6 kg) 197 lb 12 oz (89.7 kg)       Intake/Output from previous day: 09/03 0701 - 09/04 0700 In: 1408 [P.O.:360; I.V.:1048] Out: 228 [Urine:228]  Intake/Output this shift: No intake/output data recorded.  Current Meds: Scheduled Meds: . calcium carbonate  1 tablet Oral TID WC  . furosemide  80 mg Intravenous BID  . hydrALAZINE  100 mg Oral TID  . isosorbide mononitrate  60 mg Oral Daily  . labetalol  800 mg Oral BID  . levothyroxine  50 mcg Oral QAC breakfast  . LORazepam  1 mg Oral BID  . mouth rinse  15 mL Mouth Rinse BID  . NIFEdipine  90 mg Oral Daily  . pantoprazole  40 mg Oral Daily  . polyethylene glycol  17 g Oral Daily  . spironolactone  25 mg Oral Daily   Continuous Infusions: . sodium chloride    . clevidipine Stopped (09/24/16 0700)   PRN Meds:.sodium chloride, bisacodyl, calcium carbonate, hydrALAZINE, labetalol, LORazepam, oxyCODONE  General appearance: no distress Heart: RRR Lungs: Mostly clear Abdomen: Soft, protuberant, sporadic bowel sounds. When I palpate his abdomen he states it hurts all over. Extremities: No LE edema  Lab Results: CBC:No results for input(s):  WBC, HGB, HCT, PLT in the last 72 hours. BMET:  Recent Labs  09/22/16 0332 09/23/16 0310  NA 134* 133*  K 4.4 4.3  CL 100* 100*  CO2 22 24  GLUCOSE 105* 104*  BUN 54* 58*  CREATININE 4.81* 4.94*  CALCIUM 8.3* 8.3*    CMET: Lab Results  Component Value Date   WBC 8.0 09/21/2016   HGB 8.5 (L) 09/21/2016   HCT 27.3 (L) 09/21/2016   PLT 294 09/21/2016   GLUCOSE 104 (H) 09/23/2016   CHOL 148 04/19/2013   TRIG 118 09/23/2016   HDL 29 (L) 04/19/2013   LDLCALC NOT CALC 04/19/2013   ALT 25 09/21/2016   AST 18 09/21/2016   NA 133 (L) 09/23/2016   K 4.3 09/23/2016   CL 100 (L) 09/23/2016   CREATININE 4.94 (H) 09/23/2016   BUN 58 (H) 09/23/2016   CO2 24 09/23/2016   TSH 30.825 (H) 10/25/2013   INR 0.96 06/14/2016      PT/INR: No results for input(s): LABPROT, INR in the last 72 hours. Radiology: No results found.   Assessment/Plan: 1. CV-SBP better controlled and in the 130's of late. Clevidipine drip stopped this am. On Hydralazine  100 mg tid, Labetalol 800 mg bid, Nifedipine 90 mg daily, Imdur 60 mg daily, and Spironolactone 25 mg daily. 2. Pulmonary-On 2 liters of oxygen via [place]. 3. Acute kidney injury on chronic kidney disease stage III-creatinine yesterday increased to 4.94. Nephrology following. Patient NPO for renal duplex. 4. No need for surgical intervention at this time     Elenore RotaZIMMERMAN,DONIELLE M PA-C 09/24/2016 7:39 AM    I have seen and examined the patient and agree with the assessment and plan as outlined.  [name]' long term prognosis is guarded at best.  Nothing to add at this point.  We will continue to follow intermittently, but please call if specific problems or questions arise.  Purcell Nailslarence H Desirey Keahey, MD 09/24/2016 8:06 AM

## 2016-09-25 ENCOUNTER — Inpatient Hospital Stay (HOSPITAL_COMMUNITY): Payer: Medicare Other

## 2016-09-25 DIAGNOSIS — Z23 Encounter for immunization: Secondary | ICD-10-CM | POA: Diagnosis not present

## 2016-09-25 LAB — POCT I-STAT 3, ART BLOOD GAS (G3+)
ACID-BASE EXCESS: 3 mmol/L — AB (ref 0.0–2.0)
Bicarbonate: 26.1 mmol/L (ref 20.0–28.0)
O2 Saturation: 95 %
PH ART: 7.471 — AB (ref 7.350–7.450)
TCO2: 27 mmol/L (ref 22–32)
pCO2 arterial: 35.8 mmHg (ref 32.0–48.0)
pO2, Arterial: 70 mmHg — ABNORMAL LOW (ref 83.0–108.0)

## 2016-09-25 LAB — BASIC METABOLIC PANEL
Anion gap: 13 (ref 5–15)
BUN: 67 mg/dL — AB (ref 6–20)
CALCIUM: 8.5 mg/dL — AB (ref 8.9–10.3)
CO2: 22 mmol/L (ref 22–32)
CREATININE: 5.76 mg/dL — AB (ref 0.61–1.24)
Chloride: 98 mmol/L — ABNORMAL LOW (ref 101–111)
GFR calc Af Amer: 12 mL/min — ABNORMAL LOW (ref >60)
GFR calc non Af Amer: 10 mL/min — ABNORMAL LOW (ref >60)
Glucose, Bld: 110 mg/dL — ABNORMAL HIGH (ref 65–99)
Potassium: 4.5 mmol/L (ref 3.5–5.1)
Sodium: 133 mmol/L — ABNORMAL LOW (ref 135–145)

## 2016-09-25 LAB — CBC
HEMATOCRIT: 28.6 % — AB (ref 39.0–52.0)
Hemoglobin: 8.9 g/dL — ABNORMAL LOW (ref 13.0–17.0)
MCH: 27.2 pg (ref 26.0–34.0)
MCHC: 31.1 g/dL (ref 30.0–36.0)
MCV: 87.5 fL (ref 78.0–100.0)
PLATELETS: 326 10*3/uL (ref 150–400)
RBC: 3.27 MIL/uL — AB (ref 4.22–5.81)
RDW: 16.4 % — AB (ref 11.5–15.5)
WBC: 9.4 10*3/uL (ref 4.0–10.5)

## 2016-09-25 MED ORDER — SODIUM CHLORIDE 0.9 % IV SOLN
0.2000 ug/kg/h | INTRAVENOUS | Status: DC
  Administered 2016-09-25: 0.7 ug/kg/h via INTRAVENOUS
  Administered 2016-09-25: 0.2 ug/kg/h via INTRAVENOUS
  Filled 2016-09-25 (×3): qty 4

## 2016-09-25 MED ORDER — LABETALOL HCL 300 MG PO TABS
800.0000 mg | ORAL_TABLET | Freq: Three times a day (TID) | ORAL | Status: DC
  Administered 2016-09-25: 800 mg via ORAL
  Filled 2016-09-25: qty 2

## 2016-09-25 MED ORDER — SODIUM CHLORIDE 0.9 % IV SOLN
0.2000 ug/kg/h | INTRAVENOUS | Status: DC
  Filled 2016-09-25 (×2): qty 2

## 2016-09-25 MED ORDER — MIDAZOLAM HCL 2 MG/2ML IJ SOLN
1.0000 mg | INTRAMUSCULAR | Status: DC | PRN
  Administered 2016-09-25 (×2): 1 mg via INTRAVENOUS
  Administered 2016-09-25: 2 mg via INTRAVENOUS
  Filled 2016-09-25 (×5): qty 2

## 2016-09-25 MED ORDER — FUROSEMIDE 10 MG/ML IJ SOLN
120.0000 mg | Freq: Once | INTRAMUSCULAR | Status: AC
  Administered 2016-09-25: 120 mg via INTRAVENOUS
  Filled 2016-09-25: qty 12

## 2016-09-25 NOTE — Progress Notes (Signed)
eLink Physician-Brief Progress Note Patient Name: Austin RivalJames E Vargo DOB: [birth year] MRN: [id number]   Date of Service  09/25/2016  HPI/Events of Note  Agitation with new hypoxia Camera: [t in mild distress, screaming 4 letter words at [employer]  Stat abg, pcxr precedex NOt a good candidate for BIPAP Concern is WD enceph Poor airway protection skills  eICU Interventions       Intervention Category Major Interventions: Hypoxemia - evaluation and management  FEINSTEIN,DANIEL J. 09/25/2016, 12:14 AM

## 2016-09-25 NOTE — Progress Notes (Signed)
eLink Physician-Brief Progress Note Patient Name: Austin RivalJames E Simmons DOB: March 07, 1960 MRN: [id number]   Date of Service  09/25/2016  HPI/Events of Note  ABG re assuring PH Hypoxia with gradient large pcxr edema Renal note: diuresis Repeat and increase lasix to 120  eICU Interventions       Intervention Category Major Interventions: Hypoxemia - evaluation and management  FEINSTEIN,DANIEL J. 09/25/2016, 1:12 AM

## 2016-09-25 NOTE — Progress Notes (Addendum)
  Progress Note  SUBJECTIVE:    Currently in 4 point restraints. Mumbling. Reports chest pain, back pain and leg pain.   OBJECTIVE:   Vitals:   09/25/16 0700 09/25/16 0737  BP: (!) 172/87   Pulse: 88   Resp: (!) 29   Temp:  98 F (36.7 C)  SpO2: 100%     Intake/Output Summary (Last 24 hours) at 09/25/16 0810 Last data filed at 09/25/16 0700  Gross per 24 hour  Intake           1329.1 ml  Output             4200 ml  Net          -2870.9 ml   Agitated in bed in NAD. In 4 point restraints.  Abdomen is moderately distended without significant tenderness to palpation.  Feet are warm and well perfused bilaterally.   ASSESSMENT/PLAN:   56 y.o. male with type B aortic dissection.   Continue blood pressure control.  Creatinine increasing to 5.76 today.  No signs of intestinal or limb malperfusion at this time.  No plan on TEVAR given risk of additional contrast needed for that procedure  Raymond GurneyKimberly A Trinh 09/25/2016 8:10 AM -- LABS:   CBC    Component Value Date/Time   WBC 9.4 09/25/2016 0358   HGB 8.9 (L) 09/25/2016 0358   HCT 28.6 (L) 09/25/2016 0358   PLT 326 09/25/2016 0358    BMET    Component Value Date/Time   NA 133 (L) 09/25/2016 0358   K 4.5 09/25/2016 0358   CL 98 (L) 09/25/2016 0358   CO2 22 09/25/2016 0358   GLUCOSE 110 (H) 09/25/2016 0358   BUN 67 (H) 09/25/2016 0358   CREATININE 5.76 (H) 09/25/2016 0358   CREATININE 1.25 10/25/2013 1512   CALCIUM 8.5 (L) 09/25/2016 0358   GFRNONAA 10 (L) 09/25/2016 0358   GFRNONAA 66 10/25/2013 1512   GFRAA 12 (L) 09/25/2016 0358   GFRAA 77 10/25/2013 1512    COAG Lab Results  Component Value Date   INR 0.96 06/14/2016   INR 0.89 04/07/2014   INR 0.88 06/17/2013   No results found for: PTT  ANTIBIOTICS:   Anti-infectives    None       Maris BergerKimberly Trinh, PA-C [facility] Office: [phone] Pager: [phone] 09/25/2016 8:10 AM  Addendum  I have independently interviewed  and examined the patient, and I agree with the physician assistant's findings.  Renal duplex not surprising some optimal.  There continues to be no evidence of malperfusion of viscera and extremities.  Leonides SakeBrian Vannie Hilgert, MD, FACS [facility] [place] Office: [phone] Pager: [phone]  09/25/2016, 8:45 AM

## 2016-09-25 NOTE — Discharge Summary (Signed)
   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  On his initial physical examination blood pressure was 203/88 w/ a heart rate 103.    Patient was admitted to the intensive care unit with a working diagnosis of hypertensive emergency complicated by a type B aortic dissection.   Hospital Course: I evaluated the patient for the first time earlier on the day that he left AMA.  His extensive history was reviewed in detail.  At the time of my evaluation the patient was sedate and resting comfortably.  Sometime later in the afternoon I was paged by the patient's nurse to inform me that he was awake alert and oriented 3 and demanding to go home.  I was in the middle of end-of-life discussions with a family member on another unit and therefore was not able to present to the bedside.  The nurse reassured me that the patient was alert and oriented to person place time and situation.  There were reportedly multiple family members at the bedside.  I asked the nurse to explain to the patient that if his uncontrolled blood pressure, type B aortic dissection, and rapidly progressive renal failure were not treated appropriately that they would lead to his death.  She assured me she had done so and that the patient continued to insist that he was leaving AGAINST MEDICAL ADVICE.  I informed the patient's nurse  that I would provide him with prescriptions for blood pressure medications.  Unfortunately the patient was apparently not willing to wait for these prescriptions and a short time later I was informed that he had left the hospital.      Medication List    Unable to be finalized as pt LEFT AMA  Day of Discharge Wt Readings from Last 3 Encounters:  09/25/16 86.8 kg (191 lb 5.8 oz)  06/14/16 93.4 kg (206 lb)  05/24/14 93.4 kg (206 lb)   Temp Readings from Last 3 Encounters:  09/25/16 98.5 F (36.9 C) (Oral)  12/28/15 98.6 F (37 C) (Oral)  04/07/14 98.4 F (36.9 C) (Oral)   BP Readings from Last 3 Encounters:  09/25/16 132/70  06/14/16 (!) 176/123  12/29/15 152/86   Pulse Readings from Last 3 Encounters:  09/25/16 80  06/14/16 (!) 102  12/29/15 67    Physical Exam: Exam not able to be completed at time of d/c as pt LEFT AMA  6:20 PM 09/25/16  Lonia BloodJeffrey T. Nikitta Sobiech, MD [facility] Office  [phone] Pager [phone]  On-Call/Text Page:      [email or web address]      password [email or web address]

## 2016-09-25 NOTE — Progress Notes (Signed)
Assessment/ Plan:   1.Acute kidney injury on chronic kidney disease stage ZOX:[id number]:Secondary to contrast nephropathy as well as acute blood pressure lowering required for management of dissecting aortic aneurysm. Getting worse, hold diuretic/consider dialysis in near future 2. Type B acute aortic dissection:continue efforts at titration of antihypertensive therapywith the goal of getting him off of Clevidipinedrip. No plans for intervention at this time for vascular surgery.  3. Hypertension:Improved 4.Anemia:Continue intravenous iron.Will consider ESA after blood pressure control is streamlined. 5.Hyponatremia:Secondary to free water excretion deficit with acute kidney injury and vol xs.  6. Volume overload.  Subjective: Interval History:COnfused  Objective: Vital signs in last 24 hours: Temp:  [98 F (36.7 C)-99 F (37.2 C)] 98.5 F (36.9 C) (09/05 1150) Pulse Rate:  [52-90] 78 (09/05 1330) Resp:  [14-37] 28 (09/05 1230) BP: (106-172)/(45-103) 127/61 (09/05 1330) SpO2:  [82 %-100 %] 93 % (09/05 1330) FiO2 (%):  [55 %] 55 % (09/05 0800) Weight:  [86.8 kg (191 lb 5.8 oz)] 86.8 kg (191 lb 5.8 oz) (09/05 0100) Weight change: -2.9 kg (-6 lb 6.3 oz)  Intake/Output from previous day: 09/04 0701 - 09/05 0700 In: 1329.1 [P.O.:660; I.V.:607.1; IV Piggyback:62] Out: 4200 [Urine:4200] Intake/Output this shift: Total I/O In: 159 [I.V.:159] Out: -   General appearance: confused Resp: clear to auscultation bilaterally Chest wall: no tenderness Cardio: regular rate and rhythm, S1, S2 normal, no murmur, click, rub or gallop GI: protuberant Extremities: edema 1  Lab Results:  Recent Labs  09/25/16 0358  WBC 9.4  HGB 8.9*  HCT 28.6*  PLT 326   BMET:  Recent Labs  09/24/16 1148 09/25/16 0358  NA 132* 133*  K 4.6 4.5  CL 97* 98*  CO2 23 22  GLUCOSE 104* 110*  BUN 64* 67*  CREATININE 5.36* 5.76*  CALCIUM 8.4* 8.5*   No results for input(s): PTH in the last 72  hours. Iron Studies: No results for input(s): IRON, TIBC, TRANSFERRIN, FERRITIN in the last 72 hours. Studies/Results: Dg Chest Port 1 View  Result Date: 09/25/2016 CLINICAL DATA:  Hypoxia EXAM: PORTABLE CHEST 1 VIEW COMPARISON:  09/24/2016 FINDINGS: Shallow inspiration. Cardiac enlargement without vascular congestion. Suggestion of infiltration or atelectasis in the left lung base behind the heart. Right lung is clear. No pneumothorax. Mediastinal contours appear intact. IMPRESSION: Cardiac enlargement. Possible infiltration or atelectasis in the left lung base behind the heart. Electronically Signed   By: Burman NievesWilliam  Stevens M.D.   On: 09/25/2016 01:11   Dg Chest Port 1 View  Result Date: 09/24/2016 CLINICAL DATA:  CHF, ischemic cardiomyopathy, coronary artery disease post MI EXAM: PORTABLE CHEST 1 VIEW COMPARISON:  Portable exam 1708 hours compared to 09/21/2016 FINDINGS: Enlargement of cardiac silhouette. Mediastinal contours and pulmonary vascularity normal. Questionable retrocardiac LEFT lower lobe opacity which could represent atelectasis or infiltrate. Remaining lungs clear. No pleural effusion or pneumothorax. IMPRESSION: Enlargement of cardiac silhouette. Questionable retrocardiac LEFT lower lobe opacity which could represent atelectasis or infiltrate. Electronically Signed   By: Ulyses SouthwardMark  Boles M.D.   On: 09/24/2016 17:27    Scheduled: . calcium carbonate  1 tablet Oral TID WC  . hydrALAZINE  100 mg Oral TID  . isosorbide mononitrate  60 mg Oral Daily  . labetalol  800 mg Oral TID  . levothyroxine  50 mcg Oral QAC breakfast  . mouth rinse  15 mL Mouth Rinse BID  . NIFEdipine  90 mg Oral Daily  . pantoprazole  40 mg Oral Daily  . polyethylene glycol  17 g  Oral Daily      LOS: 9 days   Ilma Achee C 09/25/2016,2:58 PM

## 2016-09-25 NOTE — Progress Notes (Signed)
Paged Tyson AliasFeinstein MD regarding patient's ongoing agitation and confusion with new onset of hypoxia. Orders received. Will carry out.   Marlou PorchBradley Tressie Ragin

## 2016-09-25 NOTE — Significant Event (Signed)
  Overview: Pt left AMA      Pt's son Austin Simmons and significant other in the room.  Pt alert and oriented at this time, speech still very garbled, but somewhat easier to understand.  Family and Pt concerned that Pt is about to be evicted from his home.  According to them, the rent was due 9/3.  Pt adamant that he must leave the hospital to go to the bank to get money to pay the bill.  I discussed with him about trying to find an alternative, or writing a letter for them to take to the landlord, but none of these were acceptable to the pt or family.  Pt can state "i understand I could die".  I reiterated this fact multiple times., but couldn't get him to agree to stay.  Precedex turned off.  Pt able to tell me his name, the year and the president was.  Appears lucid.  Spoke with Dr Sharon SellerMcClung, who could come up with no alternatives given that pt was alert and oriented.  Cleveprex d/Simmons'd, all IV's removed.  Pt dressed with assistance, and family wheeled him out to car.  Per report from the NT that walked out to bring back the wheelchair, pt was very SOB getting in the car and had "blue tint" to lips.  Pt signed AMA form prior to leaving.  AGreeable to taking scheduled BP meds prior to leaving,  Aretta NipMoore, Austin Hevener C, RN

## 2016-09-25 NOTE — Progress Notes (Signed)
Progress Note  Patient Name: Austin Simmons Date of Encounter: 09/25/2016  Primary Cardiologist:   Dr. Anne Fu  Subjective   Increased agitation last night.  This morning he is having decreased responsiveness but this is after pain med.    Inpatient Medications    Scheduled Meds: . calcium carbonate  1 tablet Oral TID WC  . hydrALAZINE  100 mg Oral TID  . isosorbide mononitrate  60 mg Oral Daily  . labetalol  800 mg Oral BID  . levothyroxine  50 mcg Oral QAC breakfast  . mouth rinse  15 mL Mouth Rinse BID  . NIFEdipine  90 mg Oral Daily  . pantoprazole  40 mg Oral Daily  . polyethylene glycol  17 g Oral Daily   Continuous Infusions: . sodium chloride    . clevidipine 4 mg/hr (09/25/16 0700)  . dexmedetomidine (PRECEDEX) IV infusion 0.7 mcg/kg/hr (09/25/16 0700)   PRN Meds: sodium chloride, bisacodyl, calcium carbonate, haloperidol **OR** haloperidol lactate, hydrALAZINE, labetalol, midazolam, oxyCODONE   Vital Signs    Vitals:   09/25/16 0630 09/25/16 0645 09/25/16 0700 09/25/16 0737  BP: (!) 169/86  (!) 172/87   Pulse: 87 87 88   Resp:  20 (!) 29   Temp:    98 F (36.7 C)  TempSrc:    Oral  SpO2: 100% 100% 100%   Weight:      Height:        Intake/Output Summary (Last 24 hours) at 09/25/16 1133 Last data filed at 09/25/16 0700  Gross per 24 hour  Intake           863.43 ml  Output             3650 ml  Net         -2786.57 ml   Filed Weights   09/22/16 0500 09/24/16 0330 09/25/16 0100  Weight: 199 lb 11.8 oz (90.6 kg) 197 lb 12 oz (89.7 kg) 191 lb 5.8 oz (86.8 kg)    Telemetry    NSR with ectopy - Personally Reviewed  ECG    NA - Personally Reviewed  Physical Exam   GEN:  Acutely ill   Neck: No  JVD Cardiac: RRR, no murmurs, rubs, or gallops.  Respiratory: Clear   to auscultation bilaterally. GI: Soft, nontender, non-distended, normal bowel sounds  MS:  Mild diffuse edema Neuro:   Nonfocal.  Not able to cooperate with the exam.    Labs      Chemistry Recent Labs Lab 09/19/16 0742 09/20/16 0241 09/21/16 0334  09/23/16 0310 09/24/16 1148 09/25/16 0358  NA 135 135 136  < > 133* 132* 133*  K 4.3 4.0 3.9  < > 4.3 4.6 4.5  CL 103 104 102  < > 100* 97* 98*  CO2 21* 22 24  < > 24 23 22   GLUCOSE 85 89 100*  < > 104* 104* 110*  BUN 48* 50* 50*  < > 58* 64* 67*  CREATININE 4.28* 4.45* 4.47*  < > 4.94* 5.36* 5.76*  CALCIUM 8.3* 8.4* 8.5*  < > 8.3* 8.4* 8.5*  PROT  --   --  6.3*  --   --   --   --   ALBUMIN 2.6* 2.6* 2.7*  --   --   --   --   AST  --   --  18  --   --   --   --   ALT  --   --  25  --   --   --   --  ALKPHOS  --   --  55  --   --   --   --   BILITOT  --   --  0.5  --   --   --   --   GFRNONAA 14* 14* 14*  < > 12* 11* 10*  GFRAA 17* 16* 16*  < > 14* 13* 12*  ANIONGAP 11 9 10   < > 9 12 13   < > = values in this interval not displayed.   Hematology  Recent Labs Lab 09/19/16 0742 09/21/16 0334 09/25/16 0358  WBC 8.5 8.0 9.4  RBC 3.29* 3.16* 3.27*  HGB 8.9* 8.5* 8.9*  HCT 28.9* 27.3* 28.6*  MCV 87.8 86.4 87.5  MCH 27.1 26.9 27.2  MCHC 30.8 31.1 31.1  RDW 16.6* 16.5* 16.4*  PLT 267 294 326    Cardiac EnzymesNo results for input(s): TROPONINI in the last 168 hours. No results for input(s): TROPIPOC in the last 168 hours.   BNPNo results for input(s): BNP, PROBNP in the last 168 hours.   DDimer No results for input(s): DDIMER in the last 168 hours.   Radiology    Dg Chest Port 1 View  Result Date: 09/25/2016 CLINICAL DATA:  Hypoxia EXAM: PORTABLE CHEST 1 VIEW COMPARISON:  09/24/2016 FINDINGS: Shallow inspiration. Cardiac enlargement without vascular congestion. Suggestion of infiltration or atelectasis in the left lung base behind the heart. Right lung is clear. No pneumothorax. Mediastinal contours appear intact. IMPRESSION: Cardiac enlargement. Possible infiltration or atelectasis in the left lung base behind the heart. Electronically Signed   By: Burman NievesWilliam  Stevens M.D.   On: 09/25/2016 01:11    Dg Chest Port 1 View  Result Date: 09/24/2016 CLINICAL DATA:  CHF, ischemic cardiomyopathy, coronary artery disease post MI EXAM: PORTABLE CHEST 1 VIEW COMPARISON:  Portable exam 1708 hours compared to 09/21/2016 FINDINGS: Enlargement of cardiac silhouette. Mediastinal contours and pulmonary vascularity normal. Questionable retrocardiac LEFT lower lobe opacity which could represent atelectasis or infiltrate. Remaining lungs clear. No pleural effusion or pneumothorax. IMPRESSION: Enlargement of cardiac silhouette. Questionable retrocardiac LEFT lower lobe opacity which could represent atelectasis or infiltrate. Electronically Signed   By: Ulyses SouthwardMark  Boles M.D.   On: 09/24/2016 17:27    Cardiac Studies   Echo  09/17/16   - Left ventricle: The cavity size was normal. There was severe   concentric hypertrophy. Systolic function was mildly to   moderately reduced. The estimated ejection fraction was in the   range of 40% to 45%. Diffuse hypokinesis. There is akinesis of   the inferoseptal myocardium. Doppler parameters are consistent   with abnormal left ventricular relaxation (grade 1 diastolic   dysfunction). - Ventricular septum: The contour showed diastolic flattening. - Aortic valve: Trileaflet; mildly thickened, mildly calcified   leaflets. - Left atrium: The atrium was severely dilated. - Right ventricle: The cavity size was mildly dilated. Wall   thickness was normal. - Right atrium: The atrium was severely dilated.  Impressions:  - Compared to the prior study, there has been no significant   interval change.   Patient Profile     56 y.o. male with past medical history of coronary artery disease, ischemic cardiomyopathy, prior CVA, hypertension, cocaine abuse, noncompliance admitted with hypertensive emergency, type B aortic dissection who we are asked to evaluate for dissection and hypertensive emergency. Patient presented with complaints of 2-3 days of chest, epigastric and  abdominal pain. Initial blood pressure 243/147. Patient had not been taking medications at home. Drug screen positive for  cocaine. CTA reveals type B aortic dissection distal to the left subclavian.   Assessment & Plan    Type B Aortic Dissection:    No indication for surgery at this point.  Continuing to attempt BP control  HTN:  BP is not well controlled.  I will max out the PO labetalol.  Avoiding meds that might lead to significant swings in BP like clonidine for now but we might need to use this.  Try again to wean clevidipine.    CAD:  Medical management.    ISCHEMIC CARDIOMYOPATHY:  He received IV Lasix this morning.    CKD:   Creat continues to climb.  Renal is following.    Signed, Rollene Rotunda, MD  09/25/2016, 11:33 AM

## 2016-09-30 ENCOUNTER — Ambulatory Visit: Payer: Medicare Other | Admitting: Family Medicine

## 2016-12-01 ENCOUNTER — Encounter (HOSPITAL_COMMUNITY): Payer: Self-pay

## 2016-12-01 ENCOUNTER — Emergency Department (HOSPITAL_COMMUNITY): Payer: Medicare Other

## 2016-12-01 ENCOUNTER — Other Ambulatory Visit: Payer: Self-pay

## 2016-12-01 ENCOUNTER — Inpatient Hospital Stay (HOSPITAL_COMMUNITY)
Admission: EM | Admit: 2016-12-01 | Discharge: 2016-12-07 | DRG: 264 | Disposition: A | Discharge status: Home / Self Care | Payer: Medicare Other | Attending: Student in an Organized Health Care Education/Training Program | Admitting: Student in an Organized Health Care Education/Training Program

## 2016-12-01 DIAGNOSIS — M1990 Unspecified osteoarthritis, unspecified site: Secondary | ICD-10-CM | POA: Diagnosis present

## 2016-12-01 DIAGNOSIS — I69322 Dysarthria following cerebral infarction: Secondary | ICD-10-CM

## 2016-12-01 DIAGNOSIS — E876 Hypokalemia: Secondary | ICD-10-CM | POA: Diagnosis not present

## 2016-12-01 DIAGNOSIS — Z6834 Body mass index (BMI) 34.0-34.9, adult: Secondary | ICD-10-CM

## 2016-12-01 DIAGNOSIS — I255 Ischemic cardiomyopathy: Secondary | ICD-10-CM | POA: Diagnosis not present

## 2016-12-01 DIAGNOSIS — Z95828 Presence of other vascular implants and grafts: Secondary | ICD-10-CM

## 2016-12-01 DIAGNOSIS — N432 Other hydrocele: Secondary | ICD-10-CM | POA: Diagnosis not present

## 2016-12-01 DIAGNOSIS — K219 Gastro-esophageal reflux disease without esophagitis: Secondary | ICD-10-CM | POA: Diagnosis not present

## 2016-12-01 DIAGNOSIS — N186 End stage renal disease: Secondary | ICD-10-CM | POA: Diagnosis not present

## 2016-12-01 DIAGNOSIS — F1910 Other psychoactive substance abuse, uncomplicated: Secondary | ICD-10-CM | POA: Diagnosis present

## 2016-12-01 DIAGNOSIS — I69311 Memory deficit following cerebral infarction: Secondary | ICD-10-CM

## 2016-12-01 DIAGNOSIS — Z992 Dependence on renal dialysis: Secondary | ICD-10-CM | POA: Diagnosis not present

## 2016-12-01 DIAGNOSIS — D509 Iron deficiency anemia, unspecified: Secondary | ICD-10-CM | POA: Diagnosis present

## 2016-12-01 DIAGNOSIS — F17210 Nicotine dependence, cigarettes, uncomplicated: Secondary | ICD-10-CM | POA: Diagnosis present

## 2016-12-01 DIAGNOSIS — E039 Hypothyroidism, unspecified: Secondary | ICD-10-CM | POA: Diagnosis present

## 2016-12-01 DIAGNOSIS — E785 Hyperlipidemia, unspecified: Secondary | ICD-10-CM | POA: Diagnosis not present

## 2016-12-01 DIAGNOSIS — Z79890 Hormone replacement therapy (postmenopausal): Secondary | ICD-10-CM | POA: Diagnosis not present

## 2016-12-01 DIAGNOSIS — I7100 Dissection of unspecified site of aorta: Secondary | ICD-10-CM | POA: Diagnosis present

## 2016-12-01 DIAGNOSIS — N179 Acute kidney failure, unspecified: Secondary | ICD-10-CM | POA: Diagnosis present

## 2016-12-01 DIAGNOSIS — D631 Anemia in chronic kidney disease: Secondary | ICD-10-CM | POA: Diagnosis not present

## 2016-12-01 DIAGNOSIS — I5023 Acute on chronic systolic (congestive) heart failure: Secondary | ICD-10-CM | POA: Diagnosis present

## 2016-12-01 DIAGNOSIS — Z791 Long term (current) use of non-steroidal anti-inflammatories (NSAID): Secondary | ICD-10-CM

## 2016-12-01 DIAGNOSIS — I120 Hypertensive chronic kidney disease with stage 5 chronic kidney disease or end stage renal disease: Secondary | ICD-10-CM | POA: Diagnosis not present

## 2016-12-01 DIAGNOSIS — Z9861 Coronary angioplasty status: Secondary | ICD-10-CM | POA: Diagnosis not present

## 2016-12-01 DIAGNOSIS — M79603 Pain in arm, unspecified: Secondary | ICD-10-CM | POA: Diagnosis not present

## 2016-12-01 DIAGNOSIS — F1410 Cocaine abuse, uncomplicated: Secondary | ICD-10-CM | POA: Diagnosis present

## 2016-12-01 DIAGNOSIS — Z79899 Other long term (current) drug therapy: Secondary | ICD-10-CM

## 2016-12-01 DIAGNOSIS — E8770 Fluid overload, unspecified: Secondary | ICD-10-CM | POA: Diagnosis not present

## 2016-12-01 DIAGNOSIS — Z09 Encounter for follow-up examination after completed treatment for conditions other than malignant neoplasm: Secondary | ICD-10-CM

## 2016-12-01 DIAGNOSIS — I878 Other specified disorders of veins: Secondary | ICD-10-CM | POA: Diagnosis not present

## 2016-12-01 DIAGNOSIS — Z880 Allergy status to penicillin: Secondary | ICD-10-CM

## 2016-12-01 DIAGNOSIS — I770 Arteriovenous fistula, acquired: Secondary | ICD-10-CM | POA: Diagnosis not present

## 2016-12-01 DIAGNOSIS — I132 Hypertensive heart and chronic kidney disease with heart failure and with stage 5 chronic kidney disease, or end stage renal disease: Secondary | ICD-10-CM | POA: Diagnosis not present

## 2016-12-01 DIAGNOSIS — I2510 Atherosclerotic heart disease of native coronary artery without angina pectoris: Secondary | ICD-10-CM | POA: Diagnosis present

## 2016-12-01 DIAGNOSIS — R609 Edema, unspecified: Secondary | ICD-10-CM | POA: Diagnosis not present

## 2016-12-01 DIAGNOSIS — M79606 Pain in leg, unspecified: Secondary | ICD-10-CM | POA: Diagnosis not present

## 2016-12-01 DIAGNOSIS — N185 Chronic kidney disease, stage 5: Secondary | ICD-10-CM | POA: Diagnosis not present

## 2016-12-01 DIAGNOSIS — I252 Old myocardial infarction: Secondary | ICD-10-CM

## 2016-12-01 DIAGNOSIS — N5089 Other specified disorders of the male genital organs: Secondary | ICD-10-CM | POA: Diagnosis present

## 2016-12-01 DIAGNOSIS — R7989 Other specified abnormal findings of blood chemistry: Secondary | ICD-10-CM

## 2016-12-01 DIAGNOSIS — Z01810 Encounter for preprocedural cardiovascular examination: Secondary | ICD-10-CM | POA: Diagnosis not present

## 2016-12-01 DIAGNOSIS — N433 Hydrocele, unspecified: Secondary | ICD-10-CM | POA: Diagnosis present

## 2016-12-01 DIAGNOSIS — E669 Obesity, unspecified: Secondary | ICD-10-CM | POA: Diagnosis present

## 2016-12-01 DIAGNOSIS — I5020 Unspecified systolic (congestive) heart failure: Secondary | ICD-10-CM | POA: Diagnosis not present

## 2016-12-01 DIAGNOSIS — Z4682 Encounter for fitting and adjustment of non-vascular catheter: Secondary | ICD-10-CM | POA: Diagnosis not present

## 2016-12-01 DIAGNOSIS — R778 Other specified abnormalities of plasma proteins: Secondary | ICD-10-CM

## 2016-12-01 DIAGNOSIS — R601 Generalized edema: Secondary | ICD-10-CM | POA: Diagnosis not present

## 2016-12-01 DIAGNOSIS — Z7902 Long term (current) use of antithrombotics/antiplatelets: Secondary | ICD-10-CM

## 2016-12-01 DIAGNOSIS — I509 Heart failure, unspecified: Secondary | ICD-10-CM | POA: Diagnosis not present

## 2016-12-01 DIAGNOSIS — E8779 Other fluid overload: Secondary | ICD-10-CM | POA: Diagnosis not present

## 2016-12-01 DIAGNOSIS — N5082 Scrotal pain: Secondary | ICD-10-CM | POA: Diagnosis not present

## 2016-12-01 LAB — I-STAT TROPONIN, ED: Troponin i, poc: 0.11 ng/mL (ref 0.00–0.08)

## 2016-12-01 LAB — COMPREHENSIVE METABOLIC PANEL
ALBUMIN: 2.8 g/dL — AB (ref 3.5–5.0)
ALT: 17 U/L (ref 17–63)
ANION GAP: 12 (ref 5–15)
AST: 20 U/L (ref 15–41)
Alkaline Phosphatase: 60 U/L (ref 38–126)
BUN: 76 mg/dL — ABNORMAL HIGH (ref 6–20)
CHLORIDE: 103 mmol/L (ref 101–111)
CO2: 20 mmol/L — AB (ref 22–32)
Calcium: 8 mg/dL — ABNORMAL LOW (ref 8.9–10.3)
Creatinine, Ser: 6.03 mg/dL — ABNORMAL HIGH (ref 0.61–1.24)
GFR calc non Af Amer: 10 mL/min — ABNORMAL LOW (ref >60)
GFR, EST AFRICAN AMERICAN: 11 mL/min — AB (ref >60)
GLUCOSE: 102 mg/dL — AB (ref 65–99)
POTASSIUM: 3.8 mmol/L (ref 3.5–5.1)
SODIUM: 135 mmol/L (ref 135–145)
Total Bilirubin: 0.5 mg/dL (ref 0.3–1.2)
Total Protein: 6.2 g/dL — ABNORMAL LOW (ref 6.5–8.1)

## 2016-12-01 LAB — URINALYSIS, MICROSCOPIC (REFLEX)
RBC / HPF: NONE SEEN RBC/hpf (ref 0–5)
Squamous Epithelial / LPF: NONE SEEN

## 2016-12-01 LAB — RAPID URINE DRUG SCREEN, HOSP PERFORMED
AMPHETAMINES: NOT DETECTED
BARBITURATES: NOT DETECTED
BENZODIAZEPINES: POSITIVE — AB
COCAINE: POSITIVE — AB
Opiates: POSITIVE — AB
TETRAHYDROCANNABINOL: NOT DETECTED

## 2016-12-01 LAB — CBC WITH DIFFERENTIAL/PLATELET
BASOS PCT: 0 %
Basophils Absolute: 0 10*3/uL (ref 0.0–0.1)
Eosinophils Absolute: 0.2 10*3/uL (ref 0.0–0.7)
Eosinophils Relative: 2 %
HEMATOCRIT: 30.3 % — AB (ref 39.0–52.0)
HEMOGLOBIN: 9.1 g/dL — AB (ref 13.0–17.0)
LYMPHS ABS: 1.3 10*3/uL (ref 0.7–4.0)
LYMPHS PCT: 13 %
MCH: 28.4 pg (ref 26.0–34.0)
MCHC: 30 g/dL (ref 30.0–36.0)
MCV: 94.7 fL (ref 78.0–100.0)
MONOS PCT: 13 %
Monocytes Absolute: 1.3 10*3/uL — ABNORMAL HIGH (ref 0.1–1.0)
NEUTROS ABS: 7.3 10*3/uL (ref 1.7–7.7)
NEUTROS PCT: 72 %
Platelets: 236 10*3/uL (ref 150–400)
RBC: 3.2 MIL/uL — ABNORMAL LOW (ref 4.22–5.81)
RDW: 18.9 % — ABNORMAL HIGH (ref 11.5–15.5)
WBC: 10.1 10*3/uL (ref 4.0–10.5)

## 2016-12-01 LAB — I-STAT CG4 LACTIC ACID, ED
LACTIC ACID, VENOUS: 0.33 mmol/L — AB (ref 0.5–1.9)
Lactic Acid, Venous: 0.39 mmol/L — ABNORMAL LOW (ref 0.5–1.9)

## 2016-12-01 LAB — URINALYSIS, ROUTINE W REFLEX MICROSCOPIC
BILIRUBIN URINE: NEGATIVE
GLUCOSE, UA: NEGATIVE mg/dL
Hgb urine dipstick: NEGATIVE
Ketones, ur: NEGATIVE mg/dL
Leukocytes, UA: NEGATIVE
NITRITE: NEGATIVE
PH: 6 (ref 5.0–8.0)
Protein, ur: 100 mg/dL — AB
SPECIFIC GRAVITY, URINE: 1.025 (ref 1.005–1.030)

## 2016-12-01 LAB — TROPONIN I
TROPONIN I: 0.14 ng/mL — AB (ref <0.03)
Troponin I: 0.17 ng/mL (ref <0.03)

## 2016-12-01 LAB — BRAIN NATRIURETIC PEPTIDE: B Natriuretic Peptide: 2996.1 pg/mL — ABNORMAL HIGH (ref 0.0–100.0)

## 2016-12-01 LAB — MRSA PCR SCREENING: MRSA BY PCR: POSITIVE — AB

## 2016-12-01 LAB — TSH: TSH: 18.898 u[IU]/mL — ABNORMAL HIGH (ref 0.350–4.500)

## 2016-12-01 MED ORDER — FUROSEMIDE 10 MG/ML IJ SOLN
40.0000 mg | Freq: Two times a day (BID) | INTRAMUSCULAR | Status: DC
  Administered 2016-12-01: 40 mg via INTRAVENOUS
  Filled 2016-12-01: qty 4

## 2016-12-01 MED ORDER — ACETAMINOPHEN 650 MG RE SUPP
650.0000 mg | Freq: Four times a day (QID) | RECTAL | Status: DC | PRN
Start: 2016-12-01 — End: 2016-12-07

## 2016-12-01 MED ORDER — ACETAMINOPHEN 500 MG PO TABS
1000.0000 mg | ORAL_TABLET | Freq: Once | ORAL | Status: AC
  Administered 2016-12-01: 1000 mg via ORAL
  Filled 2016-12-01: qty 2

## 2016-12-01 MED ORDER — CLONIDINE HCL 0.1 MG PO TABS
0.1000 mg | ORAL_TABLET | Freq: Two times a day (BID) | ORAL | Status: DC
  Administered 2016-12-01 – 2016-12-03 (×4): 0.1 mg via ORAL
  Filled 2016-12-01 (×4): qty 1

## 2016-12-01 MED ORDER — LIDOCAINE HCL 2 % EX GEL
1.0000 "application " | Freq: Once | CUTANEOUS | Status: DC
  Filled 2016-12-01: qty 5

## 2016-12-01 MED ORDER — CLOPIDOGREL BISULFATE 75 MG PO TABS
75.0000 mg | ORAL_TABLET | Freq: Every day | ORAL | Status: DC
  Administered 2016-12-02: 75 mg via ORAL
  Filled 2016-12-01: qty 1

## 2016-12-01 MED ORDER — MUPIROCIN 2 % EX OINT
1.0000 "application " | TOPICAL_OINTMENT | Freq: Two times a day (BID) | CUTANEOUS | Status: AC
  Administered 2016-12-01 – 2016-12-06 (×10): 1 via NASAL
  Filled 2016-12-01 (×2): qty 22

## 2016-12-01 MED ORDER — CHLORHEXIDINE GLUCONATE CLOTH 2 % EX PADS
6.0000 | MEDICATED_PAD | Freq: Every day | CUTANEOUS | Status: AC
  Administered 2016-12-02 – 2016-12-06 (×5): 6 via TOPICAL

## 2016-12-01 MED ORDER — TRAMADOL HCL 50 MG PO TABS
50.0000 mg | ORAL_TABLET | Freq: Once | ORAL | Status: AC
  Administered 2016-12-01: 50 mg via ORAL
  Filled 2016-12-01: qty 1

## 2016-12-01 MED ORDER — ASPIRIN 81 MG PO CHEW
324.0000 mg | CHEWABLE_TABLET | Freq: Once | ORAL | Status: AC
  Administered 2016-12-01: 324 mg via ORAL
  Filled 2016-12-01: qty 4

## 2016-12-01 MED ORDER — CLONIDINE HCL 0.1 MG PO TABS
0.1000 mg | ORAL_TABLET | Freq: Two times a day (BID) | ORAL | Status: DC
Start: 2016-12-02 — End: 2016-12-01

## 2016-12-01 MED ORDER — PANTOPRAZOLE SODIUM 40 MG PO TBEC
40.0000 mg | DELAYED_RELEASE_TABLET | Freq: Every day | ORAL | Status: DC
  Administered 2016-12-02 – 2016-12-07 (×5): 40 mg via ORAL
  Filled 2016-12-01 (×6): qty 1

## 2016-12-01 MED ORDER — OXYCODONE HCL 5 MG PO TABS
5.0000 mg | ORAL_TABLET | Freq: Four times a day (QID) | ORAL | Status: DC | PRN
  Administered 2016-12-01: 5 mg via ORAL
  Filled 2016-12-01: qty 1

## 2016-12-01 MED ORDER — HEPARIN SODIUM (PORCINE) 5000 UNIT/ML IJ SOLN
5000.0000 [IU] | Freq: Three times a day (TID) | INTRAMUSCULAR | Status: DC
  Administered 2016-12-01 – 2016-12-07 (×14): 5000 [IU] via SUBCUTANEOUS
  Filled 2016-12-01 (×16): qty 1

## 2016-12-01 MED ORDER — ACETAMINOPHEN 325 MG PO TABS
650.0000 mg | ORAL_TABLET | Freq: Four times a day (QID) | ORAL | Status: DC | PRN
  Administered 2016-12-01 – 2016-12-06 (×9): 650 mg via ORAL
  Filled 2016-12-01 (×10): qty 2

## 2016-12-01 MED ORDER — HYDRALAZINE HCL 25 MG PO TABS
25.0000 mg | ORAL_TABLET | Freq: Three times a day (TID) | ORAL | Status: DC
  Administered 2016-12-01 – 2016-12-02 (×5): 25 mg via ORAL
  Filled 2016-12-01 (×5): qty 1

## 2016-12-01 MED ORDER — ATORVASTATIN CALCIUM 80 MG PO TABS
80.0000 mg | ORAL_TABLET | Freq: Every day | ORAL | Status: DC
  Administered 2016-12-01 – 2016-12-06 (×4): 80 mg via ORAL
  Filled 2016-12-01 (×5): qty 1

## 2016-12-01 MED ORDER — LEVOTHYROXINE SODIUM 50 MCG PO TABS
50.0000 ug | ORAL_TABLET | Freq: Every day | ORAL | Status: DC
  Administered 2016-12-01: 50 ug via ORAL
  Filled 2016-12-01 (×2): qty 1

## 2016-12-01 NOTE — ED Notes (Signed)
Pt son came to desk asking that when it comes time for his father to be discharged to call him to come and pick him up [phone]

## 2016-12-01 NOTE — Progress Notes (Signed)
MRSA PCR positive. Standing orders placed.  

## 2016-12-01 NOTE — Progress Notes (Signed)
Pt. Voided small amount on the floor, unable to measure.  Confirmed with MD to still place coude catheter.

## 2016-12-01 NOTE — Progress Notes (Signed)
RN called pharmacy and asked about verification of medications.  Patient arrived to our unit around 1:00pm and medications still not verified so unable to give medications.  Will wait for pharmacy to verify medications.

## 2016-12-01 NOTE — Progress Notes (Signed)
Unable to place coude foley due to access to penis and not able to see urethra to insert foley. Explained to patient and nurse assigned unsuccessful attempt. Recommend urology consult for placement. Pamelia HoitSharp, Kynzlie Hilleary B

## 2016-12-01 NOTE — Progress Notes (Signed)
Patient feels like he has to urinate and can't.  Patient tried sitting on side of bed and standing.  Bladder scan showed .  Attempted in and out cath with 2 nurses and unsucessful.  MD on call paged, new orders received.  Will continue to monitor.

## 2016-12-01 NOTE — Progress Notes (Signed)
Received critical troponin at 0.17.  MD on call made aware. Will continue to monitor.

## 2016-12-01 NOTE — ED Triage Notes (Signed)
Patient complains of 2 days of scrotal swelling and generalized body pain, denies any previous episodes of same. Patient alert and oriented, denies SOB
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
## 2016-12-01 NOTE — ED Notes (Signed)
hospitalists rounding at bedside

## 2016-12-01 NOTE — H&P (Signed)
Date: 12/01/2016               Patient Name:  Austin Simmons MRN: [id number]  DOB: [birth year] Age / Sex: 56 y.o., male   PCP: System, Pcp Not In         Medical Service: Internal Medicine Teaching Service         Attending Physician: Dr. Rogelia BogaButcher, Austin MilesElizabeth A, MD    First Contact: Dr. Caron PresumeHelberg Pager: [phone]  Second Contact: Dr. Antony ContrasGuilloud  Pager: [phone]       After Hours (After 5p/  First Contact Pager: [phone]  weekends / holidays): Second Contact Pager: [phone]   Chief Complaint: SOB  History of Present Illness: History limited by communication barrier. Patient has difficulty communicating due to prior stroke.    Austin Simmons is a 56 y.o. Male with a PMHx significant for HFrEF, CKD Stage V, and 2 prior CVAs who presented with worsening SOB of 4 weeks duration. He states that over the past 4 weeks he has had progressive SOB that has limited his daily activities and mobility. He also endorses orthopnea and chest pain of the same duration. He describes the chest pain an sharp that is centrally located and makes him feel nauseous. He states that he takes all his medications as prescribed; however, he is unsure what he takes. When going through his list he states that he does not take several of his BP medications including losartan and metoprolol. He also states that he has been out of his furosemide and hydralazine for a few days. He endorses abdominal pain, LE edema, and difficulty urinating; however, he denies recent illnesses, palpitations, hematuria.   Meds:  No outpatient medications have been marked as taking for the 12/01/16 encounter [facility](Hospital Encounter).   Allergies: Allergies as of 12/01/2016 - Review Complete 12/01/2016  Allergen Reaction Noted  . Penicillins Rash 01/08/2011   Past Medical History:  Diagnosis Date  . Anxiety   . Arthritis    "all over" (08/11/2012)  . Carotid stenosis    Carotid US (5/15): Bilateral ICA 1-39%  . Chronic lower back pain   .  Coronary artery disease    a. LHC (08/11/12): Mid LAD 90, distal RCA 60-70, EF 30%. PCI: Veriflex (4 x 12 mm) BMS to the LAD.  RCA tx medically. b. cath 04/07/2014 DES to distal RCA, patient left AMA on the same night  . 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 GERD (gastroesophageal reflux disease)   . H/O noncompliance with medical treatment, presenting hazards to health   . Hx of echocardiogram    Echo (5/15): EF 40-45%, normal wall motion, mild LAE  . Hypertension   . Hypertensive cardiomyopathy (HCC)   . Ischemic cardiomyopathy    a.  Myoview (07/2012): Inferior lateral infarct with minimal amount of peri-infarct ischemia, EF 25%.>>> s/p PCI to LAD 7/14 >>> b. Echo (5/15): EF 40-45%  . MI (myocardial infarction) (HCC) 12/2010   cocaine induced/notes 01/18/2011   . Migraines    "I get them q few days" (08/11/2012)  . Polysubstance abuse (HCC)    cocaine and marijuana  . Skull fracture (HCC) 1974   "left me w/this stutter & migraine headaches; had to learn to walk, talk, everything again" (08/11/2012)  . Stroke [facility](HCC) [phone]'s   "memory problems since" (08/11/2012)  . Thyroid disease    Family History:  Father: + PUD, CAD, HTN, CVA  Mother: + Breast Cancer   Social History: Smokes 1/2 PPD  since the age of 26  Denies the use of EtOH and [id number] use   Review of Systems: A complete ROS was negative except as per HPI.   Physical Exam: Blood pressure 138/78, pulse (!) 58, temperature 98.5 F (36.9 C), temperature source Rectal, resp. rate 13, SpO2 96 %.  General: Obese male in no acute distress HENT: Atraumatic, normocephalic, moist mucus membranes, + JVD Pulm: Diffuse to auscultate as the patient was groaning during expiration  CV: RRR, possible S3, no murmurs, no rubs appreciated, +orthopnea  Abdomen: Hypoactive bowel sounds, distended, diffuse tenderness to palpation  Extremities: Severe pitting edema on the LE bilaterally up to the midthigh, warm and dry  Skin: No  new rashes or lesions  Neuro: Alert and oriented, difficulty articulating words.   EKG: personally reviewed: My interpretation is sinus, with normal axis, ST depression in the inferior leads and T wave inversion in V5 and V6. These are stable compared to prior EKGs  CXR: personally reviewed: My interpretation is cardiomegaly with cephalization and increased pulmonary vascular congestion. No infiltrates or opacities.  Assessment & Plan by Problem: Active Problems:   Hypervolemia  1. Volume Overload. Austin Simmons is a 56 y.o. Male with a PMHx significant for HFrEF and CKD stage V who presented with signs and symptoms of volume overload including SOB,  orthopnea, JVD, and severe LE edema. He is unsure of his various medication but does know that he has been out of his hydralazine and furosemide for a few days. His volume overload is likely due to his worsening renal function and lack of furosemide.  - IV Furosemide 40 mg BID  - Troponin elevated to 0.11 in the setting of volume overload and CKD Stage VI - Strict I&O's  - Daily weights - Fluid restriction of 1500 mL per day  - Repeat EKG in the AM - Admit to telemetry   2. HFrEF  - Recent echo with EF of 40-45% with diffuse hypokinesis and G1DD. Other abnormalities noted include severe left atrial enlargment, right ventricular dilation, and right atrial enlargement  - Appear to be perfusing well with warm extremities and a normal lactic acid  - Home medication regimen includes furosemide, atorvastatin, losartan, metoprolol, and spironolactone  - States that he does not take the metoprolol or losartan. Will resume all other medicines. We will need to restart his metoprolol and losartan during the hospitalization.   3. CKD Stage V - Basline creatinine 5-5.0  - Creatine 6.03 today  - Electrolytes are stable but he is volume overload with try to diurese him and call nephrology in the AM - Continue to monitor   4. Hypertension - Home  medication regimen includes clonidine, hydralazine, losartan, metoprolol and spironolactone  - States that he does not take the losartan and metoprolol. These will need restarted.   5. Hypothyroidism  - Continue Levothyroxine 50 mcg daily  - Recheck TSH  Diet: heart healthy + renal with fluid restriction  VTE ppx: SubQ Heparin  Code Status: Full Code  Dispo: Admit patient to Inpatient with expected length of stay greater than 2 midnights.  SignedLevora Dredge: Janaki Exley, MD 12/01/2016, 12:06 PM  My Pager: [phone]

## 2016-12-02 ENCOUNTER — Inpatient Hospital Stay (HOSPITAL_COMMUNITY): Payer: Medicare Other

## 2016-12-02 DIAGNOSIS — R609 Edema, unspecified: Secondary | ICD-10-CM

## 2016-12-02 DIAGNOSIS — N5089 Other specified disorders of the male genital organs: Secondary | ICD-10-CM

## 2016-12-02 DIAGNOSIS — Z8673 Personal history of transient ischemic attack (TIA), and cerebral infarction without residual deficits: Secondary | ICD-10-CM

## 2016-12-02 DIAGNOSIS — N185 Chronic kidney disease, stage 5: Secondary | ICD-10-CM

## 2016-12-02 DIAGNOSIS — N186 End stage renal disease: Secondary | ICD-10-CM

## 2016-12-02 DIAGNOSIS — M79603 Pain in arm, unspecified: Secondary | ICD-10-CM

## 2016-12-02 DIAGNOSIS — Z01810 Encounter for preprocedural cardiovascular examination: Secondary | ICD-10-CM

## 2016-12-02 DIAGNOSIS — R601 Generalized edema: Secondary | ICD-10-CM

## 2016-12-02 DIAGNOSIS — N5082 Scrotal pain: Secondary | ICD-10-CM

## 2016-12-02 DIAGNOSIS — I2510 Atherosclerotic heart disease of native coronary artery without angina pectoris: Secondary | ICD-10-CM

## 2016-12-02 DIAGNOSIS — I255 Ischemic cardiomyopathy: Secondary | ICD-10-CM

## 2016-12-02 DIAGNOSIS — M79606 Pain in leg, unspecified: Secondary | ICD-10-CM

## 2016-12-02 DIAGNOSIS — I5023 Acute on chronic systolic (congestive) heart failure: Secondary | ICD-10-CM

## 2016-12-02 DIAGNOSIS — E669 Obesity, unspecified: Secondary | ICD-10-CM

## 2016-12-02 DIAGNOSIS — Z955 Presence of coronary angioplasty implant and graft: Secondary | ICD-10-CM

## 2016-12-02 DIAGNOSIS — I878 Other specified disorders of veins: Secondary | ICD-10-CM

## 2016-12-02 DIAGNOSIS — Z7982 Long term (current) use of aspirin: Secondary | ICD-10-CM

## 2016-12-02 LAB — HEMOGLOBIN A1C
Hgb A1c MFr Bld: 5.6 % (ref 4.8–5.6)
Mean Plasma Glucose: 114.02 mg/dL

## 2016-12-02 LAB — BASIC METABOLIC PANEL
Anion gap: 13 (ref 5–15)
BUN: 77 mg/dL — ABNORMAL HIGH (ref 6–20)
CO2: 19 mmol/L — ABNORMAL LOW (ref 22–32)
CREATININE: 6.15 mg/dL — AB (ref 0.61–1.24)
Calcium: 8.2 mg/dL — ABNORMAL LOW (ref 8.9–10.3)
Chloride: 105 mmol/L (ref 101–111)
GFR calc Af Amer: 11 mL/min — ABNORMAL LOW (ref >60)
GFR, EST NON AFRICAN AMERICAN: 9 mL/min — AB (ref >60)
GLUCOSE: 85 mg/dL (ref 65–99)
POTASSIUM: 3.6 mmol/L (ref 3.5–5.1)
SODIUM: 137 mmol/L (ref 135–145)

## 2016-12-02 MED ORDER — LEVOTHYROXINE SODIUM 75 MCG PO TABS
75.0000 ug | ORAL_TABLET | Freq: Every day | ORAL | Status: DC
  Administered 2016-12-03 – 2016-12-07 (×5): 75 ug via ORAL
  Filled 2016-12-02 (×5): qty 1

## 2016-12-02 MED ORDER — CLOPIDOGREL BISULFATE 75 MG PO TABS
75.0000 mg | ORAL_TABLET | Freq: Every day | ORAL | Status: DC
  Administered 2016-12-04 – 2016-12-07 (×4): 75 mg via ORAL
  Filled 2016-12-02 (×5): qty 1

## 2016-12-02 MED ORDER — FUROSEMIDE 10 MG/ML IJ SOLN: 80.0000 mg | Freq: Two times a day (BID) | INTRAMUSCULAR | Status: DC

## 2016-12-02 MED ORDER — OXYCODONE HCL 5 MG PO TABS
5.0000 mg | ORAL_TABLET | ORAL | Status: DC | PRN
  Administered 2016-12-02 – 2016-12-03 (×5): 5 mg via ORAL
  Filled 2016-12-02 (×5): qty 1

## 2016-12-02 MED ORDER — FUROSEMIDE 10 MG/ML IJ SOLN
80.0000 mg | Freq: Two times a day (BID) | INTRAMUSCULAR | Status: DC
  Administered 2016-12-02: 80 mg via INTRAVENOUS
  Filled 2016-12-02: qty 8

## 2016-12-02 MED ORDER — FUROSEMIDE 10 MG/ML IJ SOLN
80.0000 mg | Freq: Two times a day (BID) | INTRAMUSCULAR | Status: DC
  Administered 2016-12-02 – 2016-12-03 (×2): 80 mg via INTRAVENOUS
  Filled 2016-12-02 (×2): qty 8
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
## 2016-12-02 NOTE — Progress Notes (Signed)
Date: 12/02/2016  Patient name: Austin RivalJames E Cashwell  Medical record number: [id number]  Date of birth: 1960-09-17   I have seen and evaluated Austin Simmons and discussed their care with the Residency Team. Austin Simmons is a 56 year old man with chronic ischemic cardiomyopathy with systolic failure (ECHO Aug 2018 EF 40%, severe hypertrophy, diffuse hypokinesis, grade 1 diastolic dysfunction), CAD s/p DES to RCA 03/2014, chronic kidney disease stage IV, and history of CVAs 2. He presented to the ED with complaint of 2 days of scrotal swelling and generalized body pain. He has had progressive dyspnea on exertion for the past 4 weeks, orthopnea, chest pain, abdominal pain, lower extremity edema, and difficulty urinating. He states he has been out of his furosemide and hydralazine. On admission, he was found to have a BNP of 3000, diffuse pitting edema, and a chest x-ray consistent with vascular edema. He was diuresed with IV Lasix but weights & I/O are inaccurate.   This morning he states he is in pain all over, particularly in his scrotum. It was challenging getting details this morning. He states he is taking ibuprofen 5-6 times a day. He states he received morphine only once in the past and denies taking it on a daily basis. He states he took 2 or 3 Xanax after a neighbor found him passed out at the door and gave him the Xanax. He adamantly denies taking it daily. He also adamantly denies using cocaine but does endorse frequent over-the-counter antihistamine use. He has [facility].  Vitals:   12/02/16 0019 12/02/16 0518  BP: 135/81 (!) 144/91  Pulse: 63 65  Resp: 20 20  Temp: 98.6 F (37 C) 97.7 F (36.5 C)  SpO2: 94% 93%  Gen : alert, speech challenging to understand, able to speak in full sentences. HRRR no MRG L limited to lateral and anterior - no crackles ABD distended, + BS GU : scrotal swelling, erythema of L scrotum, no satellite lesions, no yeast  odor Anasarca  + mild asterixis  Cr 2.5 in May 2018 Cr now 6.15 Bicarb 19 Gap 13 Trop 0.11 -- 0.17 - 0.14 HgB 9.1 baseline Plts 236 TSH 19 UA + 100 protein UDS + benzo, cocaine, opioids Alb 2.8  I personally viewed the CXR images and confirmed my reading with the official read. 2 view semi erect : rotated, CM, ? edema  I personally viewed the EKG and confirmed my reading with the official read. Sinus, LAE, al axis, inv T inf and lateral, LVH  Assessment and Plan: I have seen and evaluated the patient as outlined above. I agree with the formulated Assessment and Plan as detailed in the residents' note, with the following changes: Austin Simmons is a 56 year old man with ischemic systolic cardiomyopathy EF 40%, CAD, and chronic kidney disease who presents with anasarca. He was diuresed with IV Lasix but his weights and I/O are not accurate so we do not know if he has successfully diuresed.  Due to the diffuse edema, low albumin, and degree of renal impairment, I doubt IV diuresis will be effective. He does not seem to have an acute trigger for the volume overload as his EKG and troponins are negative for ACS, his chest x-ray does not show an infiltrate, he has not been any arrhythmia, and he adamantly denies cocaine use. Therefore, this is likely simply progression of his underlying kidney disease and heart failure. I doubt hhe has nephrotic syndrome as she only has 100  protein on UA.   1. Acute on chronic systolic ischemic HF - increase Lasix. Currently holding beta blocker and ARB. We are checking for metabolites of cocaine as there is a discrepancy between his UDS results and the patient's history. This will be important for home medication regimen and prognosis.   2. The CKD stage V - increase Lasix. Consult nephrology as we may not be able to diurese the patient and he may need renal replacement therapy. He has been educated that he should not take any nonsteroidal.   3. Scrotal erythema  - see if this improves with diuresis. If not, he may need antibiotics to treat his cellulitis. Will consult wound care.  4. Diffuse pain - oxycodone 5 mg when necessary  5.  H/O CAD and CVA - Continue aspirin and statin  6. Unclear substance use history - CIWA,  Urine for Cocaine metabolites   Burns SpainButcher, Jacquese Hackman A, MD 11/12/201810:59 AM

## 2016-12-02 NOTE — Progress Notes (Addendum)
   Subjective: Patient endorses a lot of scrotal, leg and arm pain. States that he is suffering. He is able to void on his own but continues to have significant scrotal swelling. Discussed his positive urine drug screen. He adamantly denies using cocaine, but acknowledges that he used a friends Xanax with his most recent dose on the day of presentation. We discussed that we will try to diurese him but his kidney's are not responding well and he will need to see nephrology. He is ruminating on his pain and would like something stronger that tylenol or tramadol. All questions and concerns addressed.   Objective: Vital signs in last 24 hours: Vitals:   12/01/16 1236 12/01/16 1244 12/01/16 1939 12/02/16 0019  BP:  (!) 174/102 (!) 145/80 135/81  Pulse:  63 (!) 58 63  Resp:  20 20 20   Temp:  98.5 F (36.9 C) 98.4 F (36.9 C) 98.6 F (37 C)  TempSrc:  Oral Oral Oral  SpO2:  97% 95% 94%  Weight: 208 lb 1.6 oz (94.4 kg)     Height: 5\' 5"  (1.651 m)      General: Obese male in no acute distress Pulm: Good air movement with no wheezing or crackles  CV: RRR, + JVD Abdomen: Active bowel sounds, distended, soft, minimal tenderness to palpation  GU: Scrotal swelling that transilluminates with light (hydrocele) and erythema of the left scrotal sac, appears to be macerated  Extremities: Diffuse anasarca   Assessment/Plan:  1. Volume Overload  - In the setting of HFrEF and CKD Stage V  - Has been out of his medications for a few days; however UDS positive for Cocaine, Benzo, and narcotics which could have precipitated the exacerbation  - Troponin has been flat and is likely elevated in the setting of demand and CKD - Difficult voiding and placing a catheter due to penial edema, will continue to monitor  - Continue IV Furosemide 80 mg BID  - Continue strict I&O's, daily weights, and fluid restriction  - Consult nephrology   2. HFrEF  - Recent echo with EF of 40-45% with diffuse hypokinesis and  G1DD. Other abnormalities noted include severe left atrial enlargment, right ventricular dilation, and right atrial enlargement  - Consider holding Metoprolol and Losartan  - Continue furosemide, atorvastatin, and spironolactone   3. CKD Stage V - Basline creatinine 5-5.0  - Creatine 6.03 today - Volume overload with diffuse anasarca and hydrocele  - Will consult nephrology today  4. Hypertension - Continue clonidine, hydralazine and spironolactone  - Consider holding Metoprolol and Losartan   5. Hypothyroidism  - TSH 18.8, undertreated  - Will discuss proper medication use and increase Levothyroxine to 75 mcg  - Recheck in 4-6 weeks   Dispo: Anticipated discharge in approximately >1 day(s).   Levora DredgeHelberg, Lyfe Monger, MD 12/02/2016, 5:02 AM  My Pager: [phone]
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
## 2016-12-02 NOTE — Progress Notes (Signed)
Pt refusing bed alarm tonight. Pt states he has been getting up independently all day to ambulate and use the urinal. Will continue to monitor.
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
## 2016-12-02 NOTE — Progress Notes (Signed)
Upper Extremity Vein Map VASCULAR PRELIM    Cephalic  Segment Diameter Depth Comment  1. Axilla 5.902mm 6.8003mm   2. Mid upper arm 4.7802mm 3.3756mm branch  3. Above AC 3.3115mm 2.8475mm   4. In AC 7.7203mm mm   5. Below AC 5.1229mm 3.3625mm Branch/ partial thrombus  6. Mid forearm 4.6747mm 2.252mm branch  7. Wrist 3.397mm mm    mm mm    mm mm    mm mm    Basilic  Segment Diameter Depth Comment  1. Axilla mm mm   2. Mid upper arm 6.6294mm 9.7886mm   3. Above AC 6.5772mm 7.7294mm   4. In [facility] 8.5832mm 5.7248mm branch  5. Below AC 3.6447mm mm branch  6. Mid forearm mm mm   7. Wrist mm mm    mm mm    mm mm    mm mm    Cephalic  Segment Diameter Depth Comment  1. Axilla 2.6597mm 10.857mm   2. Mid upper arm 2.4105mm 3.5048mm tortuous  3. Above AC 1.5292mm 2.1056mm   4. In AC 2.5578mm mm   5. Below AC mm mm Too small  6. Mid forearm mm mm   7. Wrist mm mm    mm mm    mm mm    mm mm    Basilic  Segment Diameter Depth Comment  1. Axilla mm mm   2. Mid upper arm 7.5525mm 10.537mm   3. Above [facility] 6.7458mm 11.88mm branch  4. In [facility] 5.3555mm mm   5. Below AC 2.474mm 6.2291mm branch  6. Mid forearm mm mm   7. Wrist mm mm    mm mm    mm mm    mm mm    Farrel DemarkJill Eunice, RDMS, RVT

## 2016-12-03 ENCOUNTER — Inpatient Hospital Stay (HOSPITAL_COMMUNITY): Payer: Medicare Other

## 2016-12-03 ENCOUNTER — Inpatient Hospital Stay (HOSPITAL_COMMUNITY): Payer: Medicare Other | Admitting: Certified Registered Nurse Anesthetist

## 2016-12-03 ENCOUNTER — Encounter (HOSPITAL_COMMUNITY)
Admission: EM | Disposition: A | Discharge status: Home / Self Care | Payer: Self-pay | Source: Home / Self Care | Attending: Internal Medicine

## 2016-12-03 ENCOUNTER — Encounter (HOSPITAL_COMMUNITY): Payer: Self-pay | Admitting: Certified Registered Nurse Anesthetist

## 2016-12-03 DIAGNOSIS — N433 Hydrocele, unspecified: Secondary | ICD-10-CM

## 2016-12-03 DIAGNOSIS — N186 End stage renal disease: Secondary | ICD-10-CM

## 2016-12-03 HISTORY — PX: INSERTION OF DIALYSIS CATHETER: SHX1324

## 2016-12-03 HISTORY — PX: AV FISTULA PLACEMENT: SHX1204

## 2016-12-03 LAB — CBC
HCT: 28.6 % — ABNORMAL LOW (ref 39.0–52.0)
Hemoglobin: 8.9 g/dL — ABNORMAL LOW (ref 13.0–17.0)
MCH: 29 pg (ref 26.0–34.0)
MCHC: 31.1 g/dL (ref 30.0–36.0)
MCV: 93.2 fL (ref 78.0–100.0)
Platelets: 229 10*3/uL (ref 150–400)
RBC: 3.07 MIL/uL — ABNORMAL LOW (ref 4.22–5.81)
RDW: 18.8 % — ABNORMAL HIGH (ref 11.5–15.5)
WBC: 7.8 10*3/uL (ref 4.0–10.5)

## 2016-12-03 LAB — BASIC METABOLIC PANEL
ANION GAP: 12 (ref 5–15)
BUN: 79 mg/dL — AB (ref 6–20)
CALCIUM: 8.2 mg/dL — AB (ref 8.9–10.3)
CO2: 22 mmol/L (ref 22–32)
Chloride: 104 mmol/L (ref 101–111)
Creatinine, Ser: 6.13 mg/dL — ABNORMAL HIGH (ref 0.61–1.24)
GFR calc Af Amer: 11 mL/min — ABNORMAL LOW (ref >60)
GFR, EST NON AFRICAN AMERICAN: 9 mL/min — AB (ref >60)
GLUCOSE: 87 mg/dL (ref 65–99)
Potassium: 3.2 mmol/L — ABNORMAL LOW (ref 3.5–5.1)
Sodium: 138 mmol/L (ref 135–145)

## 2016-12-03 LAB — HEPATITIS B SURFACE ANTIGEN: Hepatitis B Surface Ag: NEGATIVE

## 2016-12-03 LAB — ALT: ALT: 17 U/L (ref 17–63)

## 2016-12-03 SURGERY — ARTERIOVENOUS (AV) FISTULA CREATION
Anesthesia: Monitor Anesthesia Care | Site: Arm Lower

## 2016-12-03 MED ORDER — MIDAZOLAM HCL 5 MG/5ML IJ SOLN
INTRAMUSCULAR | Status: DC | PRN
Start: 2016-12-03 — End: 2016-12-03
  Administered 2016-12-03: 2 mg via INTRAVENOUS

## 2016-12-03 MED ORDER — HYDRALAZINE HCL 50 MG PO TABS
50.0000 mg | ORAL_TABLET | Freq: Three times a day (TID) | ORAL | Status: DC
  Administered 2016-12-03 – 2016-12-04 (×2): 50 mg via ORAL
  Filled 2016-12-03 (×2): qty 1

## 2016-12-03 MED ORDER — CEFUROXIME SODIUM 1.5 G IV SOLR
INTRAVENOUS | Status: AC
  Filled 2016-12-03: qty 1.5

## 2016-12-03 MED ORDER — VANCOMYCIN HCL IN DEXTROSE 1-5 GM/200ML-% IV SOLN
1000.0000 mg | Freq: Once | INTRAVENOUS | Status: DC
  Filled 2016-12-03: qty 200

## 2016-12-03 MED ORDER — POTASSIUM CHLORIDE CRYS ER 20 MEQ PO TBCR: 40.0000 meq | EXTENDED_RELEASE_TABLET | Freq: Once | ORAL | Status: DC

## 2016-12-03 MED ORDER — ONDANSETRON HCL 4 MG/2ML IJ SOLN
4.0000 mg | Freq: Once | INTRAMUSCULAR | Status: AC | PRN
  Administered 2016-12-03: 4 mg via INTRAVENOUS

## 2016-12-03 MED ORDER — OXYCODONE HCL 5 MG PO TABS
10.0000 mg | ORAL_TABLET | ORAL | Status: DC | PRN
  Administered 2016-12-03: 10 mg via ORAL
  Filled 2016-12-03: qty 2

## 2016-12-03 MED ORDER — FENTANYL CITRATE (PF) 100 MCG/2ML IJ SOLN: 25.0000 ug | INTRAMUSCULAR | Status: DC | PRN

## 2016-12-03 MED ORDER — HEPARIN SODIUM (PORCINE) 1000 UNIT/ML IJ SOLN
INTRAMUSCULAR | Status: DC | PRN
  Administered 2016-12-03: 1000 [IU]

## 2016-12-03 MED ORDER — MIDAZOLAM HCL 2 MG/2ML IJ SOLN
INTRAMUSCULAR | Status: AC
  Filled 2016-12-03: qty 2

## 2016-12-03 MED ORDER — PHENYLEPHRINE HCL 10 MG/ML IJ SOLN
INTRAVENOUS | Status: DC | PRN
  Administered 2016-12-03: 10 ug/min via INTRAVENOUS

## 2016-12-03 MED ORDER — PROPOFOL 500 MG/50ML IV EMUL
INTRAVENOUS | Status: DC | PRN
  Administered 2016-12-03: 125 ug/kg/min via INTRAVENOUS

## 2016-12-03 MED ORDER — ALTEPLASE 2 MG IJ SOLR: 2.0000 mg | Freq: Once | INTRAMUSCULAR | Status: DC | PRN

## 2016-12-03 MED ORDER — LIDOCAINE HCL 1 % IJ SOLN
INTRAMUSCULAR | Status: DC | PRN
  Administered 2016-12-03: 20 mL
  Administered 2016-12-03: 10 mL

## 2016-12-03 MED ORDER — SODIUM CHLORIDE 0.9 % IV SOLN: 100.0000 mL | INTRAVENOUS | Status: DC | PRN

## 2016-12-03 MED ORDER — SODIUM CHLORIDE 0.9 % IV SOLN
INTRAVENOUS | Status: DC | PRN
  Administered 2016-12-03: 15:00:00 via INTRAVENOUS

## 2016-12-03 MED ORDER — HEPARIN SODIUM (PORCINE) 1000 UNIT/ML IJ SOLN
INTRAMUSCULAR | Status: AC
  Filled 2016-12-03: qty 1

## 2016-12-03 MED ORDER — OXYCODONE HCL 5 MG PO TABS
5.0000 mg | ORAL_TABLET | ORAL | Status: DC | PRN
  Administered 2016-12-03 – 2016-12-07 (×18): 5 mg via ORAL
  Filled 2016-12-03 (×19): qty 1

## 2016-12-03 MED ORDER — 0.9 % SODIUM CHLORIDE (POUR BTL) OPTIME
TOPICAL | Status: DC | PRN
  Administered 2016-12-03: 1000 mL

## 2016-12-03 MED ORDER — SODIUM CHLORIDE 0.9 % IV SOLN
INTRAVENOUS | Status: DC | PRN
  Administered 2016-12-03: 15:00:00 500 mL

## 2016-12-03 MED ORDER — VANCOMYCIN HCL 1000 MG IV SOLR
INTRAVENOUS | Status: DC | PRN
  Administered 2016-12-03: 1000 mg via INTRAVENOUS

## 2016-12-03 MED ORDER — LIDOCAINE HCL 1 % IJ SOLN
INTRAMUSCULAR | Status: AC
  Filled 2016-12-03: qty 40

## 2016-12-03 MED ORDER — HEPARIN SODIUM (PORCINE) 1000 UNIT/ML DIALYSIS
1000.0000 [IU] | INTRAMUSCULAR | Status: DC | PRN
  Filled 2016-12-03: qty 1

## 2016-12-03 SURGICAL SUPPLY — 54 items
ARMBAND PINK RESTRICT EXTREMIT (MISCELLANEOUS) ×4 IMPLANT
BAG DECANTER FOR FLEXI CONT (MISCELLANEOUS) ×4 IMPLANT
BIOPATCH RED 1 DISK 7.0 (GAUZE/BANDAGES/DRESSINGS) ×3 IMPLANT
BIOPATCH RED 1IN DISK 7.0MM (GAUZE/BANDAGES/DRESSINGS) ×1
CANISTER SUCT 3000ML PPV (MISCELLANEOUS) ×4 IMPLANT
CATH PALINDROME RT-P 15FX19CM (CATHETERS) IMPLANT
CATH PALINDROME RT-P 15FX23CM (CATHETERS) ×4 IMPLANT
CATH PALINDROME RT-P 15FX28CM (CATHETERS) IMPLANT
CATH PALINDROME RT-P 15FX55CM (CATHETERS) IMPLANT
CLIP VESOCCLUDE MED 6/CT (CLIP) ×4 IMPLANT
CLIP VESOCCLUDE SM WIDE 6/CT (CLIP) ×4 IMPLANT
COVER PROBE W GEL 5X96 (DRAPES) ×8 IMPLANT
COVER SURGICAL LIGHT HANDLE (MISCELLANEOUS) ×4 IMPLANT
DECANTER SPIKE VIAL GLASS SM (MISCELLANEOUS) ×4 IMPLANT
DERMABOND ADVANCED (GAUZE/BANDAGES/DRESSINGS) ×4
DERMABOND ADVANCED .7 DNX12 (GAUZE/BANDAGES/DRESSINGS) ×4 IMPLANT
DRAPE C-ARM 42X72 X-RAY (DRAPES) ×4 IMPLANT
DRAPE CHEST BREAST 15X10 FENES (DRAPES) ×4 IMPLANT
DRSG COVADERM 4X6 (GAUZE/BANDAGES/DRESSINGS) ×4 IMPLANT
ELECT REM PT RETURN 9FT ADLT (ELECTROSURGICAL) ×4
ELECTRODE REM PT RTRN 9FT ADLT (ELECTROSURGICAL) ×2 IMPLANT
GAUZE SPONGE 4X4 16PLY XRAY LF (GAUZE/BANDAGES/DRESSINGS) ×4 IMPLANT
GLOVE BIO SURGEON STRL SZ7.5 (GLOVE) ×8 IMPLANT
GLOVE BIOGEL PI IND STRL 8.5 (GLOVE) ×6 IMPLANT
GLOVE BIOGEL PI INDICATOR 8.5 (GLOVE) ×6
GLOVE SKINSENSE STRL SZ6.0 (GLOVE) ×8 IMPLANT
GLOVE SS BIOGEL STRL SZ 8 (GLOVE) ×2 IMPLANT
GLOVE SUPERSENSE BIOGEL SZ 8 (GLOVE) ×2
GOWN STRL REUS W/ TWL LRG LVL3 (GOWN DISPOSABLE) ×6 IMPLANT
GOWN STRL REUS W/ TWL XL LVL3 (GOWN DISPOSABLE) ×2 IMPLANT
GOWN STRL REUS W/TWL LRG LVL3 (GOWN DISPOSABLE) ×6
GOWN STRL REUS W/TWL XL LVL3 (GOWN DISPOSABLE) ×2
KIT BASIN OR (CUSTOM PROCEDURE TRAY) ×4 IMPLANT
KIT ROOM TURNOVER OR (KITS) ×4 IMPLANT
NEEDLE 18GX1X1/2 (RX/OR ONLY) (NEEDLE) ×4 IMPLANT
NEEDLE HYPO 25GX1X1/2 BEV (NEEDLE) ×4 IMPLANT
NS IRRIG 1000ML POUR BTL (IV SOLUTION) ×4 IMPLANT
PACK CV ACCESS (CUSTOM PROCEDURE TRAY) ×4 IMPLANT
PACK SURGICAL SETUP 50X90 (CUSTOM PROCEDURE TRAY) ×4 IMPLANT
PAD ARMBOARD 7.5X6 YLW CONV (MISCELLANEOUS) ×8 IMPLANT
SOAP 2 % CHG 4 OZ (WOUND CARE) ×4 IMPLANT
SUT ETHILON 3 0 PS 1 (SUTURE) ×4 IMPLANT
SUT MNCRL AB 4-0 PS2 18 (SUTURE) ×8 IMPLANT
SUT PROLENE 6 0 BV (SUTURE) ×4 IMPLANT
SUT VIC AB 3-0 SH 27 (SUTURE) ×2
SUT VIC AB 3-0 SH 27X BRD (SUTURE) ×2 IMPLANT
SYR 10ML LL (SYRINGE) ×4 IMPLANT
SYR 20CC LL (SYRINGE) ×8 IMPLANT
SYR 5ML LL (SYRINGE) ×4 IMPLANT
SYR CONTROL 10ML LL (SYRINGE) ×4 IMPLANT
TOWEL GREEN STERILE FF (TOWEL DISPOSABLE) ×4 IMPLANT
TOWEL OR 17X26 4PK STRL BLUE (TOWEL DISPOSABLE) ×4 IMPLANT
UNDERPAD 30X30 (UNDERPADS AND DIAPERS) ×4 IMPLANT
WATER STERILE IRR 1000ML POUR (IV SOLUTION) ×4 IMPLANT

## 2016-12-03 NOTE — Progress Notes (Addendum)
   Subjective: Resting comfortably in bed this AM. He continues to express his desire to start HD. We discussed that he will hopefully go for HD catheter placement today and possibly for fistula placement. He continues to have diffuse pain and scrotal pain. We will leave his Oxy IR 5 mg as he has not gotten it every 4 hours. Discussed that IV morphine (which he requests) is not safe given his renal function. We will continue to diurese as much as possible until he is able to go to HD.   Objective: Vital signs in last 24 hours: Vitals:   12/02/16 0019 12/02/16 0518 12/02/16 1457 12/02/16 2104  BP: 135/81 (!) 144/91 (!) 173/87 (!) 165/85  Pulse: 63 65 69 72  Resp: 20 20 20 18   Temp: 98.6 F (37 C) 97.7 F (36.5 C) 98.6 F (37 C) 98.1 F (36.7 C)  TempSrc: Oral Oral Oral Oral  SpO2: 94% 93% 94% 96%  Weight:  226 lb 9.6 oz (102.8 kg)    Height:       General: Obese male in no acute distress Pulm: Good air movement with no wheezing or crackles  CV: RRR, no murmurs, no rubs Abdomen: Active bowels sounds, soft, no tenderness to palpation  GU: Worsening hydrocele  Extremities: LE pitting edema extending up to the hips/pelvis  Assessment/Plan:  1. Volume Overload  - In the setting of HFrEF and CKD Stage V  - Net fluid: - 1.3L - Nephrology was consulted and the patient will go for HD. Vascular surgery onboard. Will get HD catheter and started vein mapping yesterday evening.  - Continue IV Furosemide 80 mg BID  - Continue strict I&O's, daily weights, and fluid restriction    2.HFrEF  - Recent echo with EF of 40-45% with diffuse hypokinesis and G1DD. Other abnormalities noted include severe left atrial enlargment, right ventricular dilation, and right atrial enlargement - Continue holding Metoprolol and Losartan will restart after HD - Continue furosemide, atorvastatin, and spironolactone   3.CKD Stage V - Basline creatinine 5-5.0  - Creatine 6.13 today - Volume overload with  diffuse anasarca and hydrocele  - Nephrology onboard. Plan is to initiate HD  4. Hydrocele with associated maceration of the left scrotum  - Continue to try to decompress with fluid removal. Plan is for HD  - Will continue to hold antibiotics for now  - Wound care consulted to help with offloading. Need to keep the area dry  - Continue Oxy IR 5 mg every 4 hours as needed - Consulted urology to see if they may be able to drain his hydrocele   5. Hypertension - Outpatient pharmacy states that the patient has not filled his medications in several months. Will increase hydralazine to 50 mg TID and stop the clonidine  - Continue holding Metoprolol and Losartan until after HD  6. Hypothyroidism - TSH 18.8, undertreated  - Levothyroxine 75 mcg daily - Recheck in 4-6 weeks   7. Electrolyte Derangements  - Hypokalemia: Replete with 40 mEq PO   Dispo: Anticipated discharge in approximately >1 day(s).   Levora DredgeHelberg, Demont Linford, MD 12/03/2016, 4:58 AM My Pager: [phone]

## 2016-12-03 NOTE — Anesthesia Preprocedure Evaluation (Addendum)
Anesthesia Evaluation  Patient identified by MRN, date of birth, ID band Patient awake    Reviewed: Allergy & Precautions, NPO status , Patient's Chart, lab work & pertinent test results  Airway Mallampati: III  TM Distance: >3 FB Neck ROM: Full    Dental  (+) Dental Advisory Given, Edentulous Upper, Partial Lower, Missing, Poor Dentition   Pulmonary neg pulmonary ROS, former smoker,    Pulmonary exam normal breath sounds clear to auscultation       Cardiovascular hypertension, + angina + CAD, + Past MI (cocaine induced) and + Peripheral Vascular Disease  Normal cardiovascular exam Rhythm:Regular Rate:Normal  EKG - Anterior q waves  TTE 2018 - Severe  concentric LVH. Ejection fraction was in the  range of 40% to 45%. Diffuse hypokinesis. There is akinesis of the inferoseptal myocardium. Grade 1 diastolic   dysfunction. Left atrium was severely dilated. Right ventriclewas mildly dilated. Right atrium was severely dilated.  Carotid US 2015 - 1-39% b/l ICAS   Neuro/Psych  Headaches, Anxiety CVA, Residual Symptoms    GI/Hepatic GERD  Medicated,(+)     substance abuse  cocaine use and marijuana use,   Endo/Other  Hypothyroidism   Renal/GU Renal disease  negative genitourinary   Musculoskeletal  (+) Arthritis ,   Abdominal   Peds  Hematology negative hematology ROS (+)   Anesthesia Other Findings   Reproductive/Obstetrics                            Anesthesia Physical Anesthesia Plan  ASA: IV  Anesthesia Plan: MAC   Post-op Pain Management:    Induction: Intravenous  PONV Risk Score and Plan: 1 and Propofol infusion and Treatment may vary due to age or medical condition  Airway Management Planned: Nasal Cannula  Additional Equipment: None  Intra-op Plan:   Post-operative Plan:   Informed Consent: I have reviewed the patients History and Physical, chart, labs and discussed the  procedure including the risks, benefits and alternatives for the proposed anesthesia with the patient or authorized representative who has indicated his/her understanding and acceptance.   Dental advisory given  Plan Discussed with: CRNA  Anesthesia Plan Comments:         Anesthesia Quick Evaluation

## 2016-12-03 NOTE — Progress Notes (Signed)
Talk to teaching services about pain medication for chest and scrotum pain. MD is aware and has rounded on patient waiting on vascular surgeon. Holding Plavix per md

## 2016-12-03 NOTE — Interval H&P Note (Signed)
   History and Physical Update  The patient was interviewed and re-examined.  The patient's previous History and Physical has been reviewed and is unchanged from Dr Darcella Cheshireain's consult except for: interval arm vein mapping.  There is no change in the plan of care[facility] placement.   Risk, benefits, and alternatives to access surgery were discussed.    The patient is aware the risks include but are not limited to: bleeding, infection, steal syndrome, nerve damage, ischemic monomelic neuropathy, thrombosis, failure to mature, complications related to venous hypertension, need for additional procedures, death and stroke.    The patient is aware the risks of tunneled dialysis catheter placement include but are not limited to: bleeding, infection, central venous injury, pneumothorax, possible venous stenosis, possible malpositioning in the venous system, and possible infections related to long-term catheter presence.   The patient was aware of these risks and agreed to proceed. Leonides Sake.  Brian Chen, MD, FACS [facility] [place] Office: [phone] Pager: [phone]  12/03/2016, 3:03 PM

## 2016-12-03 NOTE — Evaluation (Signed)
Physical Therapy Evaluation Patient Details Name: Austin Simmons MRN: [id number] DOB: 1961/01/13 Today's Date: 12/03/2016   History of Present Illness  Pt is a 56 y.o. male admitted on 12/01/16 with worsening SOB; troponin elevated in setting of volume overload and CKD stage IV. Determined to have progressive renal failure that has accelerated over the past several months. Of note, recent admit (2 mo ago), with severe chest pain with CTA showing acute type B aortic dissection. Will plan for procedure for left UE access on 12/03/16. Pertinent PMH includes CAD, ischemic cardiomyopathy, CVA (residual aphasia), CKD, medical noncompliance and cocaine use.    Clinical Impression  Pt presents with pain and an overall decrease in functional mobility secondary to above. PTA, pt mod indep with SPC and lives with family available for PRN assist. Today, pt able to transfer and amb in room with no AD and supervision for safety; indep with use of urinal and standing at sink. Planning for procedure for LUE access to start HD today. Will continue to follow acutely to maximize functional mobility and independence. Feel pt will be moving better once pain and swelling are under control and will not require follow-up PT services at d/c.     Follow Up Recommendations No PT follow up    Equipment Recommendations  None recommended by PT    Recommendations for Other Services       Precautions / Restrictions Precautions Precautions: Fall Restrictions Weight Bearing Restrictions: No      Mobility  Bed Mobility Overal bed mobility: Independent                Transfers Overall transfer level: Needs assistance Equipment used: None Transfers: Sit to/from Stand Sit to Stand: Supervision         General transfer comment: Able to stand at sink at use urinal with no AD and supervision for safety  Ambulation/Gait Ambulation/Gait assistance: Supervision Ambulation Distance (Feet): 20 Feet Assistive  device: None Gait Pattern/deviations: Step-through pattern;Decreased stride length;Antalgic Gait velocity: Decreased Gait velocity interpretation: <1.8 ft/sec, indicative of risk for recurrent falls General Gait Details: Slow amb in room with no AD and supervision for safety; pt declining further mobility secondary to scrotal pain/swelling  Stairs            Wheelchair Mobility    Modified Rankin (Stroke Patients Only)       Balance Overall balance assessment: Needs assistance   Sitting balance-Leahy Scale: Good     Standing balance support: No upper extremity supported;During functional activity Standing balance-Leahy Scale: Fair Standing balance comment: Able to static stand to use urinal with no UE support                             Pertinent Vitals/Pain Pain Assessment: Faces Faces Pain Scale: Hurts little more Pain Location: Scrotal swelling Pain Descriptors / Indicators: Discomfort;Constant Pain Intervention(s): Limited activity within patient's tolerance;Monitored during session    Home Living Family/patient expects to be discharged to:: Private residence Living Arrangements: Spouse/significant other;Children Available Help at Discharge: Family;Available PRN/intermittently Type of Home: Mobile home Home Access: Stairs to enter Entrance Stairs-Rails: Right;Left;Can reach both Entrance Stairs-Number of Steps: 6 Home Layout: One level Home Equipment: Walker - 2 wheels;Shower seat;Bedside commode;Hand held shower head;Grab bars - tub/shower;Cane - quad;Adaptive equipment Additional Comments: Pt lives with spouse and 2 children who all work    Prior Function Level of Independence: Independent with assistive device(s)  Comments: Pt does not work; wife drives him. Mod indep with SPC     Hand Dominance   Dominant Hand: Right    Extremity/Trunk Assessment   Upper Extremity Assessment Upper Extremity Assessment: Overall WFL for tasks  assessed    Lower Extremity Assessment Lower Extremity Assessment: Overall WFL for tasks assessed       Communication   Communication: Expressive difficulties(residual aphasia(?))  Cognition Arousal/Alertness: Awake/alert Behavior During Therapy: WFL for tasks assessed/performed Overall Cognitive Status: Within Functional Limits for tasks assessed                                        General Comments      Exercises     Assessment/Plan    PT Assessment Patient needs continued PT services  PT Problem List Decreased activity tolerance;Decreased balance;Decreased mobility;Decreased knowledge of use of DME;Pain       PT Treatment Interventions DME instruction;Gait training;Stair training;Functional mobility training;Therapeutic activities;Therapeutic exercise;Balance training;Patient/family education    PT Goals (Current goals can be found in the Care Plan section)  Acute Rehab PT Goals Patient Stated Goal: Decreased pain and swelling PT Goal Formulation: With patient Time For Goal Achievement: 12/17/16 Potential to Achieve Goals: Fair    Frequency Min 3X/week   Barriers to discharge Decreased caregiver support      Co-evaluation               AM-PAC PT "6 Clicks" Daily Activity  Outcome Measure Difficulty turning over in bed (including adjusting bedclothes, sheets and blankets)?: None Difficulty moving from lying on back to sitting on the side of the bed? : None Difficulty sitting down on and standing up from a chair with arms (e.g., wheelchair, bedside commode, etc,.)?: None Help needed moving to and from a bed to chair (including a wheelchair)?: A Little Help needed walking in hospital room?: A Little Help needed climbing 3-5 steps with a railing? : A Little 6 Click Score: 21    End of Session   Activity Tolerance: Patient limited by pain Patient left: in bed;with call bell/phone within reach Nurse Communication: Mobility status PT  Visit Diagnosis: Other abnormalities of gait and mobility (R26.89)    Time: [id number] PT Time Calculation (min) (ACUTE ONLY): 24 min   Charges:   PT Evaluation $PT Eval Moderate Complexity: 1 Mod PT Treatments $Therapeutic Activity: 8-22 mins   PT G Codes:       Ina HomesJaclyn Salem Mastrogiovanni, PT, DPT Acute Rehab Services  Pager: [phone]  Austin Simmons 12/03/2016, 10:18 AM

## 2016-12-03 NOTE — OR Nursing (Signed)
preop patient noted to have enlarged scrotum

## 2016-12-03 NOTE — Transfer of Care (Signed)
Immediate Anesthesia Transfer of Care Note  Patient: Austin Simmons  Procedure(s) Performed: ARTERIOVENOUS (AV) FISTULA CREATION Left Arm (Left Arm Lower) INSERTION OF DIALYSIS CATHETER (N/A )  Patient Location: PACU  Anesthesia Type:MAC  Level of Consciousness: drowsy, patient cooperative and responds to stimulation  Airway & Oxygen Therapy: Patient Spontanous Breathing  Post-op Assessment: Report given to RN and Post -op Vital signs reviewed and stable  Post vital signs: Reviewed and stable  Last Vitals:  Vitals:   12/03/16 1036 12/03/16 1713  BP: (!) 153/95   Pulse: 66   Resp: 18   Temp:  (P) 36.8 C  SpO2: 95%     Last Pain:  Vitals:   12/03/16 1713  TempSrc:   PainSc: (P) Asleep      Patients Stated Pain Goal: 0 (03/49/17 9150)  Complications: No apparent anesthesia complications   Resting with eyes closed.

## 2016-12-03 NOTE — Progress Notes (Signed)
Subjective: Interval History: has complaints of pain with getting up to urinate. Breathing somewhat improved. Swelling in abdomen, scrotum, legs remains the same.  Objective: Vital signs in last 24 hours: Temp:  [98.1 F (36.7 C)-98.7 F (37.1 C)] 98.7 F (37.1 C) (11/13 0653) Pulse Rate:  [69-72] 70 (11/13 0653) Resp:  [18-20] 18 (11/12 2104) BP: (154-173)/(79-87) 154/79 (11/13 0653) SpO2:  [94 %-96 %] 95 % (11/13 0653) Weight:  [221 lb 8 oz (100.5 kg)] 221 lb 8 oz (100.5 kg) (11/13 0653) Weight change: 13 lb 6.4 oz (6.078 kg)  Intake/Output from previous day: 11/12 0701 - 11/13 0700 In: 600 [P.O.:600] Out: 2125 [Urine:2125] Intake/Output this shift: Total I/O In: 240 [P.O.:240] Out: -   General appearance: alert, cooperative and no distress Resp: diminished breath sounds anterior - bilateral Cardio: regular rate and rhythm, S1, S2 normal, no murmur, click, rub or gallop GI: soft, distended, tender diffusely, +BS Male genitalia: severe scrotal swelling Extremities: edema +3 bilaterally Neurologic: Mental status: Alert, oriented, thought content appropriate, speech garbled  Lab Results: Recent Labs    12/01/16 0947 12/03/16 0420  WBC 10.1 7.8  HGB 9.1* 8.9*  HCT 30.3* 28.6*  PLT 236 229   BMET:  Recent Labs    12/02/16 0429 12/03/16 0420  NA 137 138  K 3.6 3.2*  CL 105 104  CO2 19* 22  GLUCOSE 85 87  BUN 77* 79*  CREATININE 6.15* 6.13*  CALCIUM 8.2* 8.2*   No results for input(s): PTH in the last 72 hours. Iron Studies: No results for input(s): IRON, TIBC, TRANSFERRIN, FERRITIN in the last 72 hours.  Studies/Results: No results found.  Scheduled: . atorvastatin  80 mg Oral q1800  . Chlorhexidine Gluconate Cloth  6 each Topical Q0600  . cloNIDine  0.1 mg Oral BID  . clopidogrel  75 mg Oral Q breakfast  . heparin  5,000 Units Subcutaneous Q8H  . hydrALAZINE  25 mg Oral TID  . levothyroxine  75 mcg Oral QAC breakfast  . lidocaine  1 application  Urethral Once  . mupirocin ointment  1 application Nasal BID  . pantoprazole  40 mg Oral Daily  . potassium chloride  40 mEq Oral Once    Assessment/Plan: Pt is a 56 y.o. yo male with a PMHX of  CKDV, CHF, prior CVA x2 with speech deficit, ?substance abuse, hypothyroidism, CAD w/ prior MI (cocaine induced), GERD and migraines, [facility]2016 with shortness of breath and diffuse edema.   1. Acute on chronic kidney disease- patient with excellent response to diuretics, 2.1L UOP yesterday however kidney function still poor with Cr 6.13 and BUN 79. Electrolytes stable. -VVS taking patient for cath/AVF placement today will initiate HD shortly after -hold further diuretics -daily weights, strict I/Os -will initiate CLIP process  2. CHF- most recent echo demonstrates EF 40-45% and diffuse hypokinesis, grade 1 diastolic dysfunction -restart home medications/continue current management  3. Anasarca w/ scrotal edema- HD as above, elevate scrotum  4. HTN- continue current medications  5. Hypothyroidism- agree w/ increasing synthroid  6. Tropinemia- likely demand, monitor for chest pain  7. Positive UDS- + benzo, opiates, cocaine. MI in past 2/2 cocaine use. -discussed with patient he would not be able to continue HD if he uses drugs. He verbalized understanding.  DVT PPX - heparin  Dispo: pending clinical improvement   LOS: 2 days   Tillman SersAngela C Zykee Avakian 12/03/2016,10:31 AM

## 2016-12-03 NOTE — Consult Note (Addendum)
WOC Nurse wound consult note Reason for Consult: Consult requested for scrotum which is very edematous and painful.  There is some partial thickness irritation to the left side in a fold where it is rubbing against his leg.  Currently no blistering or weeping. Dressing procedure/placement/frequency: Xeroform gauze to reduce discomfort where it is rubbing; topical treatment will not be effective to reduce swelling.  Discussed plan of care with primary team;  appearance is consistent with possible hydrocele; recommend ultrasound, and consult to urology service if this is the cause of the edema.   Please re-consult if further assistance is needed.  Thank-you,  Cammie Mcgeeawn Tayona Sarnowski MSN, RN, CWOCN, [place], CNS [phone]

## 2016-12-03 NOTE — Anesthesia Procedure Notes (Signed)
Procedure Name: MAC Date/Time: 12/03/2016 3:31 PM Performed by: Imagene Riches, CRNA Pre-anesthesia Checklist: Patient identified, Emergency Drugs available, Suction available and Patient being monitored Patient Re-evaluated:Patient Re-evaluated prior to induction Oxygen Delivery Method: Circle system utilized

## 2016-12-03 NOTE — Anesthesia Postprocedure Evaluation (Signed)
Anesthesia Post Note  Patient: Austin Simmons  Procedure(s) Performed: ARTERIOVENOUS (AV) FISTULA CREATION Left Arm (Left Arm Lower) INSERTION OF DIALYSIS CATHETER (N/A )     Patient location during evaluation: PACU Anesthesia Type: MAC Level of consciousness: awake, awake and alert and oriented Pain management: pain level controlled Vital Signs Assessment: post-procedure vital signs reviewed and stable Respiratory status: spontaneous breathing, nonlabored ventilation and respiratory function stable Cardiovascular status: blood pressure returned to baseline Anesthetic complications: no    Last Vitals:  Vitals:   12/03/16 1727 12/03/16 1730  BP: 131/75   Pulse: 69   Resp: 10   Temp:  36.8 C  SpO2: 95%     Last Pain:  Vitals:   12/03/16 1730  TempSrc:   PainSc: 0-No pain                 Niketa Turner COKER

## 2016-12-03 NOTE — Discharge Instructions (Signed)
° °  [facility]  Discharge Instructions  AV Fistula or Graft Surgery for Dialysis Access  Please refer to the following instructions for your post-procedure care. Your surgeon or physician assistant will discuss any changes with you.  Activity  You may drive the day following your surgery, if you are comfortable and no longer taking prescription pain medication. Resume full activity as the soreness in your incision resolves.  Bathing/Showering  You may shower after you go home. Keep your incision dry for 48 hours. Do not soak in a bathtub, hot tub, or swim until the incision heals completely. You may not shower if you have a hemodialysis catheter.  Incision Care  Clean your incision with mild soap and water after 48 hours. Pat the area dry with a clean towel. You do not need a bandage unless otherwise instructed. Do not apply any ointments or creams to your incision. You may have skin glue on your incision. Do not peel it off. It will come off on its own in about one week. Your arm may swell a bit after surgery. To reduce swelling use pillows to elevate your arm so it is above your heart. Your doctor will tell you if you need to lightly wrap your arm with an ACE bandage.  Diet  Resume your normal diet. There are not special food restrictions following this procedure. In order to heal from your surgery, it is CRITICAL to get adequate nutrition. Your body requires vitamins, minerals, and protein. Vegetables are the best source of vitamins and minerals. Vegetables also provide the perfect balance of protein. Processed food has little nutritional value, so try to avoid this.  Medications  Resume taking all of your medications. If your incision is causing pain, you may take over-the counter pain relievers such as acetaminophen (Tylenol). If you were prescribed a stronger pain medication, please be aware these medications can cause nausea and constipation. Prevent  nausea by taking the medication with a snack or meal. Avoid constipation by drinking plenty of fluids and eating foods with high amount of fiber, such as fruits, vegetables, and grains. Do not take Tylenol if you are taking prescription pain medications.  Follow up Your surgeon may want to see you in the office following your access surgery. If so, this will be arranged at the time of your surgery.  Please call us immediately for any of the following conditions:  Increased pain, redness, drainage (pus) from your incision site Fever of 101 degrees or higher Severe or worsening pain at your incision site Hand pain or numbness.  Reduce your risk of vascular disease:  Stop smoking. If you would like help, call [facility] at 1-800-QUIT-NOW ((309) 792-21361-660-205-0922) or [place] at (445) [phone]  Manage your cholesterol Maintain a desired weight Control your diabetes Keep your blood pressure down  Dialysis  It will take several weeks to several months for your new dialysis access to be ready for use. Your surgeon will determine when it is OK to use it. Your nephrologist will continue to direct your dialysis. You can continue to use your Permcath until your new access is ready for use.   12/03/2016 Austin Simmons [id number] 08/28/1960  Surgeon(s): Fransisco Hertzhen, Brian L, MD  Procedure(s): ARTERIOVENOUS (AV) FISTULA CREATION Left Arm INSERTION OF DIALYSIS CATHETER  x Do not stick fistula for 12 weeks    If you have any questions, please call the office at [phone].

## 2016-12-03 NOTE — Progress Notes (Signed)
  Date: 12/03/2016  Patient name: Austin Simmons  Medical record number: [id number]  Date of birth: 13-Jul-1960   I have seen and evaluated this patient and I have discussed the plan of care with the house staff. Please see their note for complete details. I concur with their findings with the following additions/corrections: Austin Simmons was seen on AM rounds. He appears to be in no pain but its distressed over the edema and overall pain. Has oxycodone 5 mg when necessary but is not using it as often as it is scheduled. He is down 5 pounds and net -1.5 L. He is to start dialysis once he gets access, to get Simmons tunneled dialysis catheter and either an AV fistula or AV graft today. We adjusted his antihypertensives after the team discovered he has not been picking up his medications. We stopped the clonidine and continue the hydralazine. Hemodialysis will help to lower his pressure that if he is still not at goal, we can increase the hydralazine to add Simmons second agent, he had been on an ARB and beta blocker.  Austin SpainButcher, Jolan Upchurch A, MD 12/03/2016, 1:35 PM

## 2016-12-03 NOTE — Progress Notes (Signed)
  Progress Note    12/03/2016 11:18 AM * No surgery date entered *  Subjective:  No new issues  Vitals:   12/03/16 0653 12/03/16 1036  BP: (!) 154/79 (!) 153/95  Pulse: 70 66  Resp:  18  Temp: 98.7 F (37.1 C)   SpO2: 95% 95%    Physical Exam: aaox3 Non labored respirations Palpable left radial pulse  CBC    Component Value Date/Time   WBC 7.8 12/03/2016 0420   RBC 3.07 (L) 12/03/2016 0420   HGB 8.9 (L) 12/03/2016 0420   HCT 28.6 (L) 12/03/2016 0420   PLT 229 12/03/2016 0420   MCV 93.2 12/03/2016 0420   MCH 29.0 12/03/2016 0420   MCHC 31.1 12/03/2016 0420   RDW 18.8 (H) 12/03/2016 0420   LYMPHSABS 1.3 12/01/2016 0947   MONOABS 1.3 (H) 12/01/2016 0947   EOSABS 0.2 12/01/2016 0947   BASOSABS 0.0 12/01/2016 0947    BMET    Component Value Date/Time   NA 138 12/03/2016 0420   K 3.2 (L) 12/03/2016 0420   CL 104 12/03/2016 0420   CO2 22 12/03/2016 0420   GLUCOSE 87 12/03/2016 0420   BUN 79 (H) 12/03/2016 0420   CREATININE 6.13 (H) 12/03/2016 0420   CREATININE 1.25 10/25/2013 1512   CALCIUM 8.2 (L) 12/03/2016 0420   GFRNONAA 9 (L) 12/03/2016 0420   GFRNONAA 66 10/25/2013 1512   GFRAA 11 (L) 12/03/2016 0420   GFRAA 77 10/25/2013 1512    INR    Component Value Date/Time   INR 0.96 06/14/2016 1147     Intake/Output Summary (Last 24 hours) at 12/03/2016 1118 Last data filed at 12/03/2016 0856 Gross per 24 hour  Intake 600 ml  Output 1825 ml  Net -1225 ml     Assessment:  56 y.o. male is here with esrd in need of access  Plan: OR today for tdc and left arm avf vs avg.    Brandon C. Randie Heinzain, MD [facility] Office: [phone] Pager: [phone]  12/03/2016 11:18 AM

## 2016-12-03 NOTE — Progress Notes (Signed)
Pt is anxious to Have Access for H/D cath placed Vascular was notified and Procedure will be this afternoon. unknown time.

## 2016-12-03 NOTE — Progress Notes (Deleted)
OPERATIVE NOTE  PROCEDURE: 1.  Right internal jugular vein tunneled dialysis catheter placement 2.  Right internal jugular vein cannulation under ultrasound guidance 3.  First stage basilic vein transposition   PRE-OPERATIVE DIAGNOSIS: end-stage renal failure  POST-OPERATIVE DIAGNOSIS: same as above  SURGEON: Adele Barthel, MD  ANESTHESIA: local and MAC  ESTIMATED BLOOD LOSS: 30 cc  FINDING(S): 1.  Tips of the catheter in the right atrium on fluoroscopy 2.  No obvious pneumothorax on fluoroscopy 3.  Faintly palpable radial pulse at end of case 4.  Palpable thrill in arteriovenous fistula at end of case  SPECIMEN(S):  none  INDICATIONS:   Austin Simmons is a 56 y.o. male who presents with hemodialysis dependence.  The patient presents for tunneled dialysis catheter placement.  The patient is aware the risks of tunneled dialysis catheter placement include but are not limited to: bleeding, infection, central venous injury, pneumothorax, possible venous stenosis, possible malpositioning in the venous system, and possible infections related to long-term catheter presence.  The patient was aware of these risks and agreed to proceed.   DESCRIPTION: After written full informed consent was obtained from the patient, the patient was taken back to the operating room.  Prior to induction, the patient was given IV antibiotics.  After obtaining adequate sedation, the patient was prepped and draped in the standard fashion for a chest or neck tunneled dialysis catheter placement.     The cannulation site, the catheter exit site, and tract for the subcutaneous tunnel were then anesthestized with a total of 16 cc of 1% lidocaine without epinephrine.  Under ultrasound guidance, the right internal jugular vein was cannulated with the 18 gauge needle.  A J-wire was then placed down into the inferior vena cava under fluoroscopic guidance.  The wire was then secured in place with a clamp to the drapes.   The tapered wire guide was left occluding the lumen of the needle to avoid air embolism.    I then made stab incisions at the neck and exit sites.   I dissected from the exit site to the cannulation site with a tunneler.   The wire was then unclamped and I removed the needle.  The skin tract and venotomy was dilated serially with graduated dilators, passed under fluoroscopic guidance.   Finally, the dilator-sheath was placed under fluoroscopic guidance into the superior vena cava.  The central dilator and wire were removed.  A 23 cm Palindrome catheter was placed under fluoroscopic guidance down into the right atrium.    The sheath was broken and peeled away while holding the catheter cuff at the level of the skin.  The catheter was clamped with the plastic clamp and the back end of this catheter was transected.  One lumen was docked onto the tunneler.  The catheter was delivered through the subcutaneous tunnel by pulling the tunneler out the exit site.  The catheter was reclamped and was transected a second time, revealing the two lumens of this catheter.  The catheter collar was loaded over the back end of the catheter.  The two  ports were docked onto these two lumens.  The catheter collar was then snapped into place, securing the two ports.  Each port was tested by aspirating and flushing.  No resistance was noted.  Each port was then thoroughly flushed with heparinized saline.  The catheter was secured in placed with two interrupted stitches of 3-0 Nylon tied to the catheter.  The neck incision was closed  with a U-stitch of 4-0 Monocryl.  The neck and chest incision were cleaned.  The closed neck incision was reinforced with Dermabond and sterile bandages applied to the catheter exit site.  Each port was then loaded with concentrated heparin (1000 Units/mL) at the manufacturer recommended volumes to each port.  Sterile caps were applied to each port.    On completion fluoroscopy, the tips of the catheter  were in the right atrium, and there was no evidence of pneumothorax.  At this point, the drapes were taken down.  The patient was repositioned for a left arm access procedure.  He was reprepped and redraped.  Attention was turned to the left arm.  I scanned the left arm with the Sonosite.  There was no usable cephalic vein visualized.  There a cubital vein that was larger than the basilic vein.  I elected to construct a left first stage basilic vein transposition using the cubital vein.  I made a longitudinal incision over the brachial artery.  In the process of dissecting out the brachial artery, I dissected out the cubital vein which appeared compressible with minimal scar tissue surrounding it.  The vein appeared to be externally 3-4 mm in diameter.  I then dissected out the brachial artery proximally and distally.  I placed vessel loops for control.  I ligated the cubital vein distally.  The vein allowed a 5 mm dilator to pass easily.  I clamped the vein proximally and inject heparinized saline.  I made an arteriotomy in the brachial artery.  I injected heparinized saline proximally and distally.  I sewed the vein to the artery in an end-to-side configuration with a running stitch of 6-0 Prolene.  Prior completion, I backbled all vessels.  No thrombus was noted.  I completed the anastomosis in the usual fashion.  There was no bleeding from the suture line.  All vessel loops were removed and clamps removed.  Immediately there was a strong thrill in the cubital vein.  Distally, I could faintly feel a radial pulse.  The wound was washed out and no active bleeding was noted.  The subcutaneous tissue was reapproximated with a running stitch of 3-0 Vicryl.  The skin was reapproximated with a running stitch of 4-0 Monocryl.  The skin was cleaned, dried, and reinforced with Dermabond.   COMPLICATIONS: none  CONDITION: stable   Adele Barthel, MD, [facility] [place] Office:  [phone] Pager: [phone]  12/03/2016, 3:56 PM

## 2016-12-04 ENCOUNTER — Encounter (HOSPITAL_COMMUNITY): Payer: Self-pay | Admitting: Vascular Surgery

## 2016-12-04 ENCOUNTER — Inpatient Hospital Stay (HOSPITAL_COMMUNITY): Payer: Medicare Other

## 2016-12-04 ENCOUNTER — Telehealth: Payer: Self-pay | Admitting: Vascular Surgery

## 2016-12-04 DIAGNOSIS — I770 Arteriovenous fistula, acquired: Secondary | ICD-10-CM

## 2016-12-04 LAB — BASIC METABOLIC PANEL
Anion gap: 10 (ref 5–15)
BUN: 49 mg/dL — AB (ref 6–20)
CALCIUM: 7.9 mg/dL — AB (ref 8.9–10.3)
CO2: 24 mmol/L (ref 22–32)
CREATININE: 4.36 mg/dL — AB (ref 0.61–1.24)
Chloride: 102 mmol/L (ref 101–111)
GFR calc non Af Amer: 14 mL/min — ABNORMAL LOW (ref >60)
GFR, EST AFRICAN AMERICAN: 16 mL/min — AB (ref >60)
GLUCOSE: 109 mg/dL — AB (ref 65–99)
Potassium: 3.2 mmol/L — ABNORMAL LOW (ref 3.5–5.1)
Sodium: 136 mmol/L (ref 135–145)

## 2016-12-04 LAB — HEPATITIS B SURFACE ANTIBODY,QUALITATIVE: Hep B S Ab: NONREACTIVE

## 2016-12-04 LAB — MAGNESIUM: Magnesium: 1.9 mg/dL (ref 1.7–2.4)

## 2016-12-04 LAB — HEPATITIS B CORE ANTIBODY, IGM: Hep B C IgM: NEGATIVE

## 2016-12-04 MED ORDER — HYDRALAZINE HCL 20 MG/ML IJ SOLN
10.0000 mg | Freq: Once | INTRAMUSCULAR | Status: AC
Start: 2016-12-04 — End: 2016-12-04
  Administered 2016-12-04: 10 mg via INTRAVENOUS
  Filled 2016-12-04: qty 1

## 2016-12-04 MED ORDER — NITROGLYCERIN 0.4 MG SL SUBL
SUBLINGUAL_TABLET | SUBLINGUAL | Status: AC
  Filled 2016-12-04: qty 1

## 2016-12-04 MED ORDER — HYDRALAZINE HCL 50 MG PO TABS
75.0000 mg | ORAL_TABLET | Freq: Three times a day (TID) | ORAL | Status: DC
  Administered 2016-12-04: 75 mg via ORAL
  Filled 2016-12-04 (×2): qty 1

## 2016-12-04 NOTE — Plan of Care (Signed)
  Nutrition: Adequate nutrition will be maintained 12/04/2016 [id number] - Not Progressing by Sherian MaroonKing, Araiya Tilmon D, RN Note Pt asking for double portion of food and drinks re-enforced diet restrictions.   Elimination: Will not experience complications related to urinary retention 12/04/2016 0915 - Progressing by Irish LackKing, Bushra Denman D, RN   Skin Integrity: Risk for impaired skin integrity will decrease 12/04/2016 0909 - Progressing by Sherian MaroonKing, Zuri Bradway D, RN   Activity: Capacity to carry out activities will improve 12/04/2016 0915 by Sherian MaroonKing, Oveta Idris D, RN Note Significant scrotum swelling with pain. 12/04/2016 [id number] - Not Progressing by Sherian MaroonKing, Stephaine Breshears D, RN   Cardiac: Ability to achieve and maintain adequate cardiopulmonary perfusion will improve 12/04/2016 0915 - Progressing by Sherian MaroonKing, Jeffie Widdowson D, RN 12/04/2016 [phone] - Progressing by Sherian MaroonKing, Kaislyn Gulas D, RN

## 2016-12-04 NOTE — Telephone Encounter (Signed)
-----   Message from Sharee PimpleMarilyn K McChesney, RN sent at 12/04/2016 12:12 PM EST ----- Regarding: 6-7 weeks  no lab   ----- Message ----- From: Lars Mageollins, Emma M, PA-C Sent: 12/04/2016  11:57 AM To: Vvs Charge Pool  S/P PROCEDURE: 1.  Right internal jugular vein tunneled dialysis catheter placement 2.  Right internal jugular vein cannulation under ultrasound guidance 3.  First stage basilic vein transposition  F/U with Dr. Imogene Burnhen in 6-7 weeks no study needed.  Left UE AV fistula creation.

## 2016-12-04 NOTE — Progress Notes (Signed)
Pt is alert and oriented, blood pressure elevated Medical team aware, gave hydralazine IV blood pressure 178/78.

## 2016-12-04 NOTE — Progress Notes (Signed)
[facility] [place]  Subjective  - Had HD via IJ last night. No left UE complaints.   Objective (!) 207/98 73 99.5 F (37.5 C) (Oral) 18 97%  Intake/Output Summary (Last 24 hours) at 12/04/2016 1155 Last data filed at 12/04/2016 1040 Gross per 24 hour  Intake 1090 ml  Output 3327 ml  Net -2237 ml    Left AV fistula incision healing well with palpable thrill Left radial artery palpable and sensation intact and equal B UE  Assessment/Planning: POD # 1  PROCEDURE: 1.  Right internal jugular vein tunneled dialysis catheter placement 2.  Right internal jugular vein cannulation under ultrasound guidance 3.  First stage basilic vein transposition   F/U with DR. Chen in 6-7 weeks  Thomasena EdisCOLLINS, [facility] 12/04/2016 11:55 AM --  Laboratory Lab Results: Recent Labs    12/03/16 0420  WBC 7.8  HGB 8.9*  HCT 28.6*  PLT 229   BMET Recent Labs    12/03/16 0420 12/04/16 0633  NA 138 136  K 3.2* 3.2*  CL 104 102  CO2 22 24  GLUCOSE 87 109*  BUN 79* 49*  CREATININE 6.13* 4.36*  CALCIUM 8.2* 7.9*    COAG Lab Results  Component Value Date   INR 0.96 06/14/2016   INR 0.89 04/07/2014   INR 0.88 06/17/2013   No results found for: PTT

## 2016-12-04 NOTE — Progress Notes (Addendum)
   Subjective: Patient doing well this AM sitting in his recliner. He is still having scrotal pain but continue to elevate it and ambulate. He went for HD yesterday and tolerate it okay. He wants to continue with HD. States that his wife would like to come up and talk with us. Let him know that when she arrives nursing staff can page us and we are more than happy to come talk with her. Discussed the case with nephrology, he will go for HD today. All questions and concerns addressed.   Objective: Vital signs in last 24 hours: Vitals:   12/03/16 2230 12/03/16 2237 12/03/16 2349 12/04/16 0003  BP: (!) 153/82 (!) 146/82 137/75   Pulse: 71 70 74   Resp:  12 20   Temp:  97.9 F (36.6 C) 98.6 F (37 C)   TempSrc:  Oral Oral   SpO2:  99% 99% 98%  Weight:  212 lb 1.3 oz (96.2 kg)    Height:       General: Obese male in no acute distress Pulm: Good air movement, not using accessory muscles CV: RRR, no murmurs, no rubs Abdomen: Active bowel sounds, soft Extremities: Erythematous incision near the right anterior cubital fossa, LE pitting edema improved from prior exam   Assessment/Plan:  1. Volume Overload  - In the setting of HFrEF and CKD Stage V  - Net fluid: - 3.2L - Patient went for HD catheter placement and stage 1 of fistula placement yesterday evening. He subsequently went for HD with 2L removed. Plan is for HD again today  - Continue strict I&O's, daily weights, and fluid restriction  2.HFrEF  - Recent echo with EF of 40-45% with diffuse hypokinesis and G1DD. Other abnormalities noted include severe left atrial enlargment, right ventricular dilation, and right atrial enlargement -Continue holdingMetoprolol and Losartan will consider restarting once euvolemic  - Continue atorvastatin, and spironolactone  3.CKD Stage V - Volume overload with diffuse anasarca and hydrocele -Nephrology onboard. Patient went for HD catheter placement and stage 1 of fistula placement  yesterday evening. He subsequently went for HD with 2L removed. Plan is for HD again today  4. Hydrocele with associated maceration of the left scrotum  - Will continue to hold antibiotics for now  - Continue Oxy IR 5 mg every 4 hours as needed - Spoke with urology yesterday about draining his hydrocele. They recommended against the procedure at this time as the patient has anasarca and it places him at greater risk for infection and poor wound healing. Recommend scrotal elevation and follow-up as an outpatient.   5. Hypertension - Increased Hydralazine to 75 mg TID  -ContinueholdingMetoprolol and Losartan until the patient is euvolemic   6. Hypothyroidism -TSH 18.8, undertreated  - Levothyroxine 75 mcg daily - Recheck in 4-6 weeks  7. Electrolyte Derangements  - Hypokalemia: Replete with 40 mEq PO   Dispo: Anticipated discharge in approximately >1 day(s).   Levora DredgeHelberg, Ashby Leflore, MD 12/04/2016, 5:15 AM My Pager: [phone]

## 2016-12-04 NOTE — Progress Notes (Signed)
Pt is alert and oriented headed to dialysis. Stated left upper shoulder. Gave PRN

## 2016-12-04 NOTE — Progress Notes (Signed)
Subjective: HD session yesterday, tolerated it well. Still with significant edema in abdomen, legs, scrotum. Breathing has improved.   Objective: Vital signs in last 24 hours: Temp:  [97.4 F (36.3 C)-99.5 F (37.5 C)] 99.5 F (37.5 C) (11/14 0620) Pulse Rate:  [64-76] 73 (11/14 0620) Resp:  [0-20] 18 (11/14 0620) BP: (124-175)/(64-95) 171/89 (11/14 0620) SpO2:  [95 %-99 %] 97 % (11/14 0620) Weight:  [212 lb 1.3 oz (96.2 kg)-219 lb 12.8 oz (99.7 kg)] 213 lb 8 oz (96.8 kg) (11/14 0620) Weight change: -11.2 oz (-0.772 kg)  Intake/Output from previous day: 11/13 0701 - 11/14 0700 In: 970 [P.O.:720; I.V.:250] Out: 2825 [Urine:225; Stool:550; Blood:50] Intake/Output this shift: No intake/output data recorded.  General appearance: alert, cooperative and no distress Resp: diminished breath sounds anterior - bilateral Cardio: regular rate and rhythm, S1, S2 normal, no murmur, click, rub or gallop GI: soft, distended, tender diffusely, +BS Male genitalia: severe scrotal swelling Extremities: edema +3 bilaterally Neurologic: Mental status: Alert, oriented, thought content appropriate, speech garbled  Lab Results: Recent Labs    12/01/16 0947 12/03/16 0420  WBC 10.1 7.8  HGB 9.1* 8.9*  HCT 30.3* 28.6*  PLT 236 229   BMET:  Recent Labs    12/03/16 0420 12/04/16 0633  NA 138 136  K 3.2* 3.2*  CL 104 102  CO2 22 24  GLUCOSE 87 109*  BUN 79* 49*  CREATININE 6.13* 4.36*  CALCIUM 8.2* 7.9*   No results for input(s): PTH in the last 72 hours. Iron Studies: No results for input(s): IRON, TIBC, TRANSFERRIN, FERRITIN in the last 72 hours.  Studies/Results: [place] Scrotum  Result Date: 12/04/2016 CLINICAL DATA:  History of hydrocele EXAM: SCROTAL ULTRASOUND DOPPLER ULTRASOUND OF THE TESTICLES TECHNIQUE: Complete ultrasound examination of the testicles, epididymis, and other scrotal structures was performed. Color and spectral Doppler ultrasound were also utilized to evaluate blood  flow to the testicles. COMPARISON:  None. FINDINGS: Right testicle Measurements: 4.3 x 2.8 x 2.9 cm. No mass or microlithiasis visualized. Left testicle Measurements: 3.8 x 2.8 x 2.5 cm. No mass or microlithiasis visualized. Right epididymis:  Normal in size and appearance. Left epididymis:  Normal in size and appearance. Hydrocele: There small bilateral hydroceles. There is considerable scrotal edema bilaterally. Varicocele:  None visualized. Pulsed Doppler interrogation of both testes demonstrates normal low resistance arterial and venous waveforms bilaterally. IMPRESSION: No evidence of testicular or epididymal masses or torsion or acute inflammation. Small bilateral hydroceles. Large amount of diffuse scrotal edema. Electronically Signed   By: David  SwazilandJordan M.D.   On: 12/04/2016 07:17   Dg Chest Port 1 View  Result Date: 12/03/2016 CLINICAL DATA:  56 year old male status post dialysis catheter placement. EXAM: PORTABLE CHEST 1 VIEW COMPARISON:  Chest radiograph dated 12/01/2016 FINDINGS: A right IJ dialysis catheter is seen with tip at the cavoatrial junction. There is moderate cardiomegaly with similar size cardiopericardial silhouette as the prior radiograph. There is increased vascular congestion since prior study. No pulmonary edema. No pleural effusion or pneumothorax. No focal consolidation. No acute osseous pathology. IMPRESSION: 1. Cardiomegaly with vascular congestion. The degree of vascular congestion is new or worsened compared the prior radiograph. 2. Right IJ dialysis catheter with tip over cavoatrial junction. No pneumothorax. Electronically Signed   By: Elgie CollardArash  Radparvar M.D.   On: 12/03/2016 19:20   Dg Fluoro Guide Cv Line-no Report  Result Date: 12/03/2016 Fluoroscopy was utilized by the requesting physician.  No radiographic interpretation.    Scheduled: . atorvastatin  80  mg Oral q1800  . Chlorhexidine Gluconate Cloth  6 each Topical Q0600  . clopidogrel  75 mg Oral Q breakfast   . heparin  5,000 Units Subcutaneous Q8H  . hydrALAZINE  50 mg Oral TID  . levothyroxine  75 mcg Oral QAC breakfast  . lidocaine  1 application Urethral Once  . mupirocin ointment  1 application Nasal BID  . pantoprazole  40 mg Oral Daily  . potassium chloride  40 mEq Oral Once    Assessment/Plan: Pt is a 56 y.o. yo male with a PMHX of  CKDV, CHF, prior CVA x2 with speech deficit, ?substance abuse, hypothyroidism, CAD w/ prior MI (cocaine induced), GERD and migraines, [facility] with shortness of breath and diffuse edema.   1. ESRD- had tunneled cath placed 11/13 as well as AVF. HD started 11/13, tolerated well with 2.2 liters taken off. Electrolytes stable. Will continue w/ daily HD sessions to help with volume overload -daily weights, strict I/Os -will initiate CLIP process  2. CHF- most recent echo demonstrates EF 40-45% and diffuse hypokinesis, grade 1 diastolic dysfunction -restart home medications/continue current management  3. Anasarca w/ scrotal edema- HD as above, elevate scrotum  4. HTN- stable, continue current medications  5. Hypothyroidism- agree w/ increasing synthroid  6. Tropinemia- likely demand, monitor for chest pain  7. Positive UDS- + benzo, opiates, cocaine. MI in past 2/2 cocaine use. -discussed with patient he would not be able to continue HD if he uses drugs. He verbalized understanding.  DVT PPX - heparin  Dispo: pending clinical improvement, CLIP   LOS: 3 days   Tillman Sersngela C Riccio 12/04/2016,8:48 AM

## 2016-12-04 NOTE — Progress Notes (Signed)
  Date: 12/04/2016  Patient name: Austin Simmons  Medical record number: [id number]  Date of birth: 1960/12/20   I have seen and evaluated this patient and I have discussed the plan of care with the house staff. Please see their note for complete details. I concur with their findings with the following additions/corrections: [name] was seen on AM rounds. Sitting in recliner. C/O pain but not on LUE. Net - 6.7 L and 13 lbs. Had HD yesterday and again today. Tolerating it welll. BP high but hydral increased from 50 to 75 TID today.   Burns SpainButcher, Elizabeth A, MD 12/04/2016, 4:47 PM

## 2016-12-04 NOTE — Progress Notes (Signed)
Physical Therapy Treatment Patient Details Name: Austin RivalJames E Simmons MRN: [id number] DOB: 04/11/1960 Today's Date: 12/04/2016    History of Present Illness Pt is a 56 y.o. male admitted on 12/01/16 with worsening SOB; troponin elevated in setting of volume overload and CKD stage IV. Determined to have progressive renal failure that has accelerated over the past several months. Of note, recent admit (2 mo ago), with severe chest pain with CTA showing acute type B aortic dissection. Pertinent PMH includes CAD, ischemic cardiomyopathy, CVA (residual aphasia), CKD, medical noncompliance and cocaine use. Right IJ vein tunneled dialysis catheter placement on 11/13.   PT Comments    Pt able to transfer and amb to/from bathroom with no AD and supervision for safety. Continues to decline further mobility training and therex secondary to increased scrotal pain and swelling. When asked, pt states he will never be willing to amb further, and once home he plans to only walk to/from bathroom. Educ on importance of mobility, positioning, and fall risk reduction. Pt states he no longer wants to work with PT (MD aware). D/c acute PT.   Follow Up Recommendations  No PT follow up     Equipment Recommendations  None recommended by PT    Recommendations for Other Services       Precautions / Restrictions Precautions Precautions: Fall Restrictions Weight Bearing Restrictions: No    Mobility  Bed Mobility Overal bed mobility: Independent                Transfers Overall transfer level: Independent Equipment used: None Transfers: Sit to/from Stand              Ambulation/Gait Ambulation/Gait assistance: Supervision Ambulation Distance (Feet): 25 Feet Assistive device: None Gait Pattern/deviations: Step-through pattern;Decreased stride length;Antalgic Gait velocity: Decreased Gait velocity interpretation: <1.8 ft/sec, indicative of risk for recurrent falls General Gait Details: Slow amb  in room with no AD and supervision for safety; pt declining further mobility secondary to scrotal pain/swelling   Stairs            Wheelchair Mobility    Modified Rankin (Stroke Patients Only)       Balance Overall balance assessment: Needs assistance   Sitting balance-Leahy Scale: Good     Standing balance support: No upper extremity supported;During functional activity Standing balance-Leahy Scale: Good                              Cognition Arousal/Alertness: Awake/alert Behavior During Therapy: WFL for tasks assessed/performed Overall Cognitive Status: Within Functional Limits for tasks assessed                                        Exercises      General Comments        Pertinent Vitals/Pain Pain Assessment: Faces Faces Pain Scale: Hurts whole lot Pain Location: Scrotal swelling Pain Descriptors / Indicators: Discomfort;Constant Pain Intervention(s): Limited activity within patient's tolerance;Monitored during session;Repositioned    Home Living                      Prior Function            PT Goals (current goals can now be found in the care plan section) Acute Rehab PT Goals Patient Stated Goal: Decreased pain and swelling PT Goal Formulation: All assessment and education complete, DC  therapy Progress towards PT goals: Not progressing toward goals - comment(Unwilling to amb outside of room )    Frequency    Min 3X/week      PT Plan Current plan remains appropriate    Co-evaluation              AM-PAC PT "6 Clicks" Daily Activity  Outcome Measure  Difficulty turning over in bed (including adjusting bedclothes, sheets and blankets)?: None Difficulty moving from lying on back to sitting on the side of the bed? : None Difficulty sitting down on and standing up from a chair with arms (e.g., wheelchair, bedside commode, etc,.)?: None Help needed moving to and from a bed to chair (including a  wheelchair)?: A Little Help needed walking in hospital room?: A Little Help needed climbing 3-5 steps with a railing? : A Little 6 Click Score: 21    End of Session Equipment Utilized During Treatment: Gait belt Activity Tolerance: Patient limited by pain Patient left: in bed;with call bell/phone within reach Nurse Communication: Mobility status PT Visit Diagnosis: Other abnormalities of gait and mobility (R26.89)     Time: [id number] PT Time Calculation (min) (ACUTE ONLY): 20 min  Charges:  $Therapeutic Activity: 8-22 mins                    G Codes:      Austin Simmons, PT, DPT Acute Rehab Services  Pager: [phone]  Austin Simmons 12/04/2016, 10:20 AM

## 2016-12-04 NOTE — Telephone Encounter (Signed)
Sched appt 01/03/17 at 3:45. Lm on hm#.

## 2016-12-05 LAB — CBC
HCT: 28.1 % — ABNORMAL LOW (ref 39.0–52.0)
Hemoglobin: 8.8 g/dL — ABNORMAL LOW (ref 13.0–17.0)
MCH: 29 pg (ref 26.0–34.0)
MCHC: 31.3 g/dL (ref 30.0–36.0)
MCV: 92.7 fL (ref 78.0–100.0)
PLATELETS: 228 10*3/uL (ref 150–400)
RBC: 3.03 MIL/uL — AB (ref 4.22–5.81)
RDW: 18.4 % — AB (ref 11.5–15.5)
WBC: 7.7 10*3/uL (ref 4.0–10.5)

## 2016-12-05 LAB — IRON AND TIBC
Iron: 67 ug/dL (ref 45–182)
SATURATION RATIOS: 20 % (ref 17.9–39.5)
TIBC: 337 ug/dL (ref 250–450)
UIBC: 270 ug/dL

## 2016-12-05 LAB — RENAL FUNCTION PANEL
Albumin: 2.5 g/dL — ABNORMAL LOW (ref 3.5–5.0)
Anion gap: 7 (ref 5–15)
BUN: 23 mg/dL — ABNORMAL HIGH (ref 6–20)
CO2: 28 mmol/L (ref 22–32)
CREATININE: 3.01 mg/dL — AB (ref 0.61–1.24)
Calcium: 8 mg/dL — ABNORMAL LOW (ref 8.9–10.3)
Chloride: 103 mmol/L (ref 101–111)
GFR calc Af Amer: 26 mL/min — ABNORMAL LOW (ref >60)
GFR, EST NON AFRICAN AMERICAN: 22 mL/min — AB (ref >60)
Glucose, Bld: 92 mg/dL (ref 65–99)
Phosphorus: 3.2 mg/dL (ref 2.5–4.6)
Potassium: 3.1 mmol/L — ABNORMAL LOW (ref 3.5–5.1)
SODIUM: 138 mmol/L (ref 135–145)

## 2016-12-05 LAB — FERRITIN: FERRITIN: 129 ng/mL (ref 24–336)

## 2016-12-05 MED ORDER — HYDRALAZINE HCL 20 MG/ML IJ SOLN
INTRAMUSCULAR | Status: AC
  Filled 2016-12-05: qty 1

## 2016-12-05 MED ORDER — ONDANSETRON HCL 4 MG PO TABS: 4.0000 mg | ORAL_TABLET | Freq: Three times a day (TID) | ORAL | Status: DC | PRN

## 2016-12-05 MED ORDER — ONDANSETRON HCL 4 MG/2ML IJ SOLN
4.0000 mg | Freq: Four times a day (QID) | INTRAMUSCULAR | Status: DC | PRN
  Administered 2016-12-05: 4 mg via INTRAVENOUS
  Filled 2016-12-05: qty 2

## 2016-12-05 MED ORDER — LABETALOL HCL 5 MG/ML IV SOLN
5.0000 mg | Freq: Once | INTRAVENOUS | Status: AC
  Administered 2016-12-05: 5 mg via INTRAVENOUS
  Filled 2016-12-05: qty 4

## 2016-12-05 MED ORDER — RAMELTEON 8 MG PO TABS
8.0000 mg | ORAL_TABLET | Freq: Every day | ORAL | Status: DC
  Administered 2016-12-05 – 2016-12-06 (×2): 8 mg via ORAL
  Filled 2016-12-05 (×2): qty 1

## 2016-12-05 MED ORDER — NICOTINE 21 MG/24HR TD PT24
21.0000 mg | MEDICATED_PATCH | Freq: Every day | TRANSDERMAL | Status: DC
  Administered 2016-12-05 – 2016-12-06 (×2): 21 mg via TRANSDERMAL
  Filled 2016-12-05 (×2): qty 1

## 2016-12-05 MED ORDER — ACETAMINOPHEN 325 MG PO TABS
ORAL_TABLET | ORAL | Status: AC
  Filled 2016-12-05: qty 2

## 2016-12-05 MED ORDER — HYDRALAZINE HCL 50 MG PO TABS
100.0000 mg | ORAL_TABLET | Freq: Three times a day (TID) | ORAL | Status: DC
  Administered 2016-12-05 (×2): 100 mg via ORAL
  Filled 2016-12-05 (×2): qty 2

## 2016-12-05 MED ORDER — HYDRALAZINE HCL 20 MG/ML IJ SOLN: 10.0000 mg | Freq: Once | INTRAMUSCULAR | Status: DC

## 2016-12-05 MED ORDER — HYDRALAZINE HCL 50 MG PO TABS
100.0000 mg | ORAL_TABLET | Freq: Three times a day (TID) | ORAL | Status: DC
  Administered 2016-12-06 – 2016-12-07 (×5): 100 mg via ORAL
  Filled 2016-12-05 (×5): qty 2

## 2016-12-05 NOTE — Progress Notes (Signed)
  Date: 12/05/2016  Patient name: Renee RivalJames E Sinopoli  Medical record number: [id number]  Date of birth: December 11, 1960   I have seen and evaluated this patient and I have discussed the plan of care with the house staff. Please see their note for complete details. I concur with their findings with the following additions/corrections: [name] was seen on AM rounds. -11 lbs, -7L. Dr Caron PresumeHelberg titrating up hydral 2/2 inc BP. Anemic HgB 8 with %sat 4, renal considering epo / iron. Will need CTA or MRI in Dec for type B aortic dissection F/U (imaging at 3, 6, 12 months). Not on BB 2/2 cocaine - UDS with metabolites pending to confirm cocaine use as pt adamantly denies and does endorse OTC antihistamine use.  Burns SpainButcher, Enmanuel Zufall A, MD 12/05/2016, 12:59 PM

## 2016-12-05 NOTE — Progress Notes (Signed)
MD on call has been notified of pt's condition of elevated BP and nausea. Currently, pt has no orders for BP or nausea PRN. MD is currently going to review his meds and put some orders. Will continue to monitor

## 2016-12-05 NOTE — Progress Notes (Signed)
Nutrition Education Note  RD intern consulted for a new HD nutrition education. Provided handouts "Fluid Restricted Nutrition Therapy" from the Academy of [employer] Dietetics and "Dietary Guidelines for Adults Starting on HD" from the [employer]. Discussed the importance of limiting sodium, fluid, potassium, and phosphorus. Also talked about eating white bread instead of wheat, in order to limit phosphorus.  Pt typically eats an egg omelette and drinks orange juice for breakfast. For lunch he eats meat loaf with green beans and corn. For dinner he eats meat with veggies and a starch. He snacks on potato chips in the afternoon. Pt cooks for himself. Discussed replacing orange juice in the morning with apple juice to lower potassium. Pt agreed that that was a change he could implement. Reminded pt to continue eating meats and other high-protein foods to keep his blood protein levels stable.  Pt was drowsy at time of visit, but seemed agreeable to monitoring his sodium and fluid intake especially. Pt verbalized that learning the foods with phosphorus and potassium would come with time. Reminded pt that an RD at his HD center would be there to monitor his lab values and educate him with renal diet information.  Expect fair compliance. Current diet order is Renal with 1200 mL fluid restriction, pt is consuming approximately 100% of meals at this time. RD intern contact information provided. If additional nutrition issues arise, please re-consult RD intern.  Wynetta EmeryGrace Herman [facility] Dietetic Intern Pager: [phone] 12/05/2016 10:16 AM

## 2016-12-05 NOTE — Progress Notes (Signed)
   Subjective: Patient much more comfortable this AM. Scrotal swelling significantly decreased. Tolerate HD yesterday without issues. Asking when he can leave. Discussed that we still have fluid to remove and nephrology is working to get outpatient HD set up. He would like to be allowed to ambulate more and is requesting a nicotine patch. Both have been ordered. Interested in when he needs follow-up for his prior aortic dissection. All questions and concerns addressed.   Objective: Vital signs in last 24 hours: Vitals:   12/04/16 1745 12/04/16 1811 12/04/16 1952 12/04/16 2200  BP: (!) 194/99 (!) 193/99 (!) 184/81   Pulse: 88 82 90   Resp: 17 17 18    Temp:  (!) 97.4 F (36.3 C) 98.4 F (36.9 C)   TempSrc:  Oral Oral   SpO2: 99% 100% 99% 99%  Weight:      Height:       General: Well nourished male in no acute distress Pulm: Good air movement with no wheezing or crackles  CV: RRR, no murmurs, no rubs  Abdomen: Active bowel sounds, soft, no tenderness to palpation  GU: Scrotal swelling significantly improved, but hydrocele still present  Extremities: Mild-moderate pitting LE edema to the knees bilaterally   Assessment/Plan:  1. Volume Overload  - In the setting of HFrEF and CKD Stage V - Net fluid: - 7.5L - Patient has now tolerated 2 HD session with 2L of fluid removed during each session   - HD per nephrology  - Continue strict I&O's, daily weights, and fluid restriction  2.HFrEF  - Recent echo with EF of 40-45% with diffuse hypokinesis and G1DD. Other abnormalities noted include severe left atrial enlargment, right ventricular dilation, and right atrial enlargement -Continue holdingMetoprolol and Losartanwill consider restarting once euvolemic  - Continue atorvastatin  3.ESRD - New to HD  - Has tolerated 2 HD sessions with 2L of fluid removed during each session  - Iron Saturation 4% with decreased ferritin, Hgb 8.8. He is a candidate for Epo but needs Iron  supplementation. Will discuss with nephrology and defer to them management during HD - Nephrology working to get patient coordinated with outpatient HD - HD per nephro   4. Hydrocele with associated maceration of the left scrotum  -Significantly improved with HD - ContinueOxy IR5 mg every 4 hours as needed - Continue to elevate the scrotum and ambulate  - Will follow up with urology as outpatient   5. Hypertension - Increased Hydralazine to 100 mg TID -ContinueholdingMetoprolol and Losartanuntil the patient is euvolemic   6. Hypothyroidism -TSH 18.8, undertreated  - Levothyroxine 75 mcgdaily - Recheck in 4-6 weeks  7. Electrolyte Derangements - Hypokalemia:   Dispo: Anticipated discharge in approximately >1 day(s).   Levora DredgeHelberg, Sophiya Morello, MD 12/05/2016, 5:12 AM My Pager: [phone]

## 2016-12-05 NOTE — Progress Notes (Signed)
Patient came back from HD; BP still elevated. Per HD RN states pt was given IV hydralazine after HD. MD notified.

## 2016-12-05 NOTE — Progress Notes (Signed)
Subjective: Had second HD yesterday, does feel improved. Swelling in scrotum has gone down some. He is uncomfortable laying in bed for 4 hours to get HD, might want to try chair instead for today's session.   Objective: Vital signs in last 24 hours: Temp:  [97.4 F (36.3 C)-98.4 F (36.9 C)] 98.2 F (36.8 C) (11/15 0649) Pulse Rate:  [78-90] 80 (11/15 0649) Resp:  [16-20] 20 (11/15 0649) BP: (172-209)/(75-116) 172/99 (11/15 0649) SpO2:  [96 %-100 %] 96 % (11/15 0649) Weight:  [202 lb 9.6 oz (91.9 kg)] 202 lb 9.6 oz (91.9 kg) (11/15 0649) Weight change: -3.2 oz (-7.801 kg)  Intake/Output from previous day: 11/14 0701 - 11/15 0700 In: 720 [P.O.:720] Out: 4862 [Urine:861; Stool:1] Intake/Output this shift: No intake/output data recorded.  General appearance: alert, cooperative and no distress Resp: diminished breath sounds anterior - bilateral Cardio: regular rate and rhythm, S1, S2 normal, no murmur, click, rub or gallop GI: soft, distended, tender diffusely, +BS Male genitalia: severe scrotal swelling Extremities: edema +3 bilaterally Neurologic: Mental status: Alert, oriented, thought content appropriate, speech garbled  Lab Results: Recent Labs    12/03/16 0420 12/05/16 0554  WBC 7.8 7.7  HGB 8.9* 8.8*  HCT 28.6* 28.1*  PLT 229 228   BMET:  Recent Labs    12/04/16 0633 12/05/16 0554  NA 136 138  K 3.2* 3.1*  CL 102 103  CO2 24 28  GLUCOSE 109* 92  BUN 49* 23*  CREATININE 4.36* 3.01*  CALCIUM 7.9* 8.0*   No results for input(s): PTH in the last 72 hours. Iron Studies: No results for input(s): IRON, TIBC, TRANSFERRIN, FERRITIN in the last 72 hours.  Studies/Results: [place] Scrotum  Result Date: 12/04/2016 CLINICAL DATA:  History of hydrocele EXAM: SCROTAL ULTRASOUND DOPPLER ULTRASOUND OF THE TESTICLES TECHNIQUE: Complete ultrasound examination of the testicles, epididymis, and other scrotal structures was performed. Color and spectral Doppler ultrasound were  also utilized to evaluate blood flow to the testicles. COMPARISON:  None. FINDINGS: Right testicle Measurements: 4.3 x 2.8 x 2.9 cm. No mass or microlithiasis visualized. Left testicle Measurements: 3.8 x 2.8 x 2.5 cm. No mass or microlithiasis visualized. Right epididymis:  Normal in size and appearance. Left epididymis:  Normal in size and appearance. Hydrocele: There small bilateral hydroceles. There is considerable scrotal edema bilaterally. Varicocele:  None visualized. Pulsed Doppler interrogation of both testes demonstrates normal low resistance arterial and venous waveforms bilaterally. IMPRESSION: No evidence of testicular or epididymal masses or torsion or acute inflammation. Small bilateral hydroceles. Large amount of diffuse scrotal edema. Electronically Signed   By: David  SwazilandJordan M.D.   On: 12/04/2016 07:17   Dg Chest Port 1 View  Result Date: 12/03/2016 CLINICAL DATA:  56 year old male status post dialysis catheter placement. EXAM: PORTABLE CHEST 1 VIEW COMPARISON:  Chest radiograph dated 12/01/2016 FINDINGS: A right IJ dialysis catheter is seen with tip at the cavoatrial junction. There is moderate cardiomegaly with similar size cardiopericardial silhouette as the prior radiograph. There is increased vascular congestion since prior study. No pulmonary edema. No pleural effusion or pneumothorax. No focal consolidation. No acute osseous pathology. IMPRESSION: 1. Cardiomegaly with vascular congestion. The degree of vascular congestion is new or worsened compared the prior radiograph. 2. Right IJ dialysis catheter with tip over cavoatrial junction. No pneumothorax. Electronically Signed   By: Elgie CollardArash  Radparvar M.D.   On: 12/03/2016 19:20   Dg Fluoro Guide Cv Line-no Report  Result Date: 12/03/2016 Fluoroscopy was utilized by the requesting physician.  No radiographic interpretation.    Scheduled: . atorvastatin  80 mg Oral q1800  . Chlorhexidine Gluconate Cloth  6 each Topical Q0600  .  clopidogrel  75 mg Oral Q breakfast  . heparin  5,000 Units Subcutaneous Q8H  . hydrALAZINE  100 mg Oral Q8H  . levothyroxine  75 mcg Oral QAC breakfast  . lidocaine  1 application Urethral Once  . mupirocin ointment  1 application Nasal BID  . nicotine  21 mg Transdermal Daily  . pantoprazole  40 mg Oral Daily  . potassium chloride  40 mEq Oral Once    Assessment/Plan: Pt is a 56 y.o. yo male with a PMHX of  CKDV, CHF, prior CVA x2 with speech deficit, ?substance abuse, hypothyroidism, CAD w/ prior MI (cocaine induced), GERD and migraines, [facility] with shortness of breath and diffuse edema.   1. ESRD- tolerated second HD session well, 4L off. Electrolytes stable, K slightly low. Significant reduction in weight from 226 lb to 202 today. -Will continue w/ daily HD sessions to help with volume overload -daily weights, strict I/Os -will initiate CLIP process  2. CHF- most recent echo demonstrates EF 40-45% and diffuse hypokinesis, grade 1 diastolic dysfunction -continue current management  3. Anasarca w/ scrotal edema- HD as above, elevate scrotum  4. HTN- stable, continue current medications  5. Hypothyroidism- agree w/ increasing synthroid  6. Tropinemia- likely demand, monitor for chest pain  7. Positive UDS- + benzo, opiates, cocaine. MI in past 2/2 cocaine use. -discussed with patient he would not be able to continue HD if he uses drugs. He verbalized understanding.  8. Anemia- Hgb persistenly <9, 8.8 today -check iron studies  DVT PPX - heparin  Dispo: pending clinical improvement, CLIP   LOS: 4 days   Tillman SersAngela C Marsh Heckler 12/05/2016,8:37 AM

## 2016-12-05 NOTE — Progress Notes (Signed)
Patient C/O generalized pain and discomfort then also rated chest pain @ 2/10 at approximately 2000. O2 @ 4L was started. After assessment and V/S performed NT was informed to get an EKG while this writer went to get Nitro from the [facility]. [facility], patient denied chest pain and refused the Nitro SL and EKG. O2 was decreased to 2L and informed patient to keep O2 on. Continue to monitor patient closely.

## 2016-12-05 NOTE — Plan of Care (Signed)
  Completed/Met Education: Knowledge of General Education information will improve 12/05/2016 0740 - Completed/Met by Evert Kohl, RN Health Behavior/Discharge Planning: Ability to manage health-related needs will improve 12/05/2016 0740 - Completed/Met by Evert Kohl, RN Clinical Measurements: Ability to maintain clinical measurements within normal limits will improve 12/05/2016 0740 - Completed/Met by Evert Kohl, RN Will remain free from infection 12/05/2016 0740 - Completed/Met by Evert Kohl, RN Diagnostic test results will improve 12/05/2016 0740 - Completed/Met by Evert Kohl, RN Respiratory complications will improve 12/05/2016 0740 - Completed/Met by Evert Kohl, RN Cardiovascular complication will be avoided 12/05/2016 0740 - Completed/Met by Evert Kohl, RN Activity: Risk for activity intolerance will decrease 12/05/2016 0740 - Completed/Met by Evert Kohl, RN Nutrition: Adequate nutrition will be maintained 12/05/2016 0740 - Completed/Met by Evert Kohl, RN Coping: Level of anxiety will decrease 12/05/2016 0740 - Completed/Met by Evert Kohl, RN Elimination: Will not experience complications related to bowel motility 12/05/2016 0740 - Completed/Met by Evert Kohl, RN Will not experience complications related to urinary retention 12/05/2016 0740 - Completed/Met by Evert Kohl, RN Pain Managment: General experience of comfort will improve 12/05/2016 0740 - Completed/Met by Evert Kohl, RN Safety: Ability to remain free from injury will improve 12/05/2016 0740 - Completed/Met by Evert Kohl, RN Skin Integrity: Risk for impaired skin integrity will decrease 12/05/2016 0740 - Completed/Met by Evert Kohl, RN Education: Ability to demonstrate management of  disease process will improve 12/05/2016 0740 - Completed/Met by Evert Kohl, RN Ability to verbalize understanding of medication therapies will improve 12/05/2016 0740 - Completed/Met by Evert Kohl, RN Activity: Capacity to carry out activities will improve 12/05/2016 0740 - Completed/Met by Evert Kohl, RN Cardiac: Ability to achieve and maintain adequate cardiopulmonary perfusion will improve 12/05/2016 0740 - Completed/Met by Evert Kohl, RN

## 2016-12-06 DIAGNOSIS — Z79899 Other long term (current) drug therapy: Secondary | ICD-10-CM

## 2016-12-06 DIAGNOSIS — D631 Anemia in chronic kidney disease: Secondary | ICD-10-CM

## 2016-12-06 DIAGNOSIS — Z992 Dependence on renal dialysis: Secondary | ICD-10-CM

## 2016-12-06 DIAGNOSIS — I132 Hypertensive heart and chronic kidney disease with heart failure and with stage 5 chronic kidney disease, or end stage renal disease: Principal | ICD-10-CM

## 2016-12-06 DIAGNOSIS — N186 End stage renal disease: Secondary | ICD-10-CM

## 2016-12-06 DIAGNOSIS — D509 Iron deficiency anemia, unspecified: Secondary | ICD-10-CM

## 2016-12-06 DIAGNOSIS — I5020 Unspecified systolic (congestive) heart failure: Secondary | ICD-10-CM

## 2016-12-06 DIAGNOSIS — I7100 Dissection of unspecified site of aorta: Secondary | ICD-10-CM

## 2016-12-06 DIAGNOSIS — E8779 Other fluid overload: Secondary | ICD-10-CM

## 2016-12-06 DIAGNOSIS — Z79890 Hormone replacement therapy (postmenopausal): Secondary | ICD-10-CM

## 2016-12-06 DIAGNOSIS — E876 Hypokalemia: Secondary | ICD-10-CM

## 2016-12-06 DIAGNOSIS — N432 Other hydrocele: Secondary | ICD-10-CM

## 2016-12-06 DIAGNOSIS — E039 Hypothyroidism, unspecified: Secondary | ICD-10-CM

## 2016-12-06 LAB — OPIATES CONFIRMATION, URINE
CODEINE: NEGATIVE
MORPHINE: POSITIVE — AB
Morphine GC/MS Conf: >3000 ng/mL
OPIATES: POSITIVE — AB

## 2016-12-06 LAB — URINE DRUGS OF ABUSE SCREEN W ALC, ROUTINE (REF LAB)
Amphetamines, Urine: NEGATIVE ng/mL
Barbiturate, Ur: NEGATIVE ng/mL
Benzodiazepine Quant, Ur: NEGATIVE ng/mL
CANNABINOID QUANT UR: NEGATIVE ng/mL
Ethanol U, Quan: NEGATIVE %
PHENCYCLIDINE, UR: NEGATIVE ng/mL
Propoxyphene, Urine: NEGATIVE ng/mL

## 2016-12-06 LAB — RENAL FUNCTION PANEL
Albumin: 2.7 g/dL — ABNORMAL LOW (ref 3.5–5.0)
Anion gap: 9 (ref 5–15)
BUN: 11 mg/dL (ref 6–20)
CALCIUM: 8.2 mg/dL — AB (ref 8.9–10.3)
CO2: 27 mmol/L (ref 22–32)
Chloride: 98 mmol/L — ABNORMAL LOW (ref 101–111)
Creatinine, Ser: 2.36 mg/dL — ABNORMAL HIGH (ref 0.61–1.24)
GFR calc Af Amer: 34 mL/min — ABNORMAL LOW (ref >60)
GFR calc non Af Amer: 30 mL/min — ABNORMAL LOW (ref >60)
GLUCOSE: 157 mg/dL — AB (ref 65–99)
Phosphorus: 3.3 mg/dL (ref 2.5–4.6)
Potassium: 3.2 mmol/L — ABNORMAL LOW (ref 3.5–5.1)
SODIUM: 134 mmol/L — AB (ref 135–145)

## 2016-12-06 LAB — METHADONE CONFIRMATION, URINE
METHADONE: POSITIVE — AB
Methadone Gc/Ms Conf: 670 ng/mL

## 2016-12-06 LAB — COCAINE METABOLITE CONFIRM,UR
Benzoylecgonine GC/MS Conf: 668 ng/mL
Cocaine Metab Quant, Ur: POSITIVE — AB

## 2016-12-06 LAB — HEMOGLOBIN AND HEMATOCRIT, BLOOD
HCT: 29.9 % — ABNORMAL LOW (ref 39.0–52.0)
Hemoglobin: 8.8 g/dL — ABNORMAL LOW (ref 13.0–17.0)

## 2016-12-06 MED ORDER — PENTAFLUOROPROP-TETRAFLUOROETH EX AERO: 1.0000 "application " | INHALATION_SPRAY | CUTANEOUS | Status: DC | PRN

## 2016-12-06 MED ORDER — POTASSIUM CHLORIDE CRYS ER 20 MEQ PO TBCR
40.0000 meq | EXTENDED_RELEASE_TABLET | Freq: Two times a day (BID) | ORAL | Status: AC
  Administered 2016-12-06 (×2): 40 meq via ORAL
  Filled 2016-12-06 (×2): qty 2

## 2016-12-06 MED ORDER — CARVEDILOL 6.25 MG PO TABS
6.2500 mg | ORAL_TABLET | Freq: Two times a day (BID) | ORAL | Status: DC
  Administered 2016-12-06 – 2016-12-07 (×3): 6.25 mg via ORAL
  Filled 2016-12-06 (×3): qty 1

## 2016-12-06 MED ORDER — DARBEPOETIN ALFA 40 MCG/0.4ML IJ SOSY
40.0000 ug | PREFILLED_SYRINGE | INTRAMUSCULAR | Status: DC
  Administered 2016-12-06: 40 ug via INTRAVENOUS
  Filled 2016-12-06: qty 0.4

## 2016-12-06 MED ORDER — DARBEPOETIN ALFA 40 MCG/0.4ML IJ SOSY
PREFILLED_SYRINGE | INTRAMUSCULAR | Status: AC
  Administered 2016-12-06: 40 ug via INTRAVENOUS
  Filled 2016-12-06: qty 0.4

## 2016-12-06 MED ORDER — TRAZODONE HCL 50 MG PO TABS
50.0000 mg | ORAL_TABLET | Freq: Every day | ORAL | Status: DC
  Administered 2016-12-06: 50 mg via ORAL
  Filled 2016-12-06: qty 1

## 2016-12-06 MED ORDER — LOSARTAN POTASSIUM 25 MG PO TABS: 25.0000 mg | ORAL_TABLET | Freq: Every day | ORAL | Status: DC

## 2016-12-06 MED ORDER — HYDRALAZINE HCL 20 MG/ML IJ SOLN
5.0000 mg | Freq: Once | INTRAMUSCULAR | Status: AC
  Administered 2016-12-06: 5 mg via INTRAVENOUS
  Filled 2016-12-06: qty 1

## 2016-12-06 MED ORDER — ALTEPLASE 2 MG IJ SOLR: 2.0000 mg | Freq: Once | INTRAMUSCULAR | Status: DC | PRN

## 2016-12-06 MED ORDER — AMLODIPINE BESYLATE 10 MG PO TABS
10.0000 mg | ORAL_TABLET | Freq: Every day | ORAL | Status: DC
  Administered 2016-12-06: 10 mg via ORAL
  Filled 2016-12-06: qty 1

## 2016-12-06 MED ORDER — HEPARIN SODIUM (PORCINE) 1000 UNIT/ML DIALYSIS
20.0000 [IU]/kg | INTRAMUSCULAR | Status: DC | PRN
  Filled 2016-12-06: qty 2

## 2016-12-06 MED ORDER — LIDOCAINE-PRILOCAINE 2.5-2.5 % EX CREA
1.0000 "application " | TOPICAL_CREAM | CUTANEOUS | Status: DC | PRN
  Filled 2016-12-06: qty 5

## 2016-12-06 MED ORDER — SODIUM CHLORIDE 0.9 % IV SOLN
125.0000 mg | INTRAVENOUS | Status: DC
  Administered 2016-12-06: 125 mg via INTRAVENOUS
  Filled 2016-12-06 (×4): qty 10

## 2016-12-06 MED ORDER — SODIUM CHLORIDE 0.9 % IV SOLN: 100.0000 mL | INTRAVENOUS | Status: DC | PRN

## 2016-12-06 MED ORDER — HEPARIN SODIUM (PORCINE) 1000 UNIT/ML DIALYSIS
1000.0000 [IU] | INTRAMUSCULAR | Status: DC | PRN
  Filled 2016-12-06: qty 1

## 2016-12-06 MED ORDER — HEPARIN SODIUM (PORCINE) 1000 UNIT/ML DIALYSIS
100.0000 [IU]/kg | INTRAMUSCULAR | Status: DC | PRN
  Filled 2016-12-06: qty 9

## 2016-12-06 MED ORDER — HYDRALAZINE HCL 20 MG/ML IJ SOLN: 10.0000 mg | Freq: Four times a day (QID) | INTRAMUSCULAR | Status: DC | PRN

## 2016-12-06 MED ORDER — LIDOCAINE HCL (PF) 1 % IJ SOLN: 5.0000 mL | INTRAMUSCULAR | Status: DC | PRN

## 2016-12-06 NOTE — Procedures (Signed)
I was present at this dialysis session. I have reviewed the session itself and made appropriate changes.   Pt [facility] 2[facility] Shift. Can start on Mon 11/19 (holiday schedule).   Plan for final inpatient HD in AM for UF, then ok with DC thereafter.    Filed Weights   12/05/16 1734 12/06/16 0453 12/06/16 1000  Weight: 87.3 kg (192 lb 7.4 oz) 86.7 kg (191 lb 1.6 oz) 87.2 kg (192 lb 3.9 oz)    Recent Labs  Lab 12/06/16 0351  NA 134*  K 3.2*  CL 98*  CO2 27  GLUCOSE 157*  BUN 11  CREATININE 2.36*  CALCIUM 8.2*  PHOS 3.3    Recent Labs  Lab 12/01/16 0947 12/03/16 0420 12/05/16 0554 12/06/16 0351  WBC 10.1 7.8 7.7  --   NEUTROABS 7.3  --   --   --   HGB 9.1* 8.9* 8.8* 8.8*  HCT 30.3* 28.6* 28.1* 29.9*  MCV 94.7 93.2 92.7  --   PLT 236 229 228  --     Scheduled Meds: . amLODipine  10 mg Oral QHS  . atorvastatin  80 mg Oral q1800  . carvedilol  6.25 mg Oral BID WC  . clopidogrel  75 mg Oral Q breakfast  . darbepoetin (ARANESP) injection - DIALYSIS  40 mcg Intravenous Q Fri-HD  . heparin  5,000 Units Subcutaneous Q8H  . hydrALAZINE  100 mg Oral Q8H  . levothyroxine  75 mcg Oral QAC breakfast  . mupirocin ointment  1 application Nasal BID  . nicotine  21 mg Transdermal Daily  . pantoprazole  40 mg Oral Daily  . potassium chloride  40 mEq Oral Once  . potassium chloride  40 mEq Oral BID  . ramelteon  8 mg Oral QHS  . traZODone  50 mg Oral QHS   Continuous Infusions: . sodium chloride    . sodium chloride    . ferric gluconate (FERRLECIT/NULECIT) IV 125 mg (12/06/16 1326)   PRN Meds:.sodium chloride, sodium chloride, acetaminophen **OR** acetaminophen, alteplase, heparin, heparin, heparin, lidocaine (PF), lidocaine-prilocaine, ondansetron (ZOFRAN) IV, oxyCODONE, pentafluoroprop-tetrafluoroeth   Austin Heckyan Cherly Erno  MD 12/06/2016, 1:55 PM

## 2016-12-06 NOTE — Progress Notes (Signed)
Patient's discharge is anticipated for tomorrow after hemodialysis treatment.  He stated his wife works evening shift that starts at 3pm.  Dialysis department contacted and informed of above.  Staff stated he will be dialyzed 1st round to support him having transportation home, if discharged.

## 2016-12-06 NOTE — Progress Notes (Signed)
Internal Medicine Attending:   I saw and examined the patient. I reviewed the resident's note and I agree with the resident's findings and plan as documented in the resident's note.  Clinically stable today. Tolerating HD and volume overload is improving. BP is still elevated, I agree with adding amlodipine. I think it is fine to start carvedilol as long as patient understands the risk of interaction with cocaine, which the patient denies using.

## 2016-12-06 NOTE — Progress Notes (Signed)
Patient returned from dialysis and all am meds given.

## 2016-12-06 NOTE — Progress Notes (Signed)
Transporter took patient to dialysis via bed transport for Hemodialysis treatment.  [facility] to [facility] in dialysis via telephone.

## 2016-12-06 NOTE — Progress Notes (Signed)
   Subjective: Patient is doing well this AM. He tolerate HD last night with 5 L removed. LE and scrotal swelling are significantly improved. No longer complaining of pain. Wanting to go home. Discussed that nephrology is still working on getting him set up at an outpatient HD center. He will need follow-up for his Type B aortic dissection in December. We will work on better BP control today. All questions and concerns addressed.   Objective: Vital signs in last 24 hours: Vitals:   12/05/16 2227 12/06/16 0154 12/06/16 0336 12/06/16 0453  BP: (!) 195/103 (!) 188/98 (!) 191/93 (!) 185/92  Pulse: 98 89 97 93  Resp:    20  Temp:  98.4 F (36.9 C)  98.4 F (36.9 C)  TempSrc:  Oral  Oral  SpO2:  96%  99%  Weight:    191 lb 1.6 oz (86.7 kg)  Height:       General: Well nourished male in no acute distress Pulm: Good air movement with no wheezing or crackles  CV: RRR, no murmurs, no rubs  Abdomen: Active bowel sounds, soft, no tenderness to palpation  GU: Scrotal swelling and hydrocele significantly improved  Extremities: Mild-moderate pitting LE edema improved from days prior  Assessment/Plan:  1. Volume Overload  - In the setting of HFrEF and CKD Stage V - Net fluid: -12.6L - Patient has now tolerated 3 HD session - HD per nephrology  - Continue strict I&O's, daily weights, and fluid restriction  2.HFrEF  - Recent echo with EF of 40-45% with diffuse hypokinesis and G1DD. Other abnormalities noted include severe left atrial enlargment, right ventricular dilation, and right atrial enlargement -Starting Carvedilol and nephrology started amlodipine  - Continue Atorvastatin  3.ESRD - New to HD, has tolerated 3 HD sessions - Nephrology working to get patient coordinated with outpatient HD - HD per nephro   4. Normocytic Anemia  - Iron deficiency and ESRD  - Nephrology has started Aranesp and iron replacement   5. Type B Aortic Dissection  - Will need CTA or MRI in Dec  for type B aortic dissection F/U (imaging at 3, 6, 12 months)  6. Hydrocele with associated maceration of the left scrotum  -Significantly improved with HD - ContinueOxy IR5 mg every 4 hours as needed -Continue to elevate the scrotum and ambulate  - Will follow up with urology as outpatient   7. Hypertension -ContinueHydralazine to 100mg  TID -Starting Carvedilol and nephrology started amlodipine   8. Hypothyroidism -TSH 18.8, undertreated  - Levothyroxine 75 mcgdaily - Recheck in 4-6 weeks  9. Electrolyte Derangements - Hypokalemia replacing with PO K-dur   Dispo: Anticipated discharge in approximately >1 day(s).   Levora DredgeHelberg, Deforest Maiden, MD 12/06/2016, 6:31 AM My Pager: [phone]

## 2016-12-06 NOTE — Progress Notes (Signed)
[facility] [address] .1st treatment Monday 12/09/2016 need to be there at 11:50 am to sign paperwork .Schedule Tuesday ,Thursday ,Saturday 2nd shift .chairtime 13:00pm

## 2016-12-06 NOTE — Plan of Care (Signed)
Received hemodialysis today.

## 2016-12-06 NOTE — Progress Notes (Signed)
Admit: 12/01/2016 LOS: 5  65M new ESRD, massive hypervolemia  Subjective:  HD yesterday: post weight 87.3kg, 5L UF; tolerated well; Full 4h Treatment Still with edema, scrotal swelling CLIP not yet finalized    11/15 0701 - 11/16 0700 In: 480 [P.O.:480] Out: 6000 [Urine:1000]  Filed Weights   12/05/16 1317 12/05/16 1734 12/06/16 0453  Weight: 93.2 kg (205 lb 7.5 oz) 87.3 kg (192 lb 7.4 oz) 86.7 kg (191 lb 1.6 oz)    Scheduled Meds: . atorvastatin  80 mg Oral q1800  . carvedilol  6.25 mg Oral BID WC  . clopidogrel  75 mg Oral Q breakfast  . heparin  5,000 Units Subcutaneous Q8H  . hydrALAZINE  100 mg Oral Q8H  . levothyroxine  75 mcg Oral QAC breakfast  . mupirocin ointment  1 application Nasal BID  . nicotine  21 mg Transdermal Daily  . pantoprazole  40 mg Oral Daily  . potassium chloride  40 mEq Oral Once  . potassium chloride  40 mEq Oral BID  . ramelteon  8 mg Oral QHS   Continuous Infusions: PRN Meds:.acetaminophen **OR** acetaminophen, ondansetron (ZOFRAN) IV, oxyCODONE  Current Labs: reviewed  11/15 TSAT 20%, Ferritin 129   Physical Exam:  Blood pressure (!) 185/92, pulse 93, temperature 98.4 F (36.9 C), temperature source Oral, resp. rate 20, height 5\' 5"  (1.651 m), weight 86.7 kg (191 lb 1.6 oz), SpO2 99 %. NAD, anxious 3+ LEE Scrotum remains swollen, less so RRR CTAB LUE BVT AVF +B/T R IJ TDC bandaged Soft, protuberant abdomen  A 1. New ESRD 2. Anemia with some Fe deficiency + CKD 3. S/p 1st stage LUE BVT and TDC RIJ 11/13 4. Hypervolemia, improving 5. Hx/o type B aortic dissection 6. HTN, uncontrlled 7. Hx/o CVA, dysphagia 8. ? Hx/o cocaine use 9. CAD  P 1. Cont daily HD for hypervolemia, 5L, 4K, TDC Qb 400, std heparin 2. F/u CLIP 3. Start Aranesp, begin Fe load 4. Add amlodipine qhs 5. Next would add ARB 6. Would avoid BP meds with more than BID dosing, doubt he will be adherent    Austin Heckyan Tyrique Sporn MD 12/06/2016, 8:48 AM  Recent Labs   Lab 12/04/16 0633 12/05/16 0554 12/06/16 0351  NA 136 138 134*  K 3.2* 3.1* 3.2*  CL 102 103 98*  CO2 24 28 27   GLUCOSE 109* 92 157*  BUN 49* 23* 11  CREATININE 4.36* 3.01* 2.36*  CALCIUM 7.9* 8.0* 8.2*  PHOS  --  3.2 3.3   Recent Labs  Lab 12/01/16 0947 12/03/16 0420 12/05/16 0554 12/06/16 0351  WBC 10.1 7.8 7.7  --   NEUTROABS 7.3  --   --   --   HGB 9.1* 8.9* 8.8* 8.8*  HCT 30.3* 28.6* 28.1* 29.9*  MCV 94.7 93.2 92.7  --   PLT 236 229 228  --

## 2016-12-07 LAB — RENAL FUNCTION PANEL
ANION GAP: 7 (ref 5–15)
Albumin: 2.9 g/dL — ABNORMAL LOW (ref 3.5–5.0)
BUN: 11 mg/dL (ref 6–20)
CO2: 28 mmol/L (ref 22–32)
Calcium: 8.4 mg/dL — ABNORMAL LOW (ref 8.9–10.3)
Chloride: 100 mmol/L — ABNORMAL LOW (ref 101–111)
Creatinine, Ser: 2.25 mg/dL — ABNORMAL HIGH (ref 0.61–1.24)
GFR calc Af Amer: 36 mL/min — ABNORMAL LOW (ref >60)
GFR calc non Af Amer: 31 mL/min — ABNORMAL LOW (ref >60)
GLUCOSE: 108 mg/dL — AB (ref 65–99)
POTASSIUM: 4 mmol/L (ref 3.5–5.1)
Phosphorus: 2.5 mg/dL (ref 2.5–4.6)
SODIUM: 135 mmol/L (ref 135–145)

## 2016-12-07 LAB — CBC
HEMATOCRIT: 30.7 % — AB (ref 39.0–52.0)
HEMOGLOBIN: 9.3 g/dL — AB (ref 13.0–17.0)
MCH: 28.5 pg (ref 26.0–34.0)
MCHC: 30.3 g/dL (ref 30.0–36.0)
MCV: 94.2 fL (ref 78.0–100.0)
Platelets: 235 10*3/uL (ref 150–400)
RBC: 3.26 MIL/uL — ABNORMAL LOW (ref 4.22–5.81)
RDW: 17.8 % — AB (ref 11.5–15.5)
WBC: 9.4 10*3/uL (ref 4.0–10.5)

## 2016-12-07 MED ORDER — LOSARTAN POTASSIUM 50 MG PO TABS
50.0000 mg | ORAL_TABLET | Freq: Every day | ORAL | Status: DC
  Administered 2016-12-07: 50 mg via ORAL
  Filled 2016-12-07: qty 1

## 2016-12-07 MED ORDER — HYDRALAZINE HCL 100 MG PO TABS: 100.0000 mg | ORAL_TABLET | Freq: Three times a day (TID) | ORAL | 1 refills | Status: AC

## 2016-12-07 MED ORDER — CARVEDILOL 12.5 MG PO TABS: 12.5000 mg | ORAL_TABLET | Freq: Two times a day (BID) | ORAL | 1 refills | Status: AC

## 2016-12-07 MED ORDER — POTASSIUM CHLORIDE CRYS ER 20 MEQ PO TBCR
40.0000 meq | EXTENDED_RELEASE_TABLET | Freq: Once | ORAL | Status: AC
  Administered 2016-12-07: 40 meq via ORAL
  Filled 2016-12-07: qty 2

## 2016-12-07 MED ORDER — AMLODIPINE BESYLATE 10 MG PO TABS: 10.0000 mg | ORAL_TABLET | Freq: Every day | ORAL | 1 refills | Status: AC

## 2016-12-07 MED ORDER — CARVEDILOL 6.25 MG PO TABS
6.2500 mg | ORAL_TABLET | Freq: Once | ORAL | Status: DC
  Filled 2016-12-07: qty 1

## 2016-12-07 MED ORDER — HEPARIN SODIUM (PORCINE) 1000 UNIT/ML DIALYSIS: 20.0000 [IU]/kg | INTRAMUSCULAR | Status: DC | PRN

## 2016-12-07 MED ORDER — CARVEDILOL 12.5 MG PO TABS: 12.5000 mg | ORAL_TABLET | Freq: Two times a day (BID) | ORAL | Status: DC

## 2016-12-07 MED ORDER — LOSARTAN POTASSIUM 50 MG PO TABS: 50.0000 mg | ORAL_TABLET | Freq: Every day | ORAL | 1 refills | Status: AC

## 2016-12-07 MED ORDER — LEVOTHYROXINE SODIUM 75 MCG PO TABS: 75.0000 ug | ORAL_TABLET | Freq: Every day | ORAL | 1 refills | Status: AC

## 2016-12-07 NOTE — Care Management Note (Signed)
Case Management Note  Patient Details  Name: Austin Simmons MRN: [id number] Date of Birth: September 09, 1960  Subjective/Objective:                 Patient with order to DC to home today. Chart reviewed. No Home Health or Equipment needs, no unacknowledged Case Management consults or medication needs identified at the time of this note. Plan for DC to home. If needs arise today prior to discharge, please call Lawerance SabalDebbie Aleister Lady RN CM at [phone]. CM signing off   Action/Plan:   Expected Discharge Date:  12/07/16               Expected Discharge Plan:  Home/Self Care  In-House Referral:     Discharge planning Services  CM Consult  Post Acute Care Choice:    Choice offered to:     DME Arranged:    DME Agency:     HH Arranged:    HH Agency:     Status of Service:  Completed, signed off  If discussed at [employer] Length of Stay Meetings, dates discussed:    Additional Comments:  Lawerance SabalDebbie Shelva Hetzer, RN 12/07/2016, 11:04 AM

## 2016-12-07 NOTE — Progress Notes (Signed)
BP high despite scheduled and PRN x1 Hydralazine IV 5mg .  Messaged Dr. Renaldo ReelHuang 2x.

## 2016-12-07 NOTE — Progress Notes (Signed)
   Subjective: Seen during HD, tolerating well. Complained of some mild nausea but eating without difficulty. Eager for discharge, threatening to leave AMA if not discharged today. Per nephrology, arranged [facility] Monday [facility]/19 @ 11:50 am. Stable for DC home.   Objective:  Vital signs in last 24 hours: Vitals:   12/07/16 0022 12/07/16 0156 12/07/16 0436 12/07/16 0633  BP: (!) 176/97 (!) 174/82  (!) 187/110  Pulse:   (!) 109   Resp:  20    Temp:    98.2 F (36.8 C)  TempSrc:    Oral  SpO2:  95%  96%  Weight:   181 lb 3.2 oz (82.2 kg)   Height:       General: Well nourished male in no acute distress Pulm: Good air movement with no wheezing or crackles  CV: RRR, no murmurs, no rubs  Abdomen: Active bowel sounds, soft, no tenderness to palpation  GU: Scrotal swelling and hydrocele significantly improved  Extremities: Mild-moderate pitting LE edema improved from days prior  Assessment/Plan:  56 yo M presenting with hypervolemia and progressive renal failure 2/2 uncontrolled HTN, daily NSAID use, and cocaine abuse.  Volume Overload  - In the setting of HFrEF and CKD Stage V - Net fluid: -16.6L total  -Patient has now tolerated 5 HD session - HD per nephrology - Continue strict I&O's, daily weights, and fluid restriction  ESRD -New to HD, has tolerated 5 HD sessions - S/p fistula and temp cath per [facility] on 11/13 - Out[facility] arranged [facility] on Monday 11/19 @ 11:50 am  Hypertension: Persistently uncontrolled 180s - 200 systolic despite addition of new medications.  -ContinueHydralazine to100mg  TID - Continue amlodipine 10 mg - Increased Carvedilol 12.5 mg BID - Started Losartan 50 mg QHS   HFrEF  - Recent echo with EF of 40-45% with diffuse hypokinesis and G1DD. Other abnormalities noted include severe left atrial enlargment, right ventricular dilation, and right atrial enlargement -  Continue Atorvastatin - Volume management per dialysis   Normocytic Anemia  - Iron deficiency and ESRD  - Nephrology has started Aranesp and iron replacement   Type B Aortic Dissection  - Will need CTA or MRI in Dec for type B aortic dissection F/U (imaging at 3, 6, 12 months)  Hydrocele with associated maceration of the left scrotum  -Significantly improved with HD -ContinueOxy IR5 mg every 4 hours as needed -Continue to elevate the scrotum and ambulate  - Will follow up with urology as outpatient  Hypothyroidism -TSH 18.8, undertreated  - Increased levothyroxine 50 ->75 mcgdaily - Recheck in 4-6 weeks   Dispo: Anticipated discharge today.   Reymundo PollGuilloud, Monike Bragdon, MD 12/07/2016, 7:03 AM Pager: [phone]

## 2016-12-07 NOTE — Discharge Summary (Signed)
Name: Austin Simmons MRN: [id number] DOB: 03-Apr-1960 56 y.o. PCP: System, Pcp Not In  Date of Admission: 12/01/2016  8:45 AM Date of Discharge: 12/07/2016 Attending Physician: Dr. Oswaldo Done   Discharge Diagnosis: 1. Dialysis Dependent ESRD 2. HTN 3. Hypothyroidism 4. Polysubstance abuse 5. Scrotal hydrocele   Discharge Medications: Allergies as of 12/07/2016      Reactions   Penicillins Rash   Has patient had a PCN reaction causing immediate rash, facial/tongue/throat swelling, SOB or lightheadedness with hypotension: Yes Has patient had a PCN reaction causing severe rash involving mucus membranes or skin necrosis: No Has patient had a PCN reaction that required hospitalization: No Has patient had a PCN reaction occurring within the last 10 years: No If all of the above answers are "NO", then may proceed with Cephalosporin use.      Medication List    STOP taking these medications   acetaminophen-codeine 300-15 MG tablet Commonly known as:  TYLENOL #2   baclofen 10 MG tablet Commonly known as:  LIORESAL   buPROPion 100 MG tablet Commonly known as:  WELLBUTRIN   cloNIDine 0.1 MG tablet Commonly known as:  CATAPRES   clopidogrel 75 MG tablet Commonly known as:  PLAVIX   metoprolol tartrate 100 MG tablet Commonly known as:  LOPRESSOR   spironolactone 25 MG tablet Commonly known as:  ALDACTONE   traMADol 50 MG tablet Commonly known as:  ULTRAM     TAKE these medications   amLODipine 10 MG tablet Commonly known as:  NORVASC Take 1 tablet (10 mg total) at bedtime by mouth.   atorvastatin 80 MG tablet Commonly known as:  LIPITOR TAKE 1 TABLET BY MOUTH DAILY AT 6 PM   carvedilol 12.5 MG tablet Commonly known as:  COREG Take 1 tablet (12.5 mg total) 2 (two) times daily with a meal by mouth.   hydrALAZINE 100 MG tablet Commonly known as:  APRESOLINE Take 1 tablet (100 mg total) every 8 (eight) hours by mouth. What changed:    medication strength  how  much to take  when to take this   levothyroxine 75 MCG tablet Commonly known as:  SYNTHROID, LEVOTHROID Take 1 tablet (75 mcg total) daily before breakfast by mouth. Start taking on:  12/08/2016 What changed:    medication strength  how much to take  when to take this   losartan 50 MG tablet Commonly known as:  COZAAR Take 1 tablet (50 mg total) at bedtime by mouth. What changed:    medication strength  how much to take  when to take this   nitroGLYCERIN 0.4 MG SL tablet Commonly known as:  NITROSTAT Place 1 tablet (0.4 mg total) under the tongue every 5 (five) minutes as needed. Chest pain   pantoprazole 40 MG tablet Commonly known as:  PROTONIX Take 1 tablet (40 mg total) by mouth daily.       Disposition and follow-up:   Mr.Austin Simmons was discharged from [facility] in Stable condition.  At the hospital follow up visit please address:  1.  ESRD: New to HD, s/p Right IJ TDC and first stage of left upper extremity AV fistula placement on 11/13. Initiated HD 11/13 now s/p 5 dialysis session with total of 21.6 L removed. Weight on discharge was 172 lbs (78.2 kg). Discharged with plans for outpatient dialysis at [facility] on Monday 11/19 @ 11:50 am. Will be T/H/S schedule after thanksgiving holiday schedule.   2. HTN: Persistently uncontrolled despite  escalation of regimen. Sent home on Hydralazine 100 mg TID, amlodipine 10 mg daily, Coreg 12.5 mg BID, and losartan 50 mg daily. Please follow up.   3. Hypothyroidism: TSH 18, Synthroid dose increased 50 -> 75 mcg. Please recheck TSH in 6-8 weeks.    4. Polysubstance abuse: Consider repeat UDS if still making urine. + this admission for cocaine, benzos, and opiates. Adamantly denied use aside from illicit xanax, but confirmatory testing + for cocaine, cocaine metabolites, morphine, and benzodiazepines.   5. Scrotal Hydrocele: Improved with HD. Consider urology referral if persistent.    6.  Labs / imaging needed at time of follow-up: TSH 6-8 weeks, renal function panel   7.  Pending labs/ test needing follow-up: None   Follow-up Appointments: Follow-up Information    Fransisco Hertzhen, Brian L, MD In 6 weeks.   Specialties:  Vascular Surgery, Cardiology Why:  Office will call you to arrange your appt (sent) Contact information: [address] [place] [address] [phone]           Hospital Course by problem list:  1. ESRD: Progressive in the setting of uncontrolled HTN, daily NSAID use, and cocaine abuse. Patient was admitted on 11/11 with complaint of progressive SOB, orthopnea, and chest pain. On arrival to the ED, patient was hypertensive 171/105 with HR 65, RR 18, and oxygenating 99% on RA. Labs were significant for a creatinine of 6.0, BUN of 76, and GFR < 10. BNP was elevated 2,996. CXR was significant for mild pulmonary vascular congestion. Exam was notable JVD to the mandible, and 3+ pitting edema to the mid thigh bilaterally. He had significant scrotal edema and hydrocele. I-stat troponin was 0.11 but EKG was non ischemic. Troponins were trended, peaked at 0.17 and down trended overnight. Likely secondary to volume overload and pulmonary edema. He reported that he had recently run out of his home lasix. He also endorsed daily NSAID use for arthritis pain and UDS was + for cocaine. He was initially started on IV diuresis with suboptimal response and ultimately decided to move forward with dialysis. He had a right IJ TDC and first stage of left upper extremity AV fistula placement on 11/13 per [facility]. HD was initiated the same day. He underwent a total of 5 dialysis session with 21L of fluid removed. Swelling significantly improved including his scrotal hydrocele (although not completely resolved) but 2+ lower extremity edema persisted.  Weight on discharge was 172 lbs (78.2 kg). Dis[facility] Monday 11/19 @ 11:50  am. Will be T/H/S schedule after thanksgiving holiday schedule.   2. HTN: Persistently uncontrolled throughout hospitalization despite escalation of his regimen with increasing dosages and addition of new meds. BP was 203/98 - 188/108 on day of discharge. Sent home on Hydralazine 100 mg TID, amlodipine 10 mg daily, Coreg 12.5 mg BID, and losartan 50 mg daily. Please follow up, he will likely need further titration.   3. Hypothyroidism: Patient reported taking synthroid 50 mcg daily. Unclear who prescribes patient's medication. He did not remember his PCP's name and per the two pharamcy's listed in his chart, patient had not filled any medications in over a year. Synthroid was not one of these medications. TSH was elevated 18.8. He insisted he was compliant. Synthroid was increased to 75 mcg daily. He will need TSH follow up in 6-8 weeks.   4. Polysubstance abuse: UDS was positive for opiates, cocaine, and benzodiazepines. [place] controlled substance database and reported no controlled substance  prescriptions. Confirmatory drug testing was positive for cocaine, cocaine metabolites, and morphine. He adamently denied cocaine use and illicit morphine use but did admit to illicit xanax use prior to admission. Cessation was advised.   5. Scrotal Hydrocele: With diffuse scrotal edema. Ultrasound without evidence of mass or torsion. Improved with HD. Please follow up, consider urology referral if persistent for drainage.   6. 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   Discharge Vitals:   BP (!) 188/108 (BP Location: Right Arm)   Pulse 93   Temp 99.1 F (37.3 C) (Oral)   Resp 20   Ht 5\' 5"  (1.651 m)   Wt 172 lb 6.4 oz (78.2 kg)   SpO2 99%   BMI 28.69 kg/m   Pertinent Labs, Studies, and Procedures:   12/01/2016 CXR 2 View:  IMPRESSION: Markedly enlarged cardiac silhouette which may represent cardiomyopathy or pericardial effusion.  Questionable mild pulmonary vascular  congestion.  12/04/2016 US Scrotum:  IMPRESSION: No evidence of testicular or epididymal masses or torsion or acute inflammation. Small bilateral hydroceles.  Large amount of diffuse scrotal edema.  Discharge Instructions: Discharge Instructions    Call MD for:  difficulty breathing, headache or visual disturbances   Complete by:  As directed    Call MD for:  persistant dizziness or light-headedness   Complete by:  As directed    Call MD for:  redness, tenderness, or signs of infection (pain, swelling, redness, odor or green/yellow discharge around incision site)   Complete by:  As directed    Call MD for:  temperature >100.4   Complete by:  As directed    Diet - low sodium heart healthy   Complete by:  As directed    Discharge instructions   Complete by:  As directed    Mr. [name],  It was a pleasure taking care of you. [facility] are [facility]. It is very important that you keep this appointment. For your blood pressure, you have been discharged on multiple new medications. Please let the nurse know if you have any questions. Please take Hydralazine 100 mg three times a day, amlodipine 10 mg once a day, Carvedilol 12.5 mg twice a day, and losartan 50 mg once a day. New prescriptions have been sent to your pharamcy. I have also increased the dose of your thyroid medicine. Please take synthroid 75 mcg daily before breakfast. Please follow up with your primary care provider in 1-2 weeks. Thank you!  - Dr. Antony ContrasGuilloud   Increase activity slowly   Complete by:  As directed       Signed: Reymundo PollGuilloud, Biana Haggar, MD 12/07/2016, 6:50 PM   Pager: [phone]

## 2016-12-07 NOTE — Procedures (Signed)
I was present at this dialysis session. I have reviewed the session itself and made appropriate changes.   4K bath. UF goal 4L.  Edema much improved.  Down ?40lb from admission.    BP remains up.  Add losartan to amlodipine.    [facility] and this is holiday schedule. Afte rwill be THS week after holiday.   Cocaine was + on confirmatory testing   Filed Weights   12/06/16 1400 12/07/16 0436 12/07/16 0745  Weight: 82.3 kg (181 lb 7 oz) 82.2 kg (181 lb 3.2 oz) 82.8 kg (182 lb 8.7 oz)    Recent Labs  Lab 12/07/16 0615  NA 135  K 4.0  CL 100*  CO2 28  GLUCOSE 108*  BUN 11  CREATININE 2.25*  CALCIUM 8.4*  PHOS 2.5    Recent Labs  Lab 12/01/16 0947 12/03/16 0420 12/05/16 0554 12/06/16 0351 12/07/16 0615  WBC 10.1 7.8 7.7  --  9.4  NEUTROABS 7.3  --   --   --   --   HGB 9.1* 8.9* 8.8* 8.8* 9.3*  HCT 30.3* 28.6* 28.1* 29.9* 30.7*  MCV 94.7 93.2 92.7  --  94.2  PLT 236 229 228  --  235    Scheduled Meds: . amLODipine  10 mg Oral QHS  . atorvastatin  80 mg Oral q1800  . carvedilol  12.5 mg Oral BID WC  . carvedilol  6.25 mg Oral Once  . clopidogrel  75 mg Oral Q breakfast  . darbepoetin (ARANESP) injection - DIALYSIS  40 mcg Intravenous Q Fri-HD  . heparin  5,000 Units Subcutaneous Q8H  . hydrALAZINE  100 mg Oral Q8H  . levothyroxine  75 mcg Oral QAC breakfast  . nicotine  21 mg Transdermal Daily  . pantoprazole  40 mg Oral Daily  . potassium chloride  40 mEq Oral Once  . ramelteon  8 mg Oral QHS  . traZODone  50 mg Oral QHS   Continuous Infusions: . sodium chloride    . sodium chloride    . ferric gluconate (FERRLECIT/NULECIT) IV Stopped (12/06/16 1604)   PRN Meds:.sodium chloride, sodium chloride, acetaminophen **OR** acetaminophen, alteplase, heparin, heparin, heparin, heparin, lidocaine (PF), lidocaine-prilocaine, ondansetron (ZOFRAN) IV, oxyCODONE, pentafluoroprop-tetrafluoroeth   Sabra Heckyan Sanford  MD 12/07/2016, 9:04 AM

## 2016-12-07 NOTE — Progress Notes (Signed)
Discharge instructions, RX's and follow ups explained and provided to patient verbalized understanding. Patient left floor via wheelchair accompanied by staff  No c/o pain or shortness of breath at d/c.  Kol Consuegra Lynn, RN  

## 2016-12-08 LAB — PARATHYROID HORMONE, INTACT (NO CA): PTH: 64 pg/mL (ref 15–65)

## 2016-12-09 ENCOUNTER — Other Ambulatory Visit: Payer: Self-pay | Admitting: Internal Medicine

## 2016-12-09 DIAGNOSIS — N2581 Secondary hyperparathyroidism of renal origin: Secondary | ICD-10-CM | POA: Diagnosis not present

## 2016-12-09 DIAGNOSIS — D509 Iron deficiency anemia, unspecified: Secondary | ICD-10-CM | POA: Diagnosis not present

## 2016-12-09 DIAGNOSIS — T8249XA Other complication of vascular dialysis catheter, initial encounter: Secondary | ICD-10-CM | POA: Diagnosis not present

## 2016-12-09 DIAGNOSIS — D631 Anemia in chronic kidney disease: Secondary | ICD-10-CM | POA: Diagnosis not present

## 2016-12-09 DIAGNOSIS — N186 End stage renal disease: Secondary | ICD-10-CM | POA: Diagnosis not present

## 2016-12-09 DIAGNOSIS — D688 Other specified coagulation defects: Secondary | ICD-10-CM | POA: Diagnosis not present

## 2016-12-14 DIAGNOSIS — D509 Iron deficiency anemia, unspecified: Secondary | ICD-10-CM | POA: Diagnosis not present

## 2016-12-14 DIAGNOSIS — N2581 Secondary hyperparathyroidism of renal origin: Secondary | ICD-10-CM | POA: Diagnosis not present

## 2016-12-14 DIAGNOSIS — N186 End stage renal disease: Secondary | ICD-10-CM | POA: Diagnosis not present

## 2016-12-14 DIAGNOSIS — T8249XA Other complication of vascular dialysis catheter, initial encounter: Secondary | ICD-10-CM | POA: Diagnosis not present

## 2016-12-14 DIAGNOSIS — D631 Anemia in chronic kidney disease: Secondary | ICD-10-CM | POA: Diagnosis not present

## 2016-12-14 DIAGNOSIS — D688 Other specified coagulation defects: Secondary | ICD-10-CM | POA: Diagnosis not present

## 2016-12-17 DIAGNOSIS — D509 Iron deficiency anemia, unspecified: Secondary | ICD-10-CM | POA: Diagnosis not present

## 2016-12-17 DIAGNOSIS — D688 Other specified coagulation defects: Secondary | ICD-10-CM | POA: Diagnosis not present

## 2016-12-17 DIAGNOSIS — N2581 Secondary hyperparathyroidism of renal origin: Secondary | ICD-10-CM | POA: Diagnosis not present

## 2016-12-17 DIAGNOSIS — N186 End stage renal disease: Secondary | ICD-10-CM | POA: Diagnosis not present

## 2016-12-17 DIAGNOSIS — D631 Anemia in chronic kidney disease: Secondary | ICD-10-CM | POA: Diagnosis not present

## 2016-12-17 DIAGNOSIS — T8249XA Other complication of vascular dialysis catheter, initial encounter: Secondary | ICD-10-CM | POA: Diagnosis not present

## 2016-12-20 DIAGNOSIS — N186 End stage renal disease: Secondary | ICD-10-CM | POA: Diagnosis not present

## 2016-12-20 DIAGNOSIS — I129 Hypertensive chronic kidney disease with stage 1 through stage 4 chronic kidney disease, or unspecified chronic kidney disease: Secondary | ICD-10-CM | POA: Diagnosis not present

## 2016-12-20 DIAGNOSIS — Z992 Dependence on renal dialysis: Secondary | ICD-10-CM | POA: Diagnosis not present

## 2016-12-21 DIAGNOSIS — N186 End stage renal disease: Secondary | ICD-10-CM | POA: Diagnosis not present

## 2016-12-21 DIAGNOSIS — D688 Other specified coagulation defects: Secondary | ICD-10-CM | POA: Diagnosis not present

## 2016-12-21 DIAGNOSIS — D631 Anemia in chronic kidney disease: Secondary | ICD-10-CM | POA: Diagnosis not present

## 2016-12-21 DIAGNOSIS — D509 Iron deficiency anemia, unspecified: Secondary | ICD-10-CM | POA: Diagnosis not present

## 2016-12-21 DIAGNOSIS — T8249XA Other complication of vascular dialysis catheter, initial encounter: Secondary | ICD-10-CM | POA: Diagnosis not present

## 2016-12-21 DIAGNOSIS — N2581 Secondary hyperparathyroidism of renal origin: Secondary | ICD-10-CM | POA: Diagnosis not present

## 2016-12-21 DIAGNOSIS — Z23 Encounter for immunization: Secondary | ICD-10-CM | POA: Diagnosis not present

## 2016-12-23 ENCOUNTER — Ambulatory Visit: Payer: Medicare Other | Admitting: Thoracic Surgery (Cardiothoracic Vascular Surgery)

## 2016-12-23 NOTE — Op Note (Addendum)
OPERATIVE NOTE  PROCEDURE: 1.  Right internal jugular vein tunneled dialysis catheter placement 2.  Right internal jugular vein cannulation under ultrasound guidance 3.  Left first stage basilic vein transposition   PRE-OPERATIVE DIAGNOSIS: end-stage renal failure  POST-OPERATIVE DIAGNOSIS: same as above  SURGEON: Adele Barthel, MD  ANESTHESIA: local and MAC  ESTIMATED BLOOD LOSS: 30 cc  FINDING(S): 1.  Tips of the catheter in the right atrium on fluoroscopy 2.  No obvious pneumothorax on fluoroscopy 3.  Faintly palpable radial pulse at end of case 4.  Palpable thrill in arteriovenous fistula at end of case  SPECIMEN(S):  none  INDICATIONS:   Austin Simmons is a 56 y.o. male who presents with hemodialysis dependence.  The patient presents for tunneled dialysis catheter placement.  The patient is aware the risks of tunneled dialysis catheter placement include but are not limited to: bleeding, infection, central venous injury, pneumothorax, possible venous stenosis, possible malpositioning in the venous system, and possible infections related to long-term catheter presence.  The patient was aware of these risks and agreed to proceed.   DESCRIPTION: After written full informed consent was obtained from the patient, the patient was taken back to the operating room.  Prior to induction, the patient was given IV antibiotics.  After obtaining adequate sedation, the patient was prepped and draped in the standard fashion for a chest or neck tunneled dialysis catheter placement.     The cannulation site, the catheter exit site, and tract for the subcutaneous tunnel were then anesthestized with a total of 16 cc of 1% lidocaine without epinephrine.  Under ultrasound guidance, the right internal jugular vein was cannulated with the 18 gauge needle.  A J-wire was then placed down into the inferior vena cava under fluoroscopic guidance.  The wire was then secured in place with a clamp to the  drapes.  The tapered wire guide was left occluding the lumen of the needle to avoid air embolism.    I then made stab incisions at the neck and exit sites.   I dissected from the exit site to the cannulation site with a tunneler.   The wire was then unclamped and I removed the needle.  The skin tract and venotomy was dilated serially with graduated dilators, passed under fluoroscopic guidance.   Finally, the dilator-sheath was placed under fluoroscopic guidance into the superior vena cava.  The central dilator and wire were removed.  A 23 cm Palindrome catheter was placed under fluoroscopic guidance down into the right atrium.    The sheath was broken and peeled away while holding the catheter cuff at the level of the skin.  The catheter was clamped with the plastic clamp and the back end of this catheter was transected.  One lumen was docked onto the tunneler.  The catheter was delivered through the subcutaneous tunnel by pulling the tunneler out the exit site.  The catheter was reclamped and was transected a second time, revealing the two lumens of this catheter.  The catheter collar was loaded over the back end of the catheter.  The two  ports were docked onto these two lumens.  The catheter collar was then snapped into place, securing the two ports.  Each port was tested by aspirating and flushing.  No resistance was noted.  Each port was then thoroughly flushed with heparinized saline.  The catheter was secured in placed with two interrupted stitches of 3-0 Nylon tied to the catheter.  The neck incision was  closed with a U-stitch of 4-0 Monocryl.  The neck and chest incision were cleaned.  The closed neck incision was reinforced with Dermabond and sterile bandages applied to the catheter exit site.  Each port was then loaded with concentrated heparin (1000 Units/mL) at the manufacturer recommended volumes to each port.  Sterile caps were applied to each port.    On completion fluoroscopy, the tips of the  catheter were in the right atrium, and there was no evidence of pneumothorax.  At this point, the drapes were taken down.  The patient was repositioned for a left arm access procedure.  He was reprepped and redraped.  Attention was turned to the left arm.  I scanned the left arm with the Sonosite.  There was no usable cephalic vein visualized.  There a cubital vein that was larger than the basilic vein.  I elected to construct a left first stage basilic vein transposition using the cubital vein.  I made a longitudinal incision over the brachial artery.  In the process of dissecting out the brachial artery, I dissected out the cubital vein which appeared compressible with minimal scar tissue surrounding it.  The vein appeared to be externally 3-4 mm in diameter.  I then dissected out the brachial artery proximally and distally.  I placed vessel loops for control.  I ligated the cubital vein distally.  The vein allowed a 5 mm dilator to pass easily.  I clamped the vein proximally and inject heparinized saline.  I made an arteriotomy in the brachial artery.  I injected heparinized saline proximally and distally.  I sewed the vein to the artery in an end-to-side configuration with a running stitch of 6-0 Prolene.  Prior completion, I backbled all vessels.  No thrombus was noted.  I completed the anastomosis in the usual fashion.  There was no bleeding from the suture line.  All vessel loops were removed and clamps removed.  Immediately there was a strong thrill in the cubital vein.  Distally, I could faintly feel a radial pulse.  The wound was washed out and no active bleeding was noted.  The subcutaneous tissue was reapproximated with a running stitch of 3-0 Vicryl.  The skin was reapproximated with a running stitch of 4-0 Monocryl.  The skin was cleaned, dried, and reinforced with Dermabond.   COMPLICATIONS: none  CONDITION: stable   Adele Barthel, MD, [facility] [place] Office:  [phone] Pager: [phone]  12/03/2016, 3:56 PM

## 2016-12-24 DIAGNOSIS — D509 Iron deficiency anemia, unspecified: Secondary | ICD-10-CM | POA: Diagnosis not present

## 2016-12-24 DIAGNOSIS — N2581 Secondary hyperparathyroidism of renal origin: Secondary | ICD-10-CM | POA: Diagnosis not present

## 2016-12-24 DIAGNOSIS — T8249XA Other complication of vascular dialysis catheter, initial encounter: Secondary | ICD-10-CM | POA: Diagnosis not present

## 2016-12-24 DIAGNOSIS — D631 Anemia in chronic kidney disease: Secondary | ICD-10-CM | POA: Diagnosis not present

## 2016-12-24 DIAGNOSIS — D688 Other specified coagulation defects: Secondary | ICD-10-CM | POA: Diagnosis not present

## 2016-12-24 DIAGNOSIS — Z23 Encounter for immunization: Secondary | ICD-10-CM | POA: Diagnosis not present

## 2016-12-24 DIAGNOSIS — N186 End stage renal disease: Secondary | ICD-10-CM | POA: Diagnosis not present

## 2016-12-26 DIAGNOSIS — N186 End stage renal disease: Secondary | ICD-10-CM | POA: Diagnosis not present

## 2016-12-26 DIAGNOSIS — T8249XA Other complication of vascular dialysis catheter, initial encounter: Secondary | ICD-10-CM | POA: Diagnosis not present

## 2016-12-26 DIAGNOSIS — D509 Iron deficiency anemia, unspecified: Secondary | ICD-10-CM | POA: Diagnosis not present

## 2016-12-26 DIAGNOSIS — N2581 Secondary hyperparathyroidism of renal origin: Secondary | ICD-10-CM | POA: Diagnosis not present

## 2016-12-26 DIAGNOSIS — D631 Anemia in chronic kidney disease: Secondary | ICD-10-CM | POA: Diagnosis not present

## 2016-12-26 DIAGNOSIS — Z23 Encounter for immunization: Secondary | ICD-10-CM | POA: Diagnosis not present

## 2016-12-26 DIAGNOSIS — D688 Other specified coagulation defects: Secondary | ICD-10-CM | POA: Diagnosis not present

## 2016-12-31 DIAGNOSIS — Z23 Encounter for immunization: Secondary | ICD-10-CM | POA: Diagnosis not present

## 2016-12-31 DIAGNOSIS — D631 Anemia in chronic kidney disease: Secondary | ICD-10-CM | POA: Diagnosis not present

## 2016-12-31 DIAGNOSIS — D688 Other specified coagulation defects: Secondary | ICD-10-CM | POA: Diagnosis not present

## 2016-12-31 DIAGNOSIS — T8249XA Other complication of vascular dialysis catheter, initial encounter: Secondary | ICD-10-CM | POA: Diagnosis not present

## 2016-12-31 DIAGNOSIS — N2581 Secondary hyperparathyroidism of renal origin: Secondary | ICD-10-CM | POA: Diagnosis not present

## 2016-12-31 DIAGNOSIS — N186 End stage renal disease: Secondary | ICD-10-CM | POA: Diagnosis not present

## 2016-12-31 DIAGNOSIS — D509 Iron deficiency anemia, unspecified: Secondary | ICD-10-CM | POA: Diagnosis not present

## 2016-12-31 NOTE — Progress Notes (Deleted)
    Postoperative Access Visit   History of Present Illness   Renee RivalJames E Scouten is a 56 y.o. year old male who presents for postoperative follow-up for: left first stage basilic vein transposition, RIJV TDC (Date: 12/03/16).  The patient's wounds are *** healed.  The patient notes *** steal symptoms.  The patient is *** able to complete their activities of daily living.  The patient's current symptoms are: ***.   Physical Examination  ***There were no vitals filed for this visit. ***There is no height or weight on file to calculate BMI.  left arm Incision is *** healed, skin feels ***, hand grip is ***/5, sensation in digits is *** intact, ***palpable thrill, bruit can *** be auscultated     Medical Decision Making   Renee RivalJames E Mistry is a 56 y.o. year old male who presents s/p left first stage basilic vein transposition, R IJV TDC   The patient's access is *** ready for use.  ***The patient's tunneled dialysis catheter can be removed after two successful cannulations and completed dialysis treatments.  Thank you for allowing us to participate in this patient's care.   Leonides SakeBrian Stoy Fenn, MD, FACS [facility] [place] Office: [phone] Pager: [phone]

## 2017-01-02 DIAGNOSIS — T8249XA Other complication of vascular dialysis catheter, initial encounter: Secondary | ICD-10-CM | POA: Diagnosis not present

## 2017-01-02 DIAGNOSIS — D509 Iron deficiency anemia, unspecified: Secondary | ICD-10-CM | POA: Diagnosis not present

## 2017-01-02 DIAGNOSIS — D688 Other specified coagulation defects: Secondary | ICD-10-CM | POA: Diagnosis not present

## 2017-01-02 DIAGNOSIS — Z23 Encounter for immunization: Secondary | ICD-10-CM | POA: Diagnosis not present

## 2017-01-02 DIAGNOSIS — N186 End stage renal disease: Secondary | ICD-10-CM | POA: Diagnosis not present

## 2017-01-02 DIAGNOSIS — N2581 Secondary hyperparathyroidism of renal origin: Secondary | ICD-10-CM | POA: Diagnosis not present

## 2017-01-02 DIAGNOSIS — D631 Anemia in chronic kidney disease: Secondary | ICD-10-CM | POA: Diagnosis not present

## 2017-01-03 ENCOUNTER — Encounter: Payer: Medicare Other | Admitting: Vascular Surgery

## 2017-01-04 DIAGNOSIS — N2581 Secondary hyperparathyroidism of renal origin: Secondary | ICD-10-CM | POA: Diagnosis not present

## 2017-01-04 DIAGNOSIS — D509 Iron deficiency anemia, unspecified: Secondary | ICD-10-CM | POA: Diagnosis not present

## 2017-01-04 DIAGNOSIS — N186 End stage renal disease: Secondary | ICD-10-CM | POA: Diagnosis not present

## 2017-01-04 DIAGNOSIS — T8249XA Other complication of vascular dialysis catheter, initial encounter: Secondary | ICD-10-CM | POA: Diagnosis not present

## 2017-01-04 DIAGNOSIS — D688 Other specified coagulation defects: Secondary | ICD-10-CM | POA: Diagnosis not present

## 2017-01-04 DIAGNOSIS — D631 Anemia in chronic kidney disease: Secondary | ICD-10-CM | POA: Diagnosis not present

## 2017-01-04 DIAGNOSIS — Z23 Encounter for immunization: Secondary | ICD-10-CM | POA: Diagnosis not present

## 2017-01-07 DIAGNOSIS — D688 Other specified coagulation defects: Secondary | ICD-10-CM | POA: Diagnosis not present

## 2017-01-07 DIAGNOSIS — N186 End stage renal disease: Secondary | ICD-10-CM | POA: Diagnosis not present

## 2017-01-07 DIAGNOSIS — D509 Iron deficiency anemia, unspecified: Secondary | ICD-10-CM | POA: Diagnosis not present

## 2017-01-07 DIAGNOSIS — N2581 Secondary hyperparathyroidism of renal origin: Secondary | ICD-10-CM | POA: Diagnosis not present

## 2017-01-07 DIAGNOSIS — Z23 Encounter for immunization: Secondary | ICD-10-CM | POA: Diagnosis not present

## 2017-01-07 DIAGNOSIS — T8249XA Other complication of vascular dialysis catheter, initial encounter: Secondary | ICD-10-CM | POA: Diagnosis not present

## 2017-01-07 DIAGNOSIS — D631 Anemia in chronic kidney disease: Secondary | ICD-10-CM | POA: Diagnosis not present

## 2017-01-09 DIAGNOSIS — N2581 Secondary hyperparathyroidism of renal origin: Secondary | ICD-10-CM | POA: Diagnosis not present

## 2017-01-09 DIAGNOSIS — N186 End stage renal disease: Secondary | ICD-10-CM | POA: Diagnosis not present

## 2017-01-09 DIAGNOSIS — D688 Other specified coagulation defects: Secondary | ICD-10-CM | POA: Diagnosis not present

## 2017-01-09 DIAGNOSIS — Z23 Encounter for immunization: Secondary | ICD-10-CM | POA: Diagnosis not present

## 2017-01-09 DIAGNOSIS — D631 Anemia in chronic kidney disease: Secondary | ICD-10-CM | POA: Diagnosis not present

## 2017-01-09 DIAGNOSIS — D509 Iron deficiency anemia, unspecified: Secondary | ICD-10-CM | POA: Diagnosis not present

## 2017-01-09 DIAGNOSIS — T8249XA Other complication of vascular dialysis catheter, initial encounter: Secondary | ICD-10-CM | POA: Diagnosis not present

## 2017-01-18 DIAGNOSIS — T8249XA Other complication of vascular dialysis catheter, initial encounter: Secondary | ICD-10-CM | POA: Diagnosis not present

## 2017-01-18 DIAGNOSIS — Z23 Encounter for immunization: Secondary | ICD-10-CM | POA: Diagnosis not present

## 2017-01-18 DIAGNOSIS — N186 End stage renal disease: Secondary | ICD-10-CM | POA: Diagnosis not present

## 2017-01-18 DIAGNOSIS — N2581 Secondary hyperparathyroidism of renal origin: Secondary | ICD-10-CM | POA: Diagnosis not present

## 2017-01-18 DIAGNOSIS — D688 Other specified coagulation defects: Secondary | ICD-10-CM | POA: Diagnosis not present

## 2017-01-18 DIAGNOSIS — D509 Iron deficiency anemia, unspecified: Secondary | ICD-10-CM | POA: Diagnosis not present

## 2017-01-18 DIAGNOSIS — D631 Anemia in chronic kidney disease: Secondary | ICD-10-CM | POA: Diagnosis not present

## 2017-01-20 DIAGNOSIS — Z992 Dependence on renal dialysis: Secondary | ICD-10-CM | POA: Diagnosis not present

## 2017-01-20 DIAGNOSIS — I129 Hypertensive chronic kidney disease with stage 1 through stage 4 chronic kidney disease, or unspecified chronic kidney disease: Secondary | ICD-10-CM | POA: Diagnosis not present

## 2017-01-20 DIAGNOSIS — N186 End stage renal disease: Secondary | ICD-10-CM | POA: Diagnosis not present

## 2017-01-21 DIAGNOSIS — I129 Hypertensive chronic kidney disease with stage 1 through stage 4 chronic kidney disease, or unspecified chronic kidney disease: Secondary | ICD-10-CM | POA: Diagnosis not present

## 2017-01-21 DIAGNOSIS — Z992 Dependence on renal dialysis: Secondary | ICD-10-CM | POA: Diagnosis not present

## 2017-01-21 DIAGNOSIS — N186 End stage renal disease: Secondary | ICD-10-CM | POA: Diagnosis not present

## 2017-01-23 DIAGNOSIS — D688 Other specified coagulation defects: Secondary | ICD-10-CM | POA: Diagnosis not present

## 2017-01-23 DIAGNOSIS — T8249XA Other complication of vascular dialysis catheter, initial encounter: Secondary | ICD-10-CM | POA: Diagnosis not present

## 2017-01-23 DIAGNOSIS — N2581 Secondary hyperparathyroidism of renal origin: Secondary | ICD-10-CM | POA: Diagnosis not present

## 2017-01-23 DIAGNOSIS — N186 End stage renal disease: Secondary | ICD-10-CM | POA: Diagnosis not present

## 2017-01-23 DIAGNOSIS — D509 Iron deficiency anemia, unspecified: Secondary | ICD-10-CM | POA: Diagnosis not present

## 2017-01-23 NOTE — Progress Notes (Deleted)
    Postoperative Access Visit   History of Present Illness   Austin Simmons is a 57 y.o. year old male who presents for postoperative follow-up for: left first stage basilic vein transposition (Date: 12/03/16).  The patient's wounds are *** healed.  The patient notes *** steal symptoms.  The patient is *** able to complete their activities of daily living.  The patient's current symptoms are: ***.   Physical Examination  ***There were no vitals filed for this visit. ***There is no height or weight on file to calculate BMI.  {side of body:[id number]} arm Incision is *** healed, skin feels ***, hand grip is ***/5, sensation in digits is *** intact, ***palpable thrill, bruit can *** be auscultated     Medical Decision Making   Austin Simmons is a 57 y.o. year old male who presents s/p left first stage basilic vein transposition   The patient's access is *** ready for use.  ***The patient's tunneled dialysis catheter can be removed after two successful cannulations and completed dialysis treatments.  Thank you for allowing us to participate in this patient's care.   Leonides SakeBrian Chen, MD, FACS [facility] [place] Office: [phone] Pager: [phone]

## 2017-01-24 ENCOUNTER — Encounter: Payer: Medicare Other | Admitting: Vascular Surgery

## 2017-02-21 DEATH — deceased
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
